# Patient Record
Sex: Male | Born: 1940 | Race: White | Hispanic: No | Marital: Married | Smoking: Former smoker
Health system: Southern US, Community
[De-identification: ages and names within clinical notes are randomized; demographics above are authoritative.]

## PROBLEM LIST (undated history)

## (undated) DIAGNOSIS — J449 Chronic obstructive pulmonary disease, unspecified: Secondary | ICD-10-CM

## (undated) DIAGNOSIS — E039 Hypothyroidism, unspecified: Secondary | ICD-10-CM

## (undated) DIAGNOSIS — J189 Pneumonia, unspecified organism: Secondary | ICD-10-CM

## (undated) DIAGNOSIS — M1990 Unspecified osteoarthritis, unspecified site: Secondary | ICD-10-CM

## (undated) DIAGNOSIS — A770 Spotted fever due to Rickettsia rickettsii: Secondary | ICD-10-CM

## (undated) DIAGNOSIS — E119 Type 2 diabetes mellitus without complications: Secondary | ICD-10-CM

## (undated) DIAGNOSIS — Z87442 Personal history of urinary calculi: Secondary | ICD-10-CM

## (undated) DIAGNOSIS — K56609 Unspecified intestinal obstruction, unspecified as to partial versus complete obstruction: Secondary | ICD-10-CM

## (undated) DIAGNOSIS — C801 Malignant (primary) neoplasm, unspecified: Secondary | ICD-10-CM

## (undated) HISTORY — PX: TONSILLECTOMY: SUR1361

---

## 2001-07-09 ENCOUNTER — Encounter: Admission: RE | Admit: 2001-07-09 | Discharge: 2001-07-09 | Payer: Self-pay | Admitting: Family Medicine

## 2001-07-09 ENCOUNTER — Encounter: Payer: Self-pay | Admitting: Family Medicine

## 2004-07-03 ENCOUNTER — Ambulatory Visit (HOSPITAL_COMMUNITY): Admission: RE | Admit: 2004-07-03 | Discharge: 2004-07-03 | Payer: Self-pay | Admitting: Cardiology

## 2004-07-03 ENCOUNTER — Ambulatory Visit: Payer: Self-pay | Admitting: Cardiology

## 2004-08-02 ENCOUNTER — Ambulatory Visit: Payer: Self-pay | Admitting: Cardiology

## 2012-01-22 ENCOUNTER — Emergency Department (HOSPITAL_COMMUNITY)
Admission: EM | Admit: 2012-01-22 | Discharge: 2012-01-23 | Disposition: A | Discharge status: Home / Self Care | Payer: Medicare Other | Attending: Emergency Medicine | Admitting: Emergency Medicine

## 2012-01-22 ENCOUNTER — Encounter (HOSPITAL_COMMUNITY): Payer: Self-pay | Admitting: *Deleted

## 2012-01-22 DIAGNOSIS — L253 Unspecified contact dermatitis due to other chemical products: Secondary | ICD-10-CM

## 2012-01-22 MED ORDER — ERYTHROMYCIN 5 MG/GM OP OINT
TOPICAL_OINTMENT | Freq: Once | OPHTHALMIC | Status: AC
  Administered 2012-01-22: 23:00:00 via OPHTHALMIC
  Filled 2012-01-22: qty 3.5

## 2012-01-22 MED ORDER — PREDNISONE 20 MG PO TABS: ORAL_TABLET | ORAL | Status: DC

## 2012-01-22 MED ORDER — PREDNISONE 20 MG PO TABS
60.0000 mg | ORAL_TABLET | Freq: Once | ORAL | Status: AC
  Administered 2012-01-22: 60 mg via ORAL
  Filled 2012-01-22: qty 3

## 2012-01-22 NOTE — ED Notes (Signed)
Pt reports pain, burning and increased tearing in eyes.  Redness and watering noted.

## 2012-01-22 NOTE — Discharge Instructions (Signed)
Contact Dermatitis Contact dermatitis is a reaction to certain substances that touch the skin. Contact dermatitis can be either irritant contact dermatitis or allergic contact dermatitis. Irritant contact dermatitis does not require previous exposure to the substance for a reaction to occur.Allergic contact dermatitis only occurs if you have been exposed to the substance before. Upon a repeat exposure, your body reacts to the substance.  CAUSES  Many substances can cause contact dermatitis. Irritant dermatitis is most commonly caused by repeated exposure to mildly irritating substances, such as:  Makeup.   Soaps.   Detergents.   Bleaches.   Acids.   Metal salts, such as nickel.  Allergic contact dermatitis is most commonly caused by exposure to:  Poisonous plants.   Chemicals (deodorants, shampoos).   Jewelry.   Latex.   Neomycin in triple antibiotic cream.   Preservatives in products, including clothing.  SYMPTOMS  The area of skin that is exposed may develop:  Dryness or flaking.   Redness.   Cracks.   Itching.   Pain or a burning sensation.   Blisters.  With allergic contact dermatitis, there may also be swelling in areas such as the eyelids, mouth, or genitals.  DIAGNOSIS  Your caregiver can usually tell what the problem is by doing a physical exam. In cases where the cause is uncertain and an allergic contact dermatitis is suspected, a patch skin test may be performed to help determine the cause of your dermatitis. TREATMENT Treatment includes protecting the skin from further contact with the irritating substance by avoiding that substance if possible. Barrier creams, powders, and gloves may be helpful. Your caregiver may also recommend:  Steroid creams or ointments applied 2 times daily. For best results, soak the rash area in cool water for 20 minutes. Then apply the medicine. Cover the area with a plastic wrap. You can store the steroid cream in the  refrigerator for a "chilly" effect on your rash. That may decrease itching. Oral steroid medicines may be needed in more severe cases.   Antibiotics or antibacterial ointments if a skin infection is present.   Antihistamine lotion or an antihistamine taken by mouth to ease itching.   Lubricants to keep moisture in your skin.   Burow's solution to reduce redness and soreness or to dry a weeping rash. Mix one packet or tablet of solution in 2 cups cool water. Dip a clean washcloth in the mixture, wring it out a bit, and put it on the affected area. Leave the cloth in place for 30 minutes. Do this as often as possible throughout the day.   Taking several cornstarch or baking soda baths daily if the area is too large to cover with a washcloth.  Harsh chemicals, such as alkalis or acids, can cause skin damage that is like a burn. You should flush your skin for 15 to 20 minutes with cold water after such an exposure. You should also seek immediate medical care after exposure. Bandages (dressings), antibiotics, and pain medicine may be needed for severely irritated skin.  HOME CARE INSTRUCTIONS  Avoid the substance that caused your reaction.   Keep the area of skin that is affected away from hot water, soap, sunlight, chemicals, acidic substances, or anything else that would irritate your skin.   Do not scratch the rash. Scratching may cause the rash to become infected.   You may take cool baths to help stop the itching.   Only take over-the-counter or prescription medicines as directed by your caregiver.     See your caregiver for follow-up care as directed to make sure your skin is healing properly.  SEEK MEDICAL CARE IF:   Your condition is not better after 3 days of treatment.   You seem to be getting worse.   You see signs of infection such as swelling, tenderness, redness, soreness, or warmth in the affected area.   You have any problems related to your medicines.  Document Released:  07/12/2000 Document Revised: 07/04/2011 Document Reviewed: 12/18/2010 [facility] Patient Information 2012 [place], [place].   Apply the antibiotic ointment given to your eyelids three times daily for the next 3-5 days (until the irritation is better).  Use the prednisone taper until completed, taking your next dose tomorrow evening.

## 2012-01-22 NOTE — ED Notes (Signed)
Pt was using an insecticide 1 pm today , Rubbed his eyes , eyes red with periorbital redness.    Storcide  Grain insecticide.

## 2012-01-22 NOTE — ED Provider Notes (Signed)
History     CSN: [id number]  Arrival date & time 01/22/12  2107   First MD Initiated Contact with Patient 01/22/12 2204      Chief Complaint  Patient presents with  . Eye Injury    (Consider location/radiation/quality/duration/timing/severity/associated sxs/prior treatment) HPI Comments: Patient was spraying  an insecticide outdoors today around 1 pm when he felt like he had a foreign body in his left eye.  He rubbed his eyes with hands,  And developed burning pain of both his eyes and also the periorbital skin.  He went indoors and flushed his eyes copiously and the eye discomfort has resolved,  But he continues to have burning pain of the skin around his eyes.  He denies any change in his visual acuity.  His eyes have been draining clear tears a little more than normal since the event.  The name of the chemical was Storcide Grain insecticide.  Patient is a 71 y.o. male presenting with eye injury. The history is provided by the patient.  Eye Injury Associated symptoms include a rash. Pertinent negatives include no abdominal pain, arthralgias, chest pain, congestion, fever, headaches, joint swelling, nausea, neck pain, numbness, sore throat or weakness.    Past Medical History  Diagnosis Date  . Thyroid disease     History reviewed. No pertinent past surgical history.  History reviewed. No pertinent family history.  History  Substance Use Topics  . Smoking status: Never Smoker   . Smokeless tobacco: Not on file  . Alcohol Use: No      Review of Systems  Constitutional: Negative for fever.  HENT: Negative for congestion, sore throat and neck pain.   Eyes: Positive for redness. Negative for visual disturbance.  Respiratory: Negative for chest tightness and shortness of breath.   Cardiovascular: Negative for chest pain.  Gastrointestinal: Negative for nausea and abdominal pain.  Genitourinary: Negative.   Musculoskeletal: Negative for joint swelling and arthralgias.    Skin: Positive for rash. Negative for wound.  Neurological: Negative for dizziness, weakness, light-headedness, numbness and headaches.  Hematological: Negative.   Psychiatric/Behavioral: Negative.     Allergies  Review of patient's allergies indicates no known allergies.  Home Medications   Current Outpatient Rx  Name Route Sig Dispense Refill  . CELECOXIB 200 MG PO CAPS Oral Take 200 mg by mouth every morning.    Marland Kitchen LEVOTHYROXINE SODIUM PO Oral Take 1 tablet by mouth every morning.    Marland Kitchen OXYMETAZOLINE HCL 0.05 % NA SOLN Nasal Place 2 sprays into the nose as needed.    Marland Kitchen TAMSULOSIN HCL 0.4 MG PO CAPS Oral Take 0.4 mg by mouth every morning.    Marland Kitchen PREDNISONE 20 MG PO TABS  6, 5, 4, 3, 2 then 1 tablet by mouth daily for 6 days total. 21 tablet 0    BP 146/90  Pulse 76  Temp 98 F (36.7 C) (Oral)  Resp 20  Ht 5\' 10"  (1.778 m)  Wt 220 lb (99.791 kg)  BMI 31.57 kg/m2  SpO2 96%  Physical Exam  Nursing note and vitals reviewed. Constitutional: He appears well-developed and well-nourished.  HENT:  Head: Normocephalic and atraumatic.  Eyes: EOM are normal. Pupils are equal, round, and reactive to light. Right eye exhibits no chemosis and no discharge. Left eye exhibits no chemosis and no discharge. Right conjunctiva is injected. Left conjunctiva is injected.       Periorbital skin slightly erythematous and irritated appearing.  PH of both eyes tested at 7.5  Visual acuity os20/25 od 20/20.  Neck: Normal range of motion.  Cardiovascular: Normal rate.   Pulmonary/Chest: Effort normal and breath sounds normal.  Musculoskeletal: Normal range of motion.  Neurological: He is alert.  Skin: Skin is warm and dry.  Psychiatric: He has a normal mood and affect.    ED Course  Procedures (including critical care time)  Labs Reviewed - No data to display No results found.   1. Contact dermatitis due to chemicals       MDM  Topical contact irritation from chemical exposure.  No  sign of ongoing injury (such as from basic chemical).  PH bilateral eyes neutral.  Discussed with Dr. Adriana Simas who also saw pt.  Given tobrex ointment to apply to periorbital skin for soothing.  Prednisone taper for inflammation.  Recheck by pcp if not better in 48 hours.       Burgess Amor, [place] 01/23/12 [phone]

## 2012-02-03 NOTE — ED Provider Notes (Signed)
Medical screening examination/treatment/procedure(s) were conducted as a shared visit with non-physician practitioner(s) and myself.  I personally evaluated the patient during the encounter.  Chemical conjunctivitis.  Normal vision  Donnetta Hutching, MD 02/03/12 [phone]

## 2013-05-28 ENCOUNTER — Other Ambulatory Visit: Payer: Self-pay | Admitting: Family Medicine

## 2013-05-28 DIAGNOSIS — R109 Unspecified abdominal pain: Secondary | ICD-10-CM

## 2013-05-28 DIAGNOSIS — R110 Nausea: Secondary | ICD-10-CM

## 2013-05-31 ENCOUNTER — Ambulatory Visit
Admission: RE | Admit: 2013-05-31 | Discharge: 2013-05-31 | Disposition: A | Discharge status: Home / Self Care | Payer: Medicare Other | Source: Ambulatory Visit | Attending: Family Medicine | Admitting: Family Medicine

## 2013-05-31 ENCOUNTER — Other Ambulatory Visit: Payer: Self-pay | Admitting: Family Medicine

## 2013-05-31 DIAGNOSIS — R110 Nausea: Secondary | ICD-10-CM

## 2013-06-22 ENCOUNTER — Other Ambulatory Visit (HOSPITAL_COMMUNITY): Payer: Self-pay | Admitting: Internal Medicine

## 2013-06-22 DIAGNOSIS — R634 Abnormal weight loss: Secondary | ICD-10-CM

## 2013-06-22 DIAGNOSIS — R109 Unspecified abdominal pain: Secondary | ICD-10-CM

## 2013-06-23 ENCOUNTER — Ambulatory Visit (HOSPITAL_COMMUNITY)
Admission: RE | Admit: 2013-06-23 | Discharge: 2013-06-23 | Disposition: A | Discharge status: Home / Self Care | Payer: Medicare Other | Source: Ambulatory Visit | Attending: Internal Medicine | Admitting: Internal Medicine

## 2013-06-23 DIAGNOSIS — R935 Abnormal findings on diagnostic imaging of other abdominal regions, including retroperitoneum: Secondary | ICD-10-CM | POA: Insufficient documentation

## 2013-06-23 DIAGNOSIS — R109 Unspecified abdominal pain: Secondary | ICD-10-CM

## 2013-06-23 DIAGNOSIS — K869 Disease of pancreas, unspecified: Secondary | ICD-10-CM | POA: Insufficient documentation

## 2013-06-23 DIAGNOSIS — R918 Other nonspecific abnormal finding of lung field: Secondary | ICD-10-CM | POA: Insufficient documentation

## 2013-06-23 DIAGNOSIS — R634 Abnormal weight loss: Secondary | ICD-10-CM

## 2013-06-23 MED ORDER — IOHEXOL 300 MG/ML  SOLN
100.0000 mL | Freq: Once | INTRAMUSCULAR | Status: AC | PRN
  Administered 2013-06-23: 100 mL via INTRAVENOUS

## 2013-06-25 LAB — POCT I-STAT CREATININE: Creatinine, Ser: 1 mg/dL (ref 0.50–1.35)

## 2013-06-28 ENCOUNTER — Other Ambulatory Visit: Payer: Self-pay | Admitting: Oncology

## 2013-06-30 ENCOUNTER — Other Ambulatory Visit: Payer: Self-pay | Admitting: Oncology

## 2013-06-30 ENCOUNTER — Other Ambulatory Visit: Payer: Self-pay

## 2013-06-30 ENCOUNTER — Telehealth: Payer: Self-pay

## 2013-06-30 ENCOUNTER — Other Ambulatory Visit (HOSPITAL_BASED_OUTPATIENT_CLINIC_OR_DEPARTMENT_OTHER): Payer: Medicare Other | Admitting: Lab

## 2013-06-30 ENCOUNTER — Encounter: Payer: Self-pay | Admitting: Oncology

## 2013-06-30 ENCOUNTER — Telehealth: Payer: Self-pay | Admitting: Oncology

## 2013-06-30 ENCOUNTER — Ambulatory Visit (HOSPITAL_BASED_OUTPATIENT_CLINIC_OR_DEPARTMENT_OTHER): Payer: Medicare Other | Admitting: Oncology

## 2013-06-30 ENCOUNTER — Encounter: Payer: Self-pay | Admitting: Gastroenterology

## 2013-06-30 ENCOUNTER — Ambulatory Visit (HOSPITAL_BASED_OUTPATIENT_CLINIC_OR_DEPARTMENT_OTHER): Payer: Medicare Other

## 2013-06-30 VITALS — BP 141/73 | HR 67 | Temp 97.5°F | Resp 20 | Ht 70.0 in | Wt 195.7 lb

## 2013-06-30 DIAGNOSIS — C259 Malignant neoplasm of pancreas, unspecified: Secondary | ICD-10-CM

## 2013-06-30 DIAGNOSIS — C252 Malignant neoplasm of tail of pancreas: Secondary | ICD-10-CM

## 2013-06-30 DIAGNOSIS — J449 Chronic obstructive pulmonary disease, unspecified: Secondary | ICD-10-CM

## 2013-06-30 DIAGNOSIS — K8689 Other specified diseases of pancreas: Secondary | ICD-10-CM

## 2013-06-30 DIAGNOSIS — Z87891 Personal history of nicotine dependence: Secondary | ICD-10-CM

## 2013-06-30 DIAGNOSIS — E039 Hypothyroidism, unspecified: Secondary | ICD-10-CM

## 2013-06-30 LAB — COMPREHENSIVE METABOLIC PANEL (CC13)
ALT: 21 U/L (ref 0–55)
AST: 23 U/L (ref 5–34)
Anion Gap: 9 mEq/L (ref 3–11)
BUN: 19.4 mg/dL (ref 7.0–26.0)
CO2: 24 mEq/L (ref 22–29)
Calcium: 9.3 mg/dL (ref 8.4–10.4)
Creatinine: 1 mg/dL (ref 0.7–1.3)
Total Bilirubin: 0.47 mg/dL (ref 0.20–1.20)

## 2013-06-30 LAB — CBC WITH DIFFERENTIAL/PLATELET
BASO%: 0.7 % (ref 0.0–2.0)
Basophils Absolute: 0.1 10*3/uL (ref 0.0–0.1)
EOS%: 1.9 % (ref 0.0–7.0)
Eosinophils Absolute: 0.2 10*3/uL (ref 0.0–0.5)
HCT: 44.7 % (ref 38.4–49.9)
HGB: 15 g/dL (ref 13.0–17.1)
LYMPH%: 30.6 % (ref 14.0–49.0)
MCH: 33.4 pg (ref 27.2–33.4)
MCHC: 33.4 g/dL (ref 32.0–36.0)
MCV: 99.8 fL — ABNORMAL HIGH (ref 79.3–98.0)
MONO%: 8.2 % (ref 0.0–14.0)
NEUT%: 58.6 % (ref 39.0–75.0)
Platelets: 212 10*3/uL (ref 140–400)
RDW: 13.4 % (ref 11.0–14.6)

## 2013-06-30 MED ORDER — HYDROCODONE-ACETAMINOPHEN 5-325 MG PO TABS: ORAL_TABLET | ORAL | Status: DC

## 2013-06-30 NOTE — Progress Notes (Signed)
[facility] NEW PATIENT EVALUATION   Name: [place] Date: 06/30/2013 MRN: [id number] DOB: 13-Mar-1941  REFERRING PHYSICIAN: Carylon Perches WU:[id number], Kipp Brood (PCP, [facility]); Christella Hartigan, D.; [place]er[place],F.   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 Staging not completed.   HISTORY OF PRESENT ILLNESS:[place] is a 72 y.o. 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 Only evaluation to this point has been CT AP done at [facility] 06-23-2013. Patient had been in his usual generally good health until routine blood work by PCP showed elevated hemoglobin A1c in ~ June 2014, after he had gradually gained weight from usual ~ 200 lbs to ~ 225 lbs. He began metformin and also strict diet, including cutting out "a gallon or more" of sweet tea and 6-8 sodas daily, + white bread and sweets. With the diet changes he lost ~ 30 lbs in 4 months, metformin DCd in Oct 2014 with hemoglobin A1c back to normal then. He noticed persistent aching in abdomen from epigastrium to below umbilicus, seen for this by PCP first 3 Fridays in Oct and tried on prilosec without improvement. Pain is not described as thru to back. Abdominal US done [facility] 05-31-13 was negative for gallstones or dilatation of common bile duct, no liver abnormalities, questioned small RUQ ascites and right renal cyst (no mention of any abnormalities of pancreas, tho [place] labeled as limited RUQ). Aleve at hs was helpful. With persistence of the discomfort, a friend referred him to Dr Carylon Perches, who had CT AP done at [facility] on 06-23-2013. The CT was concerning for an area in pancreatic tail 3.1 x 1.9 x 1.6 cm suspicious for malignancy, with remainder of pancreas unremarkable, no clear adenopathy, no significant ascites, and infiltrative changes in greater omentum  concerning for involvement. The CT also showed multiple small bilateral lung base nodules up to 6 mm as well as large simple appearing cyst of right kidney.   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 He is no longer dieting, tolerating foods including sausage and eggs without difficulty. He has been using Aleve 2 tablets at hs and also took one tablet this AM. 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   REVIEW OF SYSTEMS as above, also: No HA. Good vision with reading glasses. Minimal environmental allergies. No difficulty hearing. No dental problems. Thyroid medication has been stable x years. No SOB with regular activities, no cough, no chest pain. No cardiac symptoms. No frank GERD. Some belching, new. Bowels moving normally. Voids well with Flomax, tho stream slow without this. Some knee discomfort not changed, no other arthritis. No blood clots. No other pain.   Remainder of full 10 point review of systems negative.   ALLERGIES: Review of patient's allergies indicates no known allergies.  PAST MEDICAL/ SURGICAL HISTORY:    Hypothyroid x years, on stable dose levothyroxine RMSF in ~ June 2014, treated Metformin June - Oct 2014 Nephrolithiasis x1 years ago, saw urologist in [place] Long past tobacco No surgery No hospitalizations No transfusions No colonoscopy Did have flu vaccine this fall  CURRENT MEDICATIONS: reviewed as listed now in EMR. Discussed taking Aleve always with food. Prior to this MD speaking with GI directly, I also told him to hold Aleve before EUS bx. Prescription given for hydrocodone APAP  5-325 1-2 q 4-6 hr prn #30. He has had flu vaccine.   SOCIAL HISTORY:  Originally from [place] where he lives with wife; son and daughter also in [place]. Cigarettes <= 1 ppd x 50 years DCd age 24. No ETOH, no drugs. 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 Granddaughter age 40.   FAMILY  HISTORY:  Father living (alone) age 50. Heart disease Mother died age 35 No siblings 2 children healthy          PHYSICAL EXAM:  height is 5\' 10"  (1.778 m) and weight is 195 lb 11.2 oz (88.769 kg). His oral temperature is 97.5 F (36.4 C). His blood pressure is 141/73 and his pulse is 67. His respiration is 20.  WDWN gentleman appears stated age, easily mobile, good historian, does not appear in discomfort. Not jaundiced, not cachectic. Alert, pleasant, cooperative, fully oriented and appropriate. Family very supportive.  HEENT: PERRL, not icteric. Oral mucosa moist and clear, posterior pharynx also. Neck supple without JVD or thyroid mass.  RESPIRATORY: Clear to percussion, breath sounds somewhat diminished bilaterally otherwise clear. No wheeze or rales. No use of accessory muscles.  CARDIAC/ VASCULAR: RRR, no murmur or gallop  ABDOMEN: Soft, not tender, not distended, bowel sounds present and normally active, no appreciable HSM or mass.  LYMPH NODES: no cervical, supraclavicular, right axillary adenopathy. Left axilla with 1.5 cm mobile lower node, not firm.  BREASTS: no gynecomastia  NEUROLOGIC: CN, motor, sensory, cerebellar nonfocal. Psych appears unremarkable  SKIN:no rash, ecchymosis, petechiae  MUSCULOSKELETAL: LE no edema, cords, tenderness. UE no clubbing. Back not tender to palpation. Not cachectic    LABORATORY DATA:  Results for orders placed in visit on 06/30/13 (from the past 48 hour(s))  CBC WITH DIFFERENTIAL     Status: Abnormal   Collection Time    06/30/13 10:21 AM      Result Value Range   WBC 9.7  4.0 - 10.3 10e3/uL   NEUT# 5.7  1.5 - 6.5 10e3/uL   HGB 15.0  13.0 - 17.1 g/dL   HCT 95.6  21.3 - 08.6 %   Platelets 212  140 - 400 10e3/uL   MCV 99.8 (*) 79.3 - 98.0 fL   MCH 33.4  27.2 - 33.4 pg   MCHC 33.4  32.0 - 36.0 g/dL   RBC 5.78  4.69 - 6.29 10e6/uL   RDW 13.4  11.0 - 14.6 %   lymph# 3.0  0.9 - 3.3 10e3/uL   MONO# 0.8  0.1 - 0.9 10e3/uL    Eosinophils Absolute 0.2  0.0 - 0.5 10e3/uL   Basophils Absolute 0.1  0.0 - 0.1 10e3/uL   NEUT% 58.6  39.0 - 75.0 %   LYMPH% 30.6  14.0 - 49.0 %   MONO% 8.2  0.0 - 14.0 %   EOS% 1.9  0.0 - 7.0 %   BASO% 0.7  0.0 - 2.0 %  COMPREHENSIVE METABOLIC PANEL (CC13)     Status: None   Collection Time    06/30/13 10:21 AM      Result Value Range   Sodium 141  136 - 145 mEq/L   Potassium 4.1  3.5 - 5.1 mEq/L   Chloride 109  98 - 109 mEq/L   CO2 24  22 - 29 mEq/L   Glucose 120  70 - 140 mg/dl   BUN 52.8  7.0 - 41.3 mg/dL   Creatinine 1.0  0.7 - 1.3 mg/dL   Total Bilirubin 2.44  0.20 -  1.20 mg/dL   Alkaline Phosphatase 91  40 - 150 U/L   AST 23  5 - 34 U/L   ALT 21  0 - 55 U/L   Total Protein 7.0  6.4 - 8.3 g/dL   Albumin 3.6  3.5 - 5.0 g/dL   Calcium 9.3  8.4 - 16.1 mg/dL   Anion Gap 9  3 - 11 mEq/L   CA 19-9 available after visit  391  PATHOLOGY: none to date   RADIOGRAPHY: CT ABDOMEN AND PELVIS WITH CONTRAST 06-23-13 .  COMPARISON: None  FINDINGS:  Minimal dependent atelectasis at lung bases.  Multiple small bilateral lung base lung nodules up to 6 mm diameter  worrisome for metastases.  Large simple appearing cyst at upper pole right kidney 10.7 x 8.9 x  9.6 cm.  Additional smaller bilateral renal cysts.  Liver, spleen, and adrenal glands normal appearance.  Abnormal area of inhomogeneous enhancement at the pancreatic tail  highly worrisome for a pancreatic neoplasm, area in question  approximately 3.1 x 1.9 x 1.6 cm.  Remainder of pancreas normal appearance.  No definite peripancreatic adenopathy.  Infiltrative changes are seen at the greater omentum worrisome for  peritoneal carcinomatosis, note images 31, 40, 44 and 48.  No significant ascites.  No urinary tract calcification or dilatation.  Few small normal-sized periaortic lymph nodes.  No definite adenopathy.  Stomach incompletely distended unable to exclude gastric wall  thickening.  Large and small bowel loops  normal appearance.  Appendix not visualized.  Vertebral hemangiomata at L1 and L2.  Multilevel scattered degenerative disc disease changes.  IMPRESSION:  Poorly defined pancreatic tail mass 3.1 x 1.9 x 1.6 cm highly  worrisome for a pancreatic neoplasm.  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  Suspected pulmonary metastases at lung bases.   :  US ABDOMEN LIMITED - RIGHT UPPER QUADRANT 05-31-2013 COMPARISON: None.  FINDINGS:  Gallbladder  Negative for gallstones, wall thickening or Murphy's sign. Wall  thickness measures 2.4 mm. Incidental 5 mm gallbladder polyp  present.  Common bile duct  Diameter: 3.5 mm diameter. No dilatation or obstruction  Liver:  18.8 cm in length. Normal echogenicity. No focal abnormality. No  intrahepatic biliary dilatation. Patent portal vein with normal  hepatopetal flow. Question small amount of right upper quadrant  perihepatic free fluid or ascites.  Included imaging of the right kidney demonstrates a 10.7 cm  exophytic hypoechoic renal cyst.  IMPRESSION:  Negative for gallstones or acute finding.  No biliary dilatation  Question small amount of right upper quadrant perihepatic free fluid  or ascites, nonspecific  10.7 cm right upper pole renal cyst   DISCUSSION: Prior to patient's visit, I discussed with my partner in GI oncology; situation also communicated with Dr Wendall Papa, who has kindly agreed to work him in for EUS/bx next available appointment. I have spoken also with Dr Christella Hartigan' assistant to coordinate instructions for that appointment and procedure. I have reviewed all of history with patient and family together. We have discussed work up of suspected pancreatic cancer, particularly to try to rule in or out metastatic disease, with decision re biopsy and possible surgery depending on that information. We have discussed challenges in doing biopsies and surgery for pancreatic  cancer; fortunately he appears clinically to be surgical candidate if this is appropriate. We have mentioned chemotherapy in general as consideration either prior to surgery or in case of metastatic disease. We have discussed findings on CT AP, including  size of pancreatic tail mass, lack of obvious adenopathy, stranding in omentum and nodules in lung bases of unclear etiology. He needs CT chest; I will defer any additional abdominal imaging to surgeon. [facility], which he may want to discuss with Dr Ouida Sills also. At completion of conversation, patient and family are in agreement with referral for CT chest, to Dr Christella Hartigan and to Dr Donell Beers, tho they may elect second opinion additionally.  Next multidisciplinary GI conference is 07-14-13, patient to be discussed then, with whatever additional information is available by that time.     IMPRESSION / PLAN:   1.Mass in tail of pancreas with elevated CA 19-9: staging not clear as yet, but clinically consistent with pancreatic cancer in patient who is minimaly symptomatic with pain only. Will get CT chest, EUS/bx by Dr Christella Hartigan and consultation with Dr Donell Beers. I have put him back to see me in ~ 10 days, but can adjust that if needed depending on other appointments. 2.long past tobacco with reported COPD. Basilar lung nodules on CT AP without comparison scan. For CT chest. 3.nephrolithiasis once: no stones seen on recent CT. Large simple right renal cyst. 4.apparent prostatic hypertrophy, symptoms improved with Flomax 5.intentional weight loss of 30 lbs in 4 months after diabetes diagnosis in June 2014. Weight now stable and hemoglobin A1c normal 6.hypothyroid x years, on replacement 7.no colonoscopy 8.vertebral hemangiomata L1 and L2. Scattered degenerative disc disease 9.flu vaccine done   Patient and family members have had all questions answered to their satisfaction and are in agreement with plan above. They can contact  this office for questions or concerns at any time prior to next scheduled visit.  Time spent 60 min , including >50% discussion and coordination of care.    LIVESAY,LENNIS P, MD 06/30/2013 12:40 PM

## 2013-06-30 NOTE — Telephone Encounter (Signed)
Left message on machine to call back  

## 2013-06-30 NOTE — Progress Notes (Signed)
Checked in new patient with no financial issues and she he has his appt card.

## 2013-06-30 NOTE — Telephone Encounter (Signed)
Message copied by Donata Duff on Wed Jun 30, 2013 10:17 AM ------      Message from: Rob Bunting P      Created: Wed Jun 30, 2013  9:57 AM       I can get this done next Thursday at the latest.  Tamu Golz, can you work on booking upper EUS, radial +/- linear, next Thursday, + MAC, to follow my last EUS already scheduled.  Dx; pancreatic mass.            Thanks                  ----- Message -----         From: Cira Rue, RN         Sent: 06/30/2013   9:11 AM           To: Rachael Fee, MD, Donata Duff, CMA, #            Hi Dr. Christella Hartigan,            Just want to give you a "heads up" re: this patient who has a pancreatic mass in the tail of the pancreas.  He is seeing Dr. Darrold Span today at 11:00.  She will probably be referring him to you for a biopsy after she has seen him.      How soon would you be able to perform an EUS?  Knowing this will help Dr. Darrold Span in her planning.            Thank you,      Vicie Mutters       ------

## 2013-06-30 NOTE — Patient Instructions (Signed)
Hydrocodone/ acetaminophen   5/325 mg:  1-2 tablets every 4-6 hrs as needed for pain.  Use in preference to aleve before biopsy.  Stool softener such as Colace, or Senokot S (or pharmacy equivalent) fine if constipation with hydrocodone   Call if questions or concerns prior to next scheduled visit    [phone]

## 2013-06-30 NOTE — Telephone Encounter (Signed)
EUS scheduled, pt instructed and medications reviewed.  Patient instructions mailed to home.  Patient to call with any questions or concerns.  

## 2013-07-01 ENCOUNTER — Ambulatory Visit (HOSPITAL_COMMUNITY)
Admission: RE | Admit: 2013-07-01 | Discharge: 2013-07-01 | Disposition: A | Discharge status: Home / Self Care | Payer: Medicare Other | Source: Ambulatory Visit | Attending: Oncology | Admitting: Oncology

## 2013-07-01 ENCOUNTER — Encounter: Payer: Self-pay | Admitting: Gastroenterology

## 2013-07-01 ENCOUNTER — Encounter (HOSPITAL_COMMUNITY): Payer: Self-pay | Admitting: *Deleted

## 2013-07-01 ENCOUNTER — Telehealth: Payer: Self-pay

## 2013-07-01 ENCOUNTER — Encounter (HOSPITAL_COMMUNITY): Payer: Self-pay | Admitting: Pharmacy Technician

## 2013-07-01 DIAGNOSIS — I700 Atherosclerosis of aorta: Secondary | ICD-10-CM | POA: Insufficient documentation

## 2013-07-01 DIAGNOSIS — N281 Cyst of kidney, acquired: Secondary | ICD-10-CM | POA: Insufficient documentation

## 2013-07-01 DIAGNOSIS — R918 Other nonspecific abnormal finding of lung field: Secondary | ICD-10-CM | POA: Insufficient documentation

## 2013-07-01 DIAGNOSIS — C259 Malignant neoplasm of pancreas, unspecified: Secondary | ICD-10-CM

## 2013-07-01 DIAGNOSIS — I2510 Atherosclerotic heart disease of native coronary artery without angina pectoris: Secondary | ICD-10-CM | POA: Insufficient documentation

## 2013-07-01 DIAGNOSIS — K8689 Other specified diseases of pancreas: Secondary | ICD-10-CM | POA: Insufficient documentation

## 2013-07-01 MED ORDER — IOHEXOL 300 MG/ML  SOLN
80.0000 mL | Freq: Once | INTRAMUSCULAR | Status: AC | PRN
  Administered 2013-07-01: 80 mL via INTRAVENOUS

## 2013-07-01 NOTE — Telephone Encounter (Signed)
Miss Chestnut wanted to know if her husband could eat prior to ct scan today.  Told her he cannot eat or drink anything after 1000 until scan is completed.  He has not eaten sinc breakfast which was prior to 1000. She also  wanted the results of the CA 19-9 drawn yesterday.    Told her that it was 391 and the normal value is <35.  Told Ms. Handyside that Dr. Darrold Span has to review this lab result along with CT and biopsy results to interprerate results which will be discussed at the next visit.

## 2013-07-02 ENCOUNTER — Telehealth (INDEPENDENT_AMBULATORY_CARE_PROVIDER_SITE_OTHER): Payer: Self-pay

## 2013-07-02 ENCOUNTER — Encounter: Payer: Self-pay | Admitting: Oncology

## 2013-07-02 NOTE — Progress Notes (Signed)
Medical Oncology  Reviewed CT chest from 07-01-13 with interventional radiologist, who feels that biopsy of lung nodules would likely be difficult. He mentions that biopsy of omentum in RUQ might be easier if that would be helpful.  Ila Mcgill, MD

## 2013-07-02 NOTE — Telephone Encounter (Signed)
LMOV pt's appt rescheduled from 07/16/13 to 07/12/13 at 1:30 pm with Dr. Donell Beers.

## 2013-07-03 ENCOUNTER — Other Ambulatory Visit: Payer: Self-pay | Admitting: Oncology

## 2013-07-05 ENCOUNTER — Encounter: Payer: Self-pay | Admitting: Oncology

## 2013-07-05 ENCOUNTER — Telehealth: Payer: Self-pay | Admitting: Oncology

## 2013-07-05 ENCOUNTER — Ambulatory Visit (HOSPITAL_BASED_OUTPATIENT_CLINIC_OR_DEPARTMENT_OTHER): Payer: Medicare Other | Admitting: Oncology

## 2013-07-05 ENCOUNTER — Telehealth: Payer: Self-pay

## 2013-07-05 VITALS — BP 147/81 | HR 62 | Temp 97.8°F | Resp 18 | Ht 70.0 in | Wt 199.0 lb

## 2013-07-05 DIAGNOSIS — Z87891 Personal history of nicotine dependence: Secondary | ICD-10-CM

## 2013-07-05 DIAGNOSIS — R918 Other nonspecific abnormal finding of lung field: Secondary | ICD-10-CM

## 2013-07-05 DIAGNOSIS — C252 Malignant neoplasm of tail of pancreas: Secondary | ICD-10-CM

## 2013-07-05 DIAGNOSIS — I700 Atherosclerosis of aorta: Secondary | ICD-10-CM

## 2013-07-05 NOTE — Telephone Encounter (Signed)
Son calling for the results of the CT Chest  from 06-30-13.

## 2013-07-05 NOTE — Progress Notes (Signed)
OFFICE PROGRESS NOTE   07/05/2013   Physicians: Carylon Perches; Marjory Lies; Devoria Glassing  INTERVAL HISTORY:  Patient is seen, together with wife, son and daughter, as work in visit today due to concerning findings on CT chest and elevated CA 19-9, as we continue work up of apparent pancreatic cancer. They are all understandably very anxious and have many questions such that this discussion needed to be done in person. Prior to visit today, I have reviewed the CT with interventional radiologist, who does not feel that the pulmonary nodules could be easily or accurately biopsied due to small size. I have discussed situation with my partner in GI oncology, who agrees with diagnostic biopsy, then likely beginning treatment with chemotherapy. Dr Christella Hartigan is also aware and agrees with proceeding with EUS biopsy on 07-08-13 as planned. Patient is to see Dr Donell Beers on 12-15 and will be presented at GI oncology conference on 07-14-13. I spoke with wife about the CT findings by phone earlier today as they had requested.  Albert Richards has had no new or different problems since he was here on 07-02-13. He has tried 2 doses of the hydrocodone, which seems more helpful than NSAID, and he denies any discomfort presently.  The CT chest done 07-01-13, without priors for comparison, shows multiple pulmonary nodules throughout lungs bilaterally, largest in RLL 1.2 cm. Majority are described as solid with indistinct borders and distribution also concerning for metastatic involvement. No adenopathy, consolidative airspace disease, or pleural effusion. The mass in tail of pancreas measures slightly larger on this scan at 3.2 x 2.4 cm. I have discussed this information with patient and family now and we have reviewed images on PACS.  CA 19-9 from 07-01-13 resulted at 391.    ONCOLOGIC HISTORY Patient had been in his usual generally good health until routine blood work by PCP showed elevated hemoglobin A1c in  ~ June 2014, after he had gradually gained weight from usual ~ 200 lbs to ~ 225 lbs. He began metformin and also strict diet, including cutting out "a gallon or more" of sweet tea and 6-8 sodas daily, + white bread and sweets. With the diet changes he lost ~ 30 lbs in 4 months, metformin DCd in Oct 2014 with hemoglobin A1c back to normal then. He noticed persistent aching in abdomen from epigastrium to below umbilicus, seen for this by PCP first 3 Fridays in Oct and tried on prilosec without improvement. Pain is not described as thru to back. Abdominal US done [facility] 05-31-13 was negative for gallstones or dilatation of common bile duct, no liver abnormalities, questioned small RUQ ascites and right renal cyst (no mention of any abnormalities of pancreas, tho [place] labeled as limited RUQ). Aleve at hs was helpful. With persistence of the discomfort, a friend referred him to Dr Carylon Perches, who had CT AP done at [facility] on 06-23-2013. The CT was concerning for an area in pancreatic tail 3.1 x 1.9 x 1.6 cm suspicious for malignancy, with remainder of pancreas unremarkable, no clear adenopathy, no significant ascites, and infiltrative changes in greater omentum concerning for involvement. The CT also showed multiple small bilateral lung base nodules up to 6 mm as well as large simple appearing cyst of right kidney   Review of systems as above, also: Appetite and energy at baseline. No SOB, cough, chest pain. Remainder of 10 point Review of Systems negative.  Objective:  Vital signs in last 24 hours:  BP 147/81  Pulse 62  Temp(Src) 97.8 F (36.6 C) (Oral)  Resp 18  Ht 5\' 10"  (1.778 m)  Wt 199 lb (90.266 kg)  BMI 28.55 kg/m2  Alert, oriented and appropriate. Easily ambulatory, looks comfortable. Not icteric. Respirations not labored RA.  Exam otherwise not repeated today. s  Lab Results:  Results for orders placed in visit on 06/30/13  CBC WITH DIFFERENTIAL      Result Value Range    WBC 9.7  4.0 - 10.3 10e3/uL   NEUT# 5.7  1.5 - 6.5 10e3/uL   HGB 15.0  13.0 - 17.1 g/dL   HCT 16.1  09.6 - 04.5 %   Platelets 212  140 - 400 10e3/uL   MCV 99.8 (*) 79.3 - 98.0 fL   MCH 33.4  27.2 - 33.4 pg   MCHC 33.4  32.0 - 36.0 g/dL   RBC 4.09  8.11 - 9.14 10e6/uL   RDW 13.4  11.0 - 14.6 %   lymph# 3.0  0.9 - 3.3 10e3/uL   MONO# 0.8  0.1 - 0.9 10e3/uL   Eosinophils Absolute 0.2  0.0 - 0.5 10e3/uL   Basophils Absolute 0.1  0.0 - 0.1 10e3/uL   NEUT% 58.6  39.0 - 75.0 %   LYMPH% 30.6  14.0 - 49.0 %   MONO% 8.2  0.0 - 14.0 %   EOS% 1.9  0.0 - 7.0 %   BASO% 0.7  0.0 - 2.0 %  COMPREHENSIVE METABOLIC PANEL (CC13)      Result Value Range   Sodium 141  136 - 145 mEq/L   Potassium 4.1  3.5 - 5.1 mEq/L   Chloride 109  98 - 109 mEq/L   CO2 24  22 - 29 mEq/L   Glucose 120  70 - 140 mg/dl   BUN 78.2  7.0 - 95.6 mg/dL   Creatinine 1.0  0.7 - 1.3 mg/dL   Total Bilirubin 2.13  0.20 - 1.20 mg/dL   Alkaline Phosphatase 91  40 - 150 U/L   AST 23  5 - 34 U/L   ALT 21  0 - 55 U/L   Total Protein 7.0  6.4 - 8.3 g/dL   Albumin 3.6  3.5 - 5.0 g/dL   Calcium 9.3  8.4 - 08.6 mg/dL   Anion Gap 9  3 - 11 mEq/L  CANCER ANTIGEN 19-9      Result Value Range   CA 19-9 391.0 (*) <35.0 U/mL     Studies/Results:  CT CHEST WITH CONTRAST  TECHNIQUE:  Multidetector CT imaging of the chest was performed during  intravenous contrast administration.  CONTRAST: 80mL OMNIPAQUE IOHEXOL 300 MG/ML SOLN  COMPARISON: CT of the abdomen and pelvis 06/23/2013.  FINDINGS:  Mediastinum: Heart size is normal. There is no significant  pericardial fluid, thickening or pericardial calcification. There is  atherosclerosis of the thoracic aorta, the great vessels of the  mediastinum and the coronary arteries, including calcified  atherosclerotic plaque in the left anterior descending and right  coronary arteries. No pathologically enlarged mediastinal or hilar  lymph nodes. Esophagus is unremarkable in appearance.   Lungs/Pleura: Again noted are innumerable randomly distributed  pulmonary nodules scattered throughout the lungs bilaterally,  largest of which is in the superior segment of the right lower lobe  (image 26 of series 5) measuring 1.2 cm in diameter. The  distribution of these nodules demonstrates a definite craniocaudal  gradient, highly suspicious for metastatic disease. The majority of  these nodules are solid appearing with indistinct borders, overall a  highly aggressive appearance. No acute consolidative airspace  disease. No pleural effusions.  Upper Abdomen: Ill-defined hypovascular mass in the distal body of  the pancreas redemonstrated, measuring approximately 3.2 x 2.4 cm on  today's examination. Pancreatic ductal dilatation in the pancreatic  tail distal to the lesion, and there is some atrophy of the tail.  Large cyst in the upper pole of the right kidney incompletely  imaged.  Musculoskeletal: There are no aggressive appearing lytic or blastic  lesions noted in the visualized portions of the skeleton.  IMPRESSION:  1. Innumerable pulmonary nodules scattered throughout the lungs  bilaterally, highly concerning for metastatic disease, as detailed  above.  2. Hypovascular mass in the distal body of the pancreas appears  slightly larger than the prior study, currently measuring 3.2 x 2.4  cm, as above.  3. Atherosclerosis, including left anterior descending and right  coronary artery disease.  4. Additional incidental findings, as above.     Medications: I have reviewed the patient's current medications.  DISCUSSION: I have explained that the initial evaluation of a presumed pancreatic cancer is diagnostic + determination of whether or not surgery in curative attempt is possible. I have explained that removing the primary site of disease in the pancreas would not be curative if there is distant metastatic disease, and that the surgery can be very difficult. We have  discussed in detail the findings on CT chest, which are concerning for metastatic cancer from pancreas to lung, tho we cannot be 100% certain of this without pathologic confirmation. The lung nodules are too small for interventional radiology to biopsy and are too small for PET to be useful; he probably could have procedure by thoracic surgery to biopsy the lung nodules, however I doubt this would change initial approach of treatment and might well delay start of treatment (chemotherapy). I mentioned that IR may be able to biopsy omentum, however I will defer this consideration now to Dr Donell Beers, as I am not sure again that would make a difference in treatment. The patient and family likely will want to discuss all of this further with Drs Christella Hartigan and Donell Beers. They have mentioned second opinion consultation, which certainly can be arranged if they wish. We have mentioned using chemotherapy in neoadjuvant fashion for locally advanced disease prior to attempting surgery, tho this would not be the case for distant metastatic disease. I have told them clearly that metastatic disease to lung would not be a situation of potential cure.  I have mentioned PAC for chemotherapy and chemo education class, neither of which should cause much delay if we decide to begin with chemotherapy. Note patient would prefer treatment at [facility] or [place] if appropriate. They are aware that [facility] oncology coverage is with interim physicians now and that [facility] will have full time physicians in next few weeks.   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 For EUS evaluation with biopsy by Dr Christella Hartigan 07-08-13 and consultation with Dr Donell Beers upcoming. I will reschedule next visit here for 12-22. 2.long past tobacco. No prior chest CT for comparison.  3.history of  nephrolithiasis: no stones seen on recent CT. Large simple right renal cyst.  4.apparent prostatic hypertrophy, symptoms improved with Flomax  5.intentional weight loss of 30 lbs in 4 months after diabetes diagnosis in June 2014. 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 They are aware that they can call at any time prior to next scheduled visit if they have other questions or concerns. Patient and wife expressed appreciation for efforts thus far. Time spent 35 min in face to face discussion and coordination of care.   LIVESAY,Albert P, MD   07/05/2013, 5:52 PM

## 2013-07-05 NOTE — Telephone Encounter (Signed)
Medical Oncology  Spoke with wife by phone as family had requested, re ca 19-9 result from 06-30-13 and in general about CT chest from 07-01-13. I have offered to meet with them this afternoon @ 4:30, as this information is better shared directly. Wife agrees with meeting this afternoon.  Ila Mcgill, MD

## 2013-07-06 ENCOUNTER — Telehealth: Payer: Self-pay | Admitting: Oncology

## 2013-07-06 NOTE — Telephone Encounter (Signed)
sw pt spouse Nyoka Lint of 12/22 appts per 12/8 POF shh

## 2013-07-07 DIAGNOSIS — I700 Atherosclerosis of aorta: Secondary | ICD-10-CM | POA: Insufficient documentation

## 2013-07-08 ENCOUNTER — Encounter (HOSPITAL_COMMUNITY): Payer: Self-pay | Admitting: *Deleted

## 2013-07-08 ENCOUNTER — Ambulatory Visit (HOSPITAL_COMMUNITY): Payer: Medicare Other | Admitting: Anesthesiology

## 2013-07-08 ENCOUNTER — Ambulatory Visit (HOSPITAL_COMMUNITY)
Admission: RE | Admit: 2013-07-08 | Discharge: 2013-07-08 | Disposition: A | Discharge status: Home / Self Care | Payer: Medicare Other | Source: Ambulatory Visit | Attending: Gastroenterology | Admitting: Gastroenterology

## 2013-07-08 ENCOUNTER — Encounter (HOSPITAL_COMMUNITY): Payer: Medicare Other | Admitting: Anesthesiology

## 2013-07-08 ENCOUNTER — Encounter (HOSPITAL_COMMUNITY)
Admission: RE | Disposition: A | Discharge status: Home / Self Care | Payer: Self-pay | Source: Ambulatory Visit | Attending: Gastroenterology

## 2013-07-08 DIAGNOSIS — N400 Enlarged prostate without lower urinary tract symptoms: Secondary | ICD-10-CM | POA: Insufficient documentation

## 2013-07-08 DIAGNOSIS — N281 Cyst of kidney, acquired: Secondary | ICD-10-CM | POA: Insufficient documentation

## 2013-07-08 DIAGNOSIS — I700 Atherosclerosis of aorta: Secondary | ICD-10-CM | POA: Insufficient documentation

## 2013-07-08 DIAGNOSIS — K869 Disease of pancreas, unspecified: Secondary | ICD-10-CM

## 2013-07-08 DIAGNOSIS — I2510 Atherosclerotic heart disease of native coronary artery without angina pectoris: Secondary | ICD-10-CM | POA: Insufficient documentation

## 2013-07-08 DIAGNOSIS — C252 Malignant neoplasm of tail of pancreas: Secondary | ICD-10-CM | POA: Insufficient documentation

## 2013-07-08 DIAGNOSIS — K8689 Other specified diseases of pancreas: Secondary | ICD-10-CM

## 2013-07-08 DIAGNOSIS — R918 Other nonspecific abnormal finding of lung field: Secondary | ICD-10-CM | POA: Insufficient documentation

## 2013-07-08 DIAGNOSIS — M899 Disorder of bone, unspecified: Secondary | ICD-10-CM | POA: Insufficient documentation

## 2013-07-08 HISTORY — DX: Personal history of urinary calculi: Z87.442

## 2013-07-08 HISTORY — PX: EUS: SHX5427

## 2013-07-08 HISTORY — DX: Hypothyroidism, unspecified: E03.9

## 2013-07-08 SURGERY — UPPER ENDOSCOPIC ULTRASOUND (EUS) LINEAR
Anesthesia: Monitor Anesthesia Care

## 2013-07-08 MED ORDER — LACTATED RINGERS IV SOLN
INTRAVENOUS | Status: DC | PRN
  Administered 2013-07-08: 13:00:00 via INTRAVENOUS

## 2013-07-08 MED ORDER — PROPOFOL 10 MG/ML IV BOLUS
INTRAVENOUS | Status: AC
  Filled 2013-07-08: qty 20

## 2013-07-08 MED ORDER — KETAMINE HCL 10 MG/ML IJ SOLN
INTRAMUSCULAR | Status: DC | PRN
  Administered 2013-07-08 (×2): 10 mg via INTRAVENOUS

## 2013-07-08 MED ORDER — PROPOFOL INFUSION 10 MG/ML OPTIME
INTRAVENOUS | Status: DC | PRN
  Administered 2013-07-08: 140 ug/kg/min via INTRAVENOUS

## 2013-07-08 NOTE — H&P (View-Only) (Signed)
OFFICE PROGRESS NOTE   07/05/2013   Physicians: Fagan, Roy; Burnett, Brent; Jacobs, Daniel; Byerly, Faera  INTERVAL HISTORY:  Patient is seen, together with wife, son and daughter, as work in visit today due to concerning findings on CT chest and elevated CA 19-9, as we continue work up of apparent pancreatic cancer. They are all understandably very anxious and have many questions such that this discussion needed to be done in person. Prior to visit today, I have reviewed the CT with interventional radiologist, who does not feel that the pulmonary nodules could be easily or accurately biopsied due to small size. I have discussed situation with my partner in GI oncology, who agrees with diagnostic biopsy, then likely beginning treatment with chemotherapy. Dr Jacobs is also aware and agrees with proceeding with EUS biopsy on 07-08-13 as planned. Patient is to see Dr Byerly on 12-15 and will be presented at GI oncology conference on 07-14-13. I spoke with wife about the CT findings by phone earlier today as they had requested.  Albert Richards has had no new or different problems since he was here on 07-02-13. He has tried 2 doses of the hydrocodone, which seems more helpful than NSAID, and he denies any discomfort presently.  The CT chest done 07-01-13, without priors for comparison, shows multiple pulmonary nodules throughout lungs bilaterally, largest in RLL 1.2 cm. Majority are described as solid with indistinct borders and distribution also concerning for metastatic involvement. No adenopathy, consolidative airspace disease, or pleural effusion. The mass in tail of pancreas measures slightly larger on this scan at 3.2 x 2.4 cm. I have discussed this information with patient and family now and we have reviewed images on PACS.  CA 19-9 from 07-01-13 resulted at 391.    ONCOLOGIC HISTORY Patient had been in his usual generally good health until routine blood work by PCP showed elevated hemoglobin A1c in  ~ June 2014, after he had gradually gained weight from usual ~ 200 lbs to ~ 225 lbs. He began metformin and also strict diet, including cutting out "a gallon or more" of sweet tea and 6-8 sodas daily, + white bread and sweets. With the diet changes he lost ~ 30 lbs in 4 months, metformin DCd in Oct 2014 with hemoglobin A1c back to normal then. He noticed persistent aching in abdomen from epigastrium to below umbilicus, seen for this by PCP first 3 Fridays in Oct and tried on prilosec without improvement. Pain is not described as thru to back. Abdominal US done [facility] 05-31-13 was negative for gallstones or dilatation of common bile duct, no liver abnormalities, questioned small RUQ ascites and right renal cyst (no mention of any abnormalities of pancreas, tho [place] labeled as limited RUQ). Aleve at hs was helpful. With persistence of the discomfort, a friend referred him to Dr Roy Fagan, who had CT AP done at [place] on 06-23-2013. The CT was concerning for an area in pancreatic tail 3.1 x 1.9 x 1.6 cm suspicious for malignancy, with remainder of pancreas unremarkable, no clear adenopathy, no significant ascites, and infiltrative changes in greater omentum concerning for involvement. The CT also showed multiple small bilateral lung base nodules up to 6 mm as well as large simple appearing cyst of right kidney   Review of systems as above, also: Appetite and energy at baseline. No SOB, cough, chest pain. Remainder of 10 point Review of Systems negative.  Objective:  Vital signs in last 24 hours:  BP 147/81  Pulse 62    Temp(Src) 97.8 F (36.6 C) (Oral)  Resp 18  Ht 5' 10" (1.778 m)  Wt 199 lb (90.266 kg)  BMI 28.55 kg/m2  Alert, oriented and appropriate. Easily ambulatory, looks comfortable. Not icteric. Respirations not labored RA.  Exam otherwise not repeated today. s  Lab Results:  Results for orders placed in visit on 06/30/13  CBC WITH DIFFERENTIAL      Result Value Range    WBC 9.7  4.0 - 10.3 10e3/uL   NEUT# 5.7  1.5 - 6.5 10e3/uL   HGB 15.0  13.0 - 17.1 g/dL   HCT 44.7  38.4 - 49.9 %   Platelets 212  140 - 400 10e3/uL   MCV 99.8 (*) 79.3 - 98.0 fL   MCH 33.4  27.2 - 33.4 pg   MCHC 33.4  32.0 - 36.0 g/dL   RBC 4.48  4.20 - 5.82 10e6/uL   RDW 13.4  11.0 - 14.6 %   lymph# 3.0  0.9 - 3.3 10e3/uL   MONO# 0.8  0.1 - 0.9 10e3/uL   Eosinophils Absolute 0.2  0.0 - 0.5 10e3/uL   Basophils Absolute 0.1  0.0 - 0.1 10e3/uL   NEUT% 58.6  39.0 - 75.0 %   LYMPH% 30.6  14.0 - 49.0 %   MONO% 8.2  0.0 - 14.0 %   EOS% 1.9  0.0 - 7.0 %   BASO% 0.7  0.0 - 2.0 %  COMPREHENSIVE METABOLIC PANEL (CC13)      Result Value Range   Sodium 141  136 - 145 mEq/L   Potassium 4.1  3.5 - 5.1 mEq/L   Chloride 109  98 - 109 mEq/L   CO2 24  22 - 29 mEq/L   Glucose 120  70 - 140 mg/dl   BUN 19.4  7.0 - 26.0 mg/dL   Creatinine 1.0  0.7 - 1.3 mg/dL   Total Bilirubin 0.47  0.20 - 1.20 mg/dL   Alkaline Phosphatase 91  40 - 150 U/L   AST 23  5 - 34 U/L   ALT 21  0 - 55 U/L   Total Protein 7.0  6.4 - 8.3 g/dL   Albumin 3.6  3.5 - 5.0 g/dL   Calcium 9.3  8.4 - 10.4 mg/dL   Anion Gap 9  3 - 11 mEq/L  CANCER ANTIGEN 19-9      Result Value Range   CA 19-9 391.0 (*) <35.0 U/mL     Studies/Results:  CT CHEST WITH CONTRAST  TECHNIQUE:  Multidetector CT imaging of the chest was performed during  intravenous contrast administration.  CONTRAST: 80mL OMNIPAQUE IOHEXOL 300 MG/ML SOLN  COMPARISON: CT of the abdomen and pelvis 06/23/2013.  FINDINGS:  Mediastinum: Heart size is normal. There is no significant  pericardial fluid, thickening or pericardial calcification. There is  atherosclerosis of the thoracic aorta, the great vessels of the  mediastinum and the coronary arteries, including calcified  atherosclerotic plaque in the left anterior descending and right  coronary arteries. No pathologically enlarged mediastinal or hilar  lymph nodes. Esophagus is unremarkable in appearance.   Lungs/Pleura: Again noted are innumerable randomly distributed  pulmonary nodules scattered throughout the lungs bilaterally,  largest of which is in the superior segment of the right lower lobe  (image 26 of series 5) measuring 1.2 cm in diameter. The  distribution of these nodules demonstrates a definite craniocaudal  gradient, highly suspicious for metastatic disease. The majority of  these nodules are solid appearing with indistinct borders, overall a    highly aggressive appearance. No acute consolidative airspace  disease. No pleural effusions.  Upper Abdomen: Ill-defined hypovascular mass in the distal body of  the pancreas redemonstrated, measuring approximately 3.2 x 2.4 cm on  today's examination. Pancreatic ductal dilatation in the pancreatic  tail distal to the lesion, and there is some atrophy of the tail.  Large cyst in the upper pole of the right kidney incompletely  imaged.  Musculoskeletal: There are no aggressive appearing lytic or blastic  lesions noted in the visualized portions of the skeleton.  IMPRESSION:  1. Innumerable pulmonary nodules scattered throughout the lungs  bilaterally, highly concerning for metastatic disease, as detailed  above.  2. Hypovascular mass in the distal body of the pancreas appears  slightly larger than the prior study, currently measuring 3.2 x 2.4  cm, as above.  3. Atherosclerosis, including left anterior descending and right  coronary artery disease.  4. Additional incidental findings, as above.     Medications: I have reviewed the patient's current medications.  DISCUSSION: I have explained that the initial evaluation of a presumed pancreatic cancer is diagnostic + determination of whether or not surgery in curative attempt is possible. I have explained that removing the primary site of disease in the pancreas would not be curative if there is distant metastatic disease, and that the surgery can be very difficult. We have  discussed in detail the findings on CT chest, which are concerning for metastatic cancer from pancreas to lung, tho we cannot be 100% certain of this without pathologic confirmation. The lung nodules are too small for interventional radiology to biopsy and are too small for PET to be useful; he probably could have procedure by thoracic surgery to biopsy the lung nodules, however I doubt this would change initial approach of treatment and might well delay start of treatment (chemotherapy). I mentioned that IR may be able to biopsy omentum, however I will defer this consideration now to Dr Byerly, as I am not sure again that would make a difference in treatment. The patient and family likely will want to discuss all of this further with Drs Jacobs and Byerly. They have mentioned second opinion consultation, which certainly can be arranged if they wish. We have mentioned using chemotherapy in neoadjuvant fashion for locally advanced disease prior to attempting surgery, tho this would not be the case for distant metastatic disease. I have told them clearly that metastatic disease to lung would not be a situation of potential cure.  I have mentioned PAC for chemotherapy and chemo education class, neither of which should cause much delay if we decide to begin with chemotherapy. Note patient would prefer treatment at [place] or [place] if appropriate. They are aware that [place] oncology coverage is with interim physicians now and that [place] will have full time physicians in next few weeks.   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 For EUS evaluation with biopsy by Dr Jacobs 07-08-13 and consultation with Dr Byerly upcoming. I will reschedule next visit here for 12-22. 2.long past tobacco. No prior chest CT for comparison.  3.history of  nephrolithiasis: no stones seen on recent CT. Large simple right renal cyst.  4.apparent prostatic hypertrophy, symptoms improved with Flomax  5.intentional weight loss of 30 lbs in 4 months after diabetes diagnosis in June 2014. 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 They are aware that they can call at any time prior to next scheduled visit if they have other questions or concerns. Patient and wife expressed appreciation for efforts thus far. Time spent 35 min in face to face discussion and coordination of care.   Kaloni Bisaillon P, MD   07/05/2013, 5:52 PM    

## 2013-07-08 NOTE — Transfer of Care (Signed)
Immediate Anesthesia Transfer of Care Note  Patient: Albert Richards  Procedure(s) Performed: Procedure(s): UPPER ENDOSCOPIC ULTRASOUND (EUS) LINEAR (N/A)  Patient Location: PACU and Endoscopy Unit  Anesthesia Type:MAC  Level of Consciousness: sedated  Airway & Oxygen Therapy: Patient Spontanous Breathing and Patient connected to nasal cannula oxygen  Post-op Assessment: Report given to PACU RN and Post -op Vital signs reviewed and stable  Post vital signs: Reviewed and stable  Complications: No apparent anesthesia complications

## 2013-07-08 NOTE — Op Note (Signed)
[facility] [address] [place], [address]   ENDOSCOPIC ULTRASOUND PROCEDURE REPORT  PATIENT: Albert Richards, Albert Richards  MR#: [id number] BIRTHDATE: Nov 22, 1940  GENDER: Male ENDOSCOPIST: Rachael Fee, MD REFERRED BY:  Jama Flavors, M.D. PROCEDURE DATE:  07/08/2013 PROCEDURE:   Upper EUS w/FNA ASA CLASS:      Class II INDICATIONS:   mass in tail of pancreas. MEDICATIONS: MAC sedation, administered by CRNA  DESCRIPTION OF PROCEDURE:   After the risks benefits and alternatives of the procedure were  explained, informed consent was obtained. The patient was then placed in the left, lateral, decubitus postion and IV sedation was administered. Throughout the procedure, the patients blood pressure, pulse and oxygen saturations were monitored continuously.  Under direct visualization, the Pentax Radial EUS [id number]  endoscope was introduced through the mouth  and advanced to the second portion of the duodenum .  Water was used as necessary to provide an acoustic interface.  Upon completion of the imaging, water was removed and the patient was sent to the recovery room in satisfactory condition.   Endoscopic findings: 1. Normal UGI tract  EUS findings: 1. There was a 2.7cm by 3cm solid, hypoechoic, irregularly bordered mass in tail of pancreas that intimately involves splenic vessels but no other major vascular structions (the mass does not involve Celiac Trunk, SMA, SMV, PV).  This was sampled with two transgastric passes with a 25 gauge EUS FNA needle, using suction. 2. The remaining pancreatic parenchyma was normal. 3. No peripancreatic adenopathy. 4. CBD was normal, non-dilated 5. Limited views of liver, spleen were normal.  Impression: 2.7cm by 3cm mass in tail of pancreas that involves splenic vessels but no other signficant vascular structions.  Preliminary cytology reivew shows malignant cells (adenocarcinoma).  He has appointment already scheduled with  Dr. Donell Beers next week.  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   _______________________________ eSignedRachael Fee, MD 07/08/2013 3:26 PM

## 2013-07-08 NOTE — Interval H&P Note (Signed)
History and Physical Interval Note:  07/08/2013 1:25 PM  Albert Richards  has presented today for surgery, with the diagnosis of pancreatic mass  The various methods of treatment have been discussed with the patient and family. After consideration of risks, benefits and other options for treatment, the patient has consented to  Procedure(s): UPPER ENDOSCOPIC ULTRASOUND (EUS) LINEAR (N/A) as a surgical intervention .  The patient's history has been reviewed, patient examined, no change in status, stable for surgery.  I have reviewed the patient's chart and labs.  Questions were answered to the patient's satisfaction.     Rachael Fee

## 2013-07-08 NOTE — Anesthesia Postprocedure Evaluation (Signed)
  Anesthesia Post-op Note  Patient: Albert Richards  Procedure(s) Performed: Procedure(s) (LRB): UPPER ENDOSCOPIC ULTRASOUND (EUS) LINEAR (N/A)  Patient Location: PACU  Anesthesia Type: General  Level of Consciousness: awake and alert   Airway and Oxygen Therapy: Patient Spontanous Breathing  Post-op Pain: mild  Post-op Assessment: Post-op Vital signs reviewed, Patient's Cardiovascular Status Stable, Respiratory Function Stable, Patent Airway and No signs of Nausea or vomiting  Last Vitals:  Filed Vitals:   07/08/13 1526  BP: 146/75  Temp: 36.9 C  Resp: 15    Post-op Vital Signs: stable   Complications: No apparent anesthesia complications

## 2013-07-08 NOTE — Anesthesia Preprocedure Evaluation (Signed)
Anesthesia Evaluation  Patient identified by MRN, date of birth, ID band Patient awake    Reviewed: Allergy & Precautions, H&P , NPO status , Patient's Chart, lab work & pertinent test results  Airway Mallampati: II TM Distance: >3 FB Neck ROM: Full    Dental no notable dental hx.    Pulmonary COPDformer smoker,  breath sounds clear to auscultation  Pulmonary exam normal       Cardiovascular Rhythm:Regular Rate:Normal - Friction Rub    Neuro/Psych negative neurological ROS  negative psych ROS   GI/Hepatic negative GI ROS, Neg liver ROS,   Endo/Other  Hypothyroidism   Renal/GU negative Renal ROS  negative genitourinary   Musculoskeletal negative musculoskeletal ROS (+)   Abdominal   Peds negative pediatric ROS (+)  Hematology negative hematology ROS (+)   Anesthesia Other Findings   Reproductive/Obstetrics negative OB ROS                           Anesthesia Physical Anesthesia Plan  ASA: II  Anesthesia Plan: MAC   Post-op Pain Management:    Induction:   Airway Management Planned:   Additional Equipment:   Intra-op Plan:   Post-operative Plan:   Informed Consent: I have reviewed the patients History and Physical, chart, labs and discussed the procedure including the risks, benefits and alternatives for the proposed anesthesia with the patient or authorized representative who has indicated his/her understanding and acceptance.   Dental advisory given  Plan Discussed with: CRNA  Anesthesia Plan Comments:         Anesthesia Quick Evaluation

## 2013-07-09 ENCOUNTER — Encounter (HOSPITAL_COMMUNITY): Payer: Self-pay | Admitting: Gastroenterology

## 2013-07-09 ENCOUNTER — Ambulatory Visit: Payer: Medicare Other | Admitting: Oncology

## 2013-07-09 ENCOUNTER — Telehealth: Payer: Self-pay | Admitting: Gastroenterology

## 2013-07-09 DIAGNOSIS — C259 Malignant neoplasm of pancreas, unspecified: Secondary | ICD-10-CM

## 2013-07-09 NOTE — Telephone Encounter (Signed)
The patients son would like to know if Dr Christella Hartigan could schedule the PET scan that he mentioned after the EUS.  Dr Christella Hartigan I did not see that mentioned on the report.  Do you want the pt to have this done?

## 2013-07-09 NOTE — Telephone Encounter (Signed)
Left message on machine to call back  

## 2013-07-09 NOTE — Telephone Encounter (Signed)
07/20/13 11 am 1045 arrival NPO 6 hours prior [facility] Dr Christella Hartigan is this to far out?

## 2013-07-09 NOTE — Telephone Encounter (Signed)
Yes, I think PET scan is a very good next step (for lung findings as well as ? Peritoneal carcinomatosis).  Please order PET/CT for pancreatic cancer staging.

## 2013-07-12 ENCOUNTER — Ambulatory Visit (INDEPENDENT_AMBULATORY_CARE_PROVIDER_SITE_OTHER): Payer: Medicare Other | Admitting: General Surgery

## 2013-07-12 ENCOUNTER — Encounter (INDEPENDENT_AMBULATORY_CARE_PROVIDER_SITE_OTHER): Payer: Self-pay | Admitting: General Surgery

## 2013-07-12 VITALS — BP 170/96 | HR 72 | Temp 98.6°F | Resp 14 | Ht 70.0 in | Wt 199.6 lb

## 2013-07-12 DIAGNOSIS — C259 Malignant neoplasm of pancreas, unspecified: Secondary | ICD-10-CM

## 2013-07-12 NOTE — Patient Instructions (Signed)
We will process referrals to [facility] and [facility].  We will also discuss you at conference.  Follow up to be determined.

## 2013-07-12 NOTE — Telephone Encounter (Signed)
That is ok 

## 2013-07-12 NOTE — Telephone Encounter (Signed)
Pt has been instructed and will call with any further questions

## 2013-07-13 ENCOUNTER — Other Ambulatory Visit (INDEPENDENT_AMBULATORY_CARE_PROVIDER_SITE_OTHER): Payer: Self-pay | Admitting: General Surgery

## 2013-07-13 ENCOUNTER — Telehealth: Payer: Self-pay | Admitting: Gastroenterology

## 2013-07-13 DIAGNOSIS — C259 Malignant neoplasm of pancreas, unspecified: Secondary | ICD-10-CM

## 2013-07-13 NOTE — Telephone Encounter (Signed)
Candice was notified that [facility] referred for surgery and should call that office

## 2013-07-13 NOTE — Assessment & Plan Note (Addendum)
The patient is not a candidate for resection since he has what appears to be metastatic disease. There are innumerable pulmonary nodules and nodules at the base of the lungs. He also appears to have peritoneal implants on CT scan.  The patient seems to be in a bit of denial about the diagnosis. He and his family do not want to accept that he has stage IV disease without a biopsy of a distant metastasis. Advised him that I would recommend chemotherapy. I encouraged him to seek additional diagnoses from a tertiary care center as well as a nationally recognized center.  He and his family would like to pursue referral at [facility] and at [facility].  I do not know if he would be a candidate for aggressive trials given his age.  However he does have a very good functional status. 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    I discussed with the patient briefly the role of Port-A-Cath placement. I would not schedule the surgery until I am sure he is going to require it. I discussed that he may need celiac block or palliative radiation if his abdominal pain were to worsen. However, I'm concerned that some of the abdominal pain may be from peritoneal implants are not benefit from celiac block. He is not currently taking narcotics for the pain.  He has family are to pursue potential enrollment in trials if he is a candidate. I encouraged this.  I spent 45 min in evaluation, examination, counseling and coordination of care.  >50% of this time was in counseling.

## 2013-07-13 NOTE — Progress Notes (Signed)
Chief Complaint  Patient presents with  . New Evaluation    eval pancreatic tail mass    HISTORY: Patient is a 72-year-old male who presents with new diagnosis of pancreatic cancer in the tail.  He developed diabetes in June. He deliberately cut out significant quantities of sugar in his diet and lost 30 pounds. The diabetes resolved. However, in October he started developing upper abdominal pain. This was first present to be reflux and was treated with Prilosec twice a day. He then received a right upper quadrant ultrasound which was negative for gallstones. He was informed that because of the weight loss, he may be experiencing abdominal pain secondary to this.  He decided to switch primary care physicians and upon seeing the new one, a CT scan was performed which demonstrated the pancreatic tail mass. He subsequently has undergone endoscopic ultrasound which demonstrated a 3.2 cm mass "intimately involving the splenic vessels."  Abdominal CT was concerning for potential metastases at the base of the lungs.  He subsequently underwent chest CT which demonstrated innumerable pulmonary nodules highly suspicious for metastases. He continues to have the upper abdominal pain. He denies continued weight loss. He denies diarrhea. He has not been taking narcotics for the discomfort. He has been taking one to 2 Aleve per day.  Past Medical History  Diagnosis Date  . Hypothyroidism   . History of kidney stones 10-12 yrs ago    Past Surgical History  Procedure Laterality Date  . Tonsillectomy  as child  . Eus N/A 07/08/2013    Procedure: UPPER ENDOSCOPIC ULTRASOUND (EUS) LINEAR;  Surgeon: Daniel P Jacobs, MD;  Location: [facility] ENDOSCOPY;  Service: Endoscopy;  Laterality: N/A;    Current Outpatient Prescriptions  Medication Sig Dispense Refill  . docusate sodium (COLACE) 100 MG capsule Take 100 mg by mouth daily.      . HYDROcodone-acetaminophen (NORCO/VICODIN) 5-325 MG per tablet Take 1-2 tablets every  4-6 hours as needed for pain  30 tablet  0  . levothyroxine (SYNTHROID, LEVOTHROID) 150 MCG tablet Take 150 mcg by mouth daily before breakfast.      . naproxen sodium (ANAPROX) 220 MG tablet Take 220 mg by mouth 2 (two) times daily with a meal.      . Tamsulosin HCl (FLOMAX) 0.4 MG CAPS Take 0.4 mg by mouth every morning.       No current facility-administered medications for this visit.     No Known Allergies   History reviewed. No pertinent family history.   History   Social History  . Marital Status: Married    Spouse Name: N/A    Number of Children: N/A  . Years of Education: N/A   Social History Main Topics  . Smoking status: Former Smoker -- 1.00 packs/day for 50 years    Types: Cigarettes    Start date: 07/30/1955    Quit date: 07/29/2005  . Smokeless tobacco: Never Used  . Alcohol Use: No  . Drug Use: No  . Sexual Activity: None   REVIEW OF SYSTEMS - PERTINENT POSITIVES ONLY: 12 point review of systems negative other than HPI and PMH except for abdominal pain.  EXAM: Filed Vitals:   07/12/13 1326  BP: 170/96  Pulse: 72  Temp: 98.6 F (37 C)  Resp: 14   Filed Weights   07/12/13 1326  Weight: 199 lb 9.6 oz (90.538 kg)     Gen:  No acute distress.  Well nourished and well groomed.   Neurological: Alert and oriented to   person, place, and time. Coordination normal.  Head: Normocephalic and atraumatic. Abrasion to tip of nose.  Pt thinks related to previous skin cancer site that he scratched.   Eyes: Conjunctivae are normal. Pupils are equal, round, and reactive to light. No scleral icterus.  Neck: Normal range of motion. Neck supple. No tracheal deviation or thyromegaly present.  Cardiovascular: Normal rate, regular rhythm, normal heart sounds and intact distal pulses.  Exam reveals no gallop and no friction rub.  No murmur heard. Respiratory: Effort normal.  No respiratory distress. No chest wall tenderness. Breath sounds normal.  No wheezes, rales or  rhonchi.  GI: Soft. Bowel sounds are normal. The abdomen is soft and nontender.  There is no rebound and no guarding.  Musculoskeletal: Normal range of motion. Extremities are nontender.  Lymphadenopathy: No cervical, preauricular, postauricular or axillary adenopathy is present Skin: Skin is warm and dry. No rash noted. No diaphoresis. No erythema. No pallor. No clubbing, cyanosis, or edema.   Psychiatric: Normal mood and affect. Behavior is normal. Judgment and thought content normal.    LABORATORY RESULTS: Available labs are reviewed  Ca 19-9 391 CMET normal CBC normal  pathology Diagnosis FINE NEEDLE ASPIRATION: NEEDLE ASPIRATION, ENDOSCOPIC, PANCREAS TAIL(SPECIMEN 1 OF 1 COLLECTED 07/08/13): MALIGNANT CELLS AND TUMOR NECROSIS, CONSISTENT WITH ADENOCARCINOMA.  RADIOLOGY RESULTS: See E-Chart or I-Site for most recent results.  Images and reports are reviewed. CT chest 12/4 IMPRESSION:  1. Innumerable pulmonary nodules scattered throughout the lungs  bilaterally, highly concerning for metastatic disease, as detailed  above.  2. Hypovascular mass in the distal body of the pancreas appears  slightly larger than the prior study, currently measuring 3.2 x 2.4  cm, as above.  3. Atherosclerosis, including left anterior descending and right  coronary artery disease.  4. Additional incidental findings, as above.  CT abd/pelvis 11/26 IMPRESSION:  Poorly defined pancreatic tail mass 3.1 x 1.9 x 1.6 cm highly  worrisome for a pancreatic neoplasm.  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  Suspected pulmonary metastases at lung bases.     ASSESSMENT AND PLAN: Stage IV adenocarcinoma of pancreas The patient is not a candidate for resection since he has what appears to be metastatic disease. There are innumerable pulmonary nodules and nodules at the base of the lungs. He also appears to have peritoneal implants on CT  scan.  The patient seems to be in a bit of denial about the diagnosis. He and his family do not want to accept that he has stage IV disease without a biopsy of a distant metastasis. Advised him that I would recommend chemotherapy. I encouraged him to seek additional diagnoses from a tertiary care center as well as a nationally recognized center.  He and his family would like to pursue referral at [facility] and at [facility].  I do not know if he would be a candidate for aggressive trials given his age.  However he does have a very good functional status. 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    I discussed with the patient briefly the role of Port-A-Cath placement. I would not schedule the surgery until I am sure he is going to require it. I discussed that he may need celiac block or palliative radiation if his abdominal pain were to worsen. However, I'm concerned that some of the abdominal pain may be from peritoneal implants are not benefit from celiac block. He is not currently taking narcotics for the   pain.  He has family are to pursue potential enrollment in trials if he is a candidate. I encouraged this.  I spent 45 min in evaluation, examination, counseling and coordination of care.  >50% of this time was in counseling.       Izabelle Daus L Timera Windt MD Surgical Oncology, General and Endocrine Surgery [facility]      Visit Diagnoses: 1. Stage IV adenocarcinoma of pancreas     Primary Care Physician: FAGAN,ROY, MD  Onc : [facility]:  Jacobs.    

## 2013-07-14 ENCOUNTER — Telehealth: Payer: Self-pay | Admitting: Oncology

## 2013-07-14 NOTE — Telephone Encounter (Signed)
Faxed pt medical records to [facility] °

## 2013-07-16 ENCOUNTER — Ambulatory Visit (INDEPENDENT_AMBULATORY_CARE_PROVIDER_SITE_OTHER): Payer: Self-pay | Admitting: General Surgery

## 2013-07-19 ENCOUNTER — Telehealth: Payer: Self-pay | Admitting: Oncology

## 2013-07-19 ENCOUNTER — Telehealth: Payer: Self-pay

## 2013-07-19 ENCOUNTER — Other Ambulatory Visit: Payer: Medicare Other

## 2013-07-19 ENCOUNTER — Other Ambulatory Visit: Payer: Self-pay | Admitting: Oncology

## 2013-07-19 ENCOUNTER — Ambulatory Visit: Payer: Medicare Other | Admitting: Oncology

## 2013-07-19 DIAGNOSIS — C259 Malignant neoplasm of pancreas, unspecified: Secondary | ICD-10-CM

## 2013-07-19 NOTE — Telephone Encounter (Signed)
Faxed pt medical records to [facility]. Slides will be fedex'ed. Pt aware to hand carry cd to appt.

## 2013-07-19 NOTE — Telephone Encounter (Signed)
Message copied by Lorine Bears on Mon Jul 19, 2013 12:13 PM ------      Message from: Reece Packer      Created: Sun Jul 18, 2013 11:31 AM       Is for PET 12-23 @1100 .      Please call him before LL visit on 12-22 (2:15 lab, 2:45 MD) if possible, and offer to move LL visit until 12-23 @ 3PM or 12-24, which would let us review the PET at the visit.       If he wants to keep 12-22 as scheduled, that is ok.      thanks ------

## 2013-07-19 NOTE — Telephone Encounter (Signed)
Spoke with Mrs. Albert Richards and she said that they would reschedule appointment to 07-20-13 at 3 pm.  They will arrive at 1430 to check in and possible lab work.

## 2013-07-20 ENCOUNTER — Ambulatory Visit (HOSPITAL_COMMUNITY)
Admission: RE | Admit: 2013-07-20 | Discharge: 2013-07-20 | Disposition: A | Discharge status: Home / Self Care | Payer: Medicare Other | Source: Ambulatory Visit | Attending: Gastroenterology | Admitting: Gastroenterology

## 2013-07-20 ENCOUNTER — Encounter (HOSPITAL_COMMUNITY): Payer: Self-pay | Admitting: Radiology

## 2013-07-20 ENCOUNTER — Other Ambulatory Visit (HOSPITAL_BASED_OUTPATIENT_CLINIC_OR_DEPARTMENT_OTHER): Payer: Medicare Other

## 2013-07-20 ENCOUNTER — Ambulatory Visit (HOSPITAL_BASED_OUTPATIENT_CLINIC_OR_DEPARTMENT_OTHER): Payer: Medicare Other | Admitting: Oncology

## 2013-07-20 VITALS — BP 172/76 | HR 60 | Temp 97.3°F | Resp 18 | Ht 70.0 in | Wt 202.6 lb

## 2013-07-20 DIAGNOSIS — C252 Malignant neoplasm of tail of pancreas: Secondary | ICD-10-CM

## 2013-07-20 DIAGNOSIS — R1031 Right lower quadrant pain: Secondary | ICD-10-CM

## 2013-07-20 DIAGNOSIS — C786 Secondary malignant neoplasm of retroperitoneum and peritoneum: Secondary | ICD-10-CM | POA: Insufficient documentation

## 2013-07-20 DIAGNOSIS — N281 Cyst of kidney, acquired: Secondary | ICD-10-CM | POA: Insufficient documentation

## 2013-07-20 DIAGNOSIS — R918 Other nonspecific abnormal finding of lung field: Secondary | ICD-10-CM | POA: Insufficient documentation

## 2013-07-20 DIAGNOSIS — D1809 Hemangioma of other sites: Secondary | ICD-10-CM | POA: Insufficient documentation

## 2013-07-20 DIAGNOSIS — C259 Malignant neoplasm of pancreas, unspecified: Secondary | ICD-10-CM

## 2013-07-20 DIAGNOSIS — R188 Other ascites: Secondary | ICD-10-CM | POA: Insufficient documentation

## 2013-07-20 DIAGNOSIS — M47814 Spondylosis without myelopathy or radiculopathy, thoracic region: Secondary | ICD-10-CM | POA: Insufficient documentation

## 2013-07-20 DIAGNOSIS — M47817 Spondylosis without myelopathy or radiculopathy, lumbosacral region: Secondary | ICD-10-CM | POA: Insufficient documentation

## 2013-07-20 LAB — COMPREHENSIVE METABOLIC PANEL (CC13)
AST: 15 U/L (ref 5–34)
Anion Gap: 8 mEq/L (ref 3–11)
BUN: 20.4 mg/dL (ref 7.0–26.0)
Calcium: 8.8 mg/dL (ref 8.4–10.4)
Chloride: 107 mEq/L (ref 98–109)
Creatinine: 0.9 mg/dL (ref 0.7–1.3)
Glucose: 177 mg/dl — ABNORMAL HIGH (ref 70–140)
Total Bilirubin: 0.4 mg/dL (ref 0.20–1.20)

## 2013-07-20 LAB — CBC WITH DIFFERENTIAL/PLATELET
Basophils Absolute: 0.1 10*3/uL (ref 0.0–0.1)
EOS%: 3.1 % (ref 0.0–7.0)
Eosinophils Absolute: 0.3 10*3/uL (ref 0.0–0.5)
HCT: 43.5 % (ref 38.4–49.9)
HGB: 14.5 g/dL (ref 13.0–17.1)
LYMPH%: 36.2 % (ref 14.0–49.0)
MCH: 33.2 pg (ref 27.2–33.4)
MCHC: 33.3 g/dL (ref 32.0–36.0)
MCV: 99.6 fL — ABNORMAL HIGH (ref 79.3–98.0)
MONO%: 6.7 % (ref 0.0–14.0)
NEUT%: 53.2 % (ref 39.0–75.0)
Platelets: 199 10*3/uL (ref 140–400)

## 2013-07-20 MED ORDER — FLUDEOXYGLUCOSE F - 18 (FDG) INJECTION
18.0000 | Freq: Once | INTRAVENOUS | Status: AC | PRN
Start: 2013-07-20 — End: 2013-07-20

## 2013-07-20 MED ORDER — AMITRIPTYLINE HCL 25 MG PO TABS: 25.0000 mg | ORAL_TABLET | Freq: Every day | ORAL | Status: DC

## 2013-07-20 MED ORDER — HYDROCODONE-ACETAMINOPHEN 5-325 MG PO TABS: ORAL_TABLET | ORAL | Status: DC

## 2013-07-20 NOTE — Progress Notes (Signed)
OFFICE PROGRESS NOTE   07/20/2013   Physicians:Fagan, Rudolpho Sevin, Elon Spanner   INTERVAL HISTORY:  Patient is seen, together with wife and daughter, in continuing attention to recently diagnosed pancreatic cancer, to discuss results of PET scan done earlier today, as well as his additional evalution by Drs.Christella Hartigan and [place] and his consultation visit at [facility], all done since he was last at this office on 07-05-13. I have been kept updated by Dr Christella Hartigan thru EMR and have discussed directly with Dr Donell Beers on 07-14-13. Today's PET was ordered by Dr Christella Hartigan and my appointment rescheduled to this afternoon to be able to give him timely results of the PET. I have received and reviewed consultation report from [facility] just prior to today's visit. I have also discussed PET by phone directly with radiologist just prior to seeing patient today, with addendum to that report available by conclusion of his visit today (see below). At request of patient's son, Dr Donell Beers also has made referral to [facility] for consultation, however their first available appointment is late January.   Since I saw Albert Richards last, he had endoscopic [place] by Dr Christella Hartigan 07-08-13 with 2.7 x 3 cm hypoechoic solid mass in tail of pancreas which was sampled, CBD normal, no peripancreatic adenopathy, limited views of liver and spleen normal. Cytology ([id number]) confirmed adenocarcinoma. He was seen by Dr Donell Beers 07-13-13, who also felt that he was not candidate for surgical resection due to apparent pulmonary and peritoneal metastatic disease; she had extensive discussion with patient and family, including consideration of treatment on trials and referrals for second opinion consultations; she is glad to assist with [facility] placement if that is needed. Patient was seen by GI oncology at [facility] within the past week, with full review of all of his information and recommendation also for treatment with chemotherapy, their preference  being gemzar taxotere off study or same chemo +/- a phase II agent vs placebo, which would require treatment be done at [facility].  Patient has some intermittent, mild-moderate abdominal discomfort, which is most noticeable RLQ. He is not having the discomfort now, does not obviously have low back symptoms associated, tho he has degenerative changes in spine on scans. He recalls having used a heavy drill braced into upper abdomen prior to scans that showed the soft tissue stranding changes. He has used prn hydrocodone rather than NSAID in case additional biopsy or procedure needed; hydrocodone is helpful. He is having difficulty sleeping, wakens ~ 0300, which wakens his wife, then neither can go back to sleep. 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    ONCOLOGIC HISTORY Patient had been in his usual generally good health until routine blood work by PCP showed elevated hemoglobin A1c in ~ June 2014, after he had gradually gained weight from usual ~ 200 lbs to ~ 225 lbs. He began metformin and also strict diet, including cutting out "a gallon or more" of sweet tea and 6-8 sodas daily, + white bread and sweets. With the diet changes he lost ~ 30 lbs in 4 months, metformin DCd in Oct 2014 with hemoglobin A1c back to normal then. He noticed persistent aching in abdomen from epigastrium to below umbilicus, seen for this by PCP first 3 Fridays in Oct and tried on prilosec without improvement. Pain is not described as thru to back. Abdominal US done [facility] 05-31-13 was negative for gallstones or dilatation of common bile duct, no liver abnormalities, questioned small  RUQ ascites and right renal cyst (no mention of any abnormalities of pancreas, tho [place] labeled as limited RUQ). Aleve at hs was helpful. With persistence of the discomfort, a friend referred him to Dr Carylon Perches, who had CT AP done at [facility] on 06-23-2013. The CT was concerning for an area in pancreatic tail 3.1 x  1.9 x 1.6 cm suspicious for malignancy, with remainder of pancreas unremarkable, no clear adenopathy, no significant ascites, and infiltrative changes in greater omentum concerning for involvement. The CT also showed multiple small bilateral lung base nodules up to 6 mm as well as large simple appearing cyst of right kidney. CT chest 07-01-13 showed innumerable scattered pulmonary nodules bilaterally, largest in RLL 1.2 cm, majority of the nodules solid appearing with indistinct margins felt by radiologist to be highly aggressive appearance, but also felt too small for IR biopsy and only 1-2 of these large enough to possibly show on PET. He had endoscopic [place] by Dr Christella Hartigan 07-08-13 with 2.7 x 3 cm hypoechoic solid mass in tail of pancreas which was sampled, CBD normal, no peripancreatic adenopathy, limited views of liver and spleen normal. Cytology ([id number]) had adenocarcinoma. He was seen by Dr Donell Beers 07-13-13, who also felt that he was not candidate for surgical resection due to apparent pulmonary and peritoneal metastatic disease; she had extensive discussion with patient and family, including consideration of treatment on trials and referrals for second opinion consultations; she is glad to assist with [facility] placement if that is needed. Patient was seen by GI oncology at [facility] within the past week, with full review of all of his information and recommendation also for treatment with chemotherapy, their preference being gemzar taxotere off study or same chemo +/- a phase II agent vs placebo, which would require treatment be done at [facility].   Review of systems as above, also: No fever, no cough, bowels ok, voiding adequately with usual flomax. Remainder of 10 point Review of Systems negative.  Objective:  Vital signs in last 24 hours:  BP 172/76  Pulse 60  Temp(Src) 97.3 F (36.3 C) (Oral)  Resp 18  Ht 5\' 10"  (1.778 m)  Wt 202 lb 9.6 oz (91.899 kg)  BMI 29.07 kg/m2  Alert, oriented and appropriate.  Ambulatory without difficulty. 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 No pedal edema.  Exam not repeated otherwise, with entirely of this visit spent in counseling and coordination of care.   Lab Results:  Results for orders placed in visit on 07/20/13  CBC WITH DIFFERENTIAL      Result Value Range   WBC 8.4  4.0 - 10.3 10e3/uL   NEUT# 4.5  1.5 - 6.5 10e3/uL   HGB 14.5  13.0 - 17.1 g/dL   HCT 41.3  24.4 - 01.0 %   Platelets 199  140 - 400 10e3/uL   MCV 99.6 (*) 79.3 - 98.0 fL   MCH 33.2  27.2 - 33.4 pg   MCHC 33.3  32.0 - 36.0 g/dL   RBC 2.72  5.36 - 6.44 10e6/uL   RDW 13.2  11.0 - 14.6 %   lymph# 3.0  0.9 - 3.3 10e3/uL   MONO# 0.6  0.1 - 0.9 10e3/uL   Eosinophils Absolute 0.3  0.0 - 0.5 10e3/uL   Basophils Absolute 0.1  0.0 - 0.1 10e3/uL   NEUT% 53.2  39.0 - 75.0 %   LYMPH% 36.2  14.0 - 49.0 %   MONO% 6.7  0.0 - 14.0 %   EOS% 3.1  0.0 -  7.0 %   BASO% 0.8  0.0 - 2.0 %  COMPREHENSIVE METABOLIC PANEL (CC13)      Result Value Range   Sodium 142  136 - 145 mEq/L   Potassium 4.0  3.5 - 5.1 mEq/L   Chloride 107  98 - 109 mEq/L   CO2 27  22 - 29 mEq/L   Glucose 177 (*) 70 - 140 mg/dl   BUN 16.1  7.0 - 09.6 mg/dL   Creatinine 0.9  0.7 - 1.3 mg/dL   Total Bilirubin 0.45  0.20 - 1.20 mg/dL   Alkaline Phosphatase 81  40 - 150 U/L   AST 15  5 - 34 U/L   ALT 15  0 - 55 U/L   Total Protein 6.7  6.4 - 8.3 g/dL   Albumin 3.4 (*) 3.5 - 5.0 g/dL   Calcium 8.8  8.4 - 40.9 mg/dL   Anion Gap 8  3 - 11 mEq/L     Studies/Results:  Nm Pet Image Initial (pi) Skull Base To Thigh  07/20/2013   ADDENDUM REPORT: 07/20/2013 15:29  ADDENDUM: Comparison with the CT from 06/23/2013 and re- review of today's PET-CT demonstrates a malignant range FDG uptake associated with the areas of soft tissue infiltration involving the omentum. Index area of omental metastasis is identified along the undersurface of the ventral abdominal wall. This measures 4.2 cm and has an SUV max equal to 3.0. Within the jejunal  mesentery there is a soft tissue attenuating nodule measuring 1.9 cm. The SUV max associated with this is equal 2.6, image 159. A small amount of ascites is identified within the dependent portion of the pelvis. Focal area of soft tissue attenuation within the cul-de-sac measures 1.6 cm and is suspicious for a peritoneal nodule.  IMPRESSION: 1. Multifocal areas of abnormal soft tissue infiltration involving the abdominal peritoneum is worrisome for peritoneal carcinomatosis.   Electronically Signed   By: Signa Kell M.D.   On: 07/20/2013 15:29   07/20/2013   CLINICAL DATA:  Initial treatment strategy for pancreas cancer.  EXAM: NUCLEAR MEDICINE PET SKULL BASE TO THIGH  FASTING BLOOD GLUCOSE:  Value: 97mg /dl  TECHNIQUE: 81.1 mCi B-14 FDG was injected intravenously. CT data was obtained and used for attenuation correction and anatomic localization only. (This was not acquired as a diagnostic CT examination.) Additional exam technical data entered on technologist worksheet.  COMPARISON:  None.  FINDINGS: NECK  No hypermetabolic lymph nodes in the neck.  CHEST  Again noted are multi focal pulmonary parenchymal nodules in both lungs. The largest nodule is in the superior segment of right lower lobe measuring 1.3 cm, image number 81/series 2. The SUV max associated with this nodule is equal to 0.9, image 81. In the superior segment of the left lower lobe there is a 7 mm pulmonary nodule, image 79. The SUV max associated with this nodule is equal to 0.8. Other scattered nodules are identified and appears similar to recent chest CT. These are all less than 1 cm and difficult to characterize by PET.  There is mild cardiac enlargement. No pericardial effusion. There is no mediastinal or hilar lymph nodes identified. No axillary or supraclavicular adenopathy identified.  ABDOMEN/PELVIS  No abnormal FDG uptake is identified within the liver. The gallbladder appears normal. There is no biliary dilatation. Within the distal  body of the pancreas there is a medium size focus of moderate increased uptake corresponding to the hypodense mass seen on recent CT. The SUV max is equal to  3.2, image 127. The spleen is unremarkable.  Normal appearance of both adrenal glands. There is a large cyst arising from the upper pole of the right kidney. This measures 10 cm and is incompletely characterized without IV contrast. No enlarged or hypermetabolic lymph nodes identified within the upper abdomen. The prostate gland appears enlarged. No free fluid or fluid collections identified within the abdomen or pelvis.  SKELETON  Multiple hemangiomas are identified within the stress set vertebral hemangiomas are noted at L1 and L2 level. There is mild multilevel thoracic and lumbar spondylosis noted.  IMPRESSION: 1. Malignant range FDG uptake is associated with the hypodense mass involving the distal body of pancreas. Findings compatible with primary pancreatic carcinoma. 2. No hypermetabolic metastasis identified. 3. Multi focal pulmonary nodules without associated increased FDG uptake. Most of these nodules are too small to reliably characterize by PET-CT. The largest nodule measures 1.3 cm and has very low level FDG uptake.  Electronically Signed: By: Signa Kell M.D. On: 07/20/2013 14:02    PET scan findings reviewed in detail with patient and family now, including finding of uptake in the main pancreatic mass, the soft tissue findings in omentum and low uptake in 2 of the borderline size pulmonary nodules. Disc for PET requested by MD now from radiology and will be available for patient to pick up after my visit today.  PATHOLOGY Albert Richards, Albert Richards Collected: 07/08/2013 Client: [facility] Accession: [id number] Received: 07/08/2013 Albert Richards DOB: 1941/06/12 Age: 11 Gender: M Reported: 07/09/2013 [address] Patient Ph: [phone] MRN#: [id number] [place], [place] [address] Client Acc#: Chart: Phone: [phone] Fax: LMP:  Visit#: [id number] CC: CYTOPATHOLOGY REPORT Adequacy Reason Satisfactory For Evaluation. Diagnosis FINE NEEDLE ASPIRATION: NEEDLE ASPIRATION, ENDOSCOPIC, PANCREAS TAIL(SPECIMEN 1 OF 1 COLLECTED 07/08/13): MALIGNANT CELLS AND TUMOR NECROSIS, CONSISTENT WITH ADENOCARCINOMA.  Medications: I have reviewed the patient's current medications. We will try amitriptyline at hs for sleep, as this may also give some pain benefit; I have asked him to start with 1/2 of 25 mg tablet in case this aggravates voiding problems, but if tolerated he should increase to 25 mg at hs and could have dose increased from there if needed. Refill on vicodin 5-325 also given for #30 tablets now.  DISCUSSION:  Patient and daughter now seem accepting of findings of metastatic disease; wife is still having a very difficult time with this. I have encouraged wife to talk with her physician about something to help her sleep also.They are all in agreement with proceeding with PAC and chemotherapy as soon as possible. Patient is not interested in study at [facility] if he cannot be assured of the investigational drug, which he cannot. He and family would prefer treatment closer to home if he is not to be on study; Dr Ouida Sills had recommended Dr Mariel Sleet, who will have availability in [place] by early January. I will start referral process to Dr Mariel Sleet at [place] office in [place] Patient does want PAC by Dr Donell Beers if possible, prefers to have chemo education done at Dr Thornton Papas office. I have explained to them that the second opinion consultations also offer quicker and easier access later to those institutions if their help is needed; I have assured him that Dr Mariel Sleet is comfortable with involvement  from [place] and [place].  We have discussed gemzar/ abraxane vs FOLFIRINOX, general outpatient administration, possible side effects in general, need for central catheter and placement of that (not to interfere with shooting his guns), goal of trying  to slow  growth and improve extent of the cancer while still keeping quality of life as good as possible. Patient and family understand that there may be studies for which he would be eligible later, even if he begins chemotherapy of study in near future. Again, this is particularly a concern of his son and they will talk with him about this.  Assessment/Plan:  1. 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 Still good PS. Patient prefers to begin chemotherapy close to home, using best standard regimen now. RLQ pain may be from some of the metastatic involvement, or possibly from low back; no other findings such as appendicitis on scans. Continue prn hydrocodone (or NSAID) and add amitriptyline. 2.long past tobacco. No prior chest CT for comparison.  3.history of nephrolithiasis: no stones seen on recent CT. Large simple right renal cyst.  4.apparent prostatic hypertrophy, symptoms improved with Flomax  5.intentional weight loss of 30 lbs in 4 months after diabetes diagnosis in June 2014. Weight now stable and hemoglobin A1c normal  6.hypothyroid x years, on replacement  7.no colonoscopy  8.vertebral hemangiomata L1 and L2. Scattered degenerative disc disease  9.flu vaccine done   I am glad to see Albert Gunderman back at any time prior to establishing with Dr Mariel Sleet, or afterwards if this office can be of help. Patient and family have had all questions answered to their satisfaction Time spent 45 min in direct counseling with patient and family, as well as coordination of care as documented.   Reece Packer, MD   07/20/2013, 4:42 PM

## 2013-07-20 NOTE — Patient Instructions (Signed)
We have requested power portacath by Dr Donell Beers and new patient appointment with Dr Mariel Sleet.   OK to use Aleve or the Vicodin  Try Amitriptyline one half of 25 mg at bedtime the first night, then can increase to full 25 mg if no problems.  This medicine may help you sleep and may help with some nerve pain.   You can pick up the PET scan disc ~ 5 PM today or in next few days

## 2013-07-20 NOTE — Telephone Encounter (Signed)
Pt is aware that per Windell Moulding @ Dr. Donell Beers office a nurse will call him w/ an appt. gv Tiffany the order to get him schedule w/ Dr. Alvy Bimler pt is aware...td

## 2013-07-21 ENCOUNTER — Telehealth: Payer: Self-pay

## 2013-07-21 NOTE — Telephone Encounter (Signed)
Faxed Information to clinic for new patient referral with Dr. Mariel Sleet as requested by Dr. Darrold Span.

## 2013-07-23 ENCOUNTER — Telehealth (INDEPENDENT_AMBULATORY_CARE_PROVIDER_SITE_OTHER): Payer: Self-pay | Admitting: *Deleted

## 2013-07-23 ENCOUNTER — Telehealth: Payer: Self-pay | Admitting: Oncology

## 2013-07-23 NOTE — Telephone Encounter (Signed)
Medical records faxed to [facility] to Dr. Glenford Peers [phone].

## 2013-07-23 NOTE — Telephone Encounter (Signed)
Albert Richards called regarding pt needing a PAC placed prior to seeing Dr. Mariel Sleet for chemo.  She states that pt is going to be seeing Dr. Mariel Sleet at the first of January.  Please address.  She can be reached back ay [phone].

## 2013-07-25 ENCOUNTER — Telehealth: Payer: Self-pay

## 2013-07-25 NOTE — Telephone Encounter (Signed)
Message copied by Lorine Bears on Sun Jul 25, 2013  9:11 PM ------      Message from: Reece Packer      Created: Sun Jul 25, 2013 12:34 PM       Please fax my note from 12-22 to [facility] attn office staff for new patient visit with Dr Mariel Sleet            thanks ------

## 2013-07-25 NOTE — Telephone Encounter (Signed)
Faxed completed office note for 12-23-14visit as requested below by Dr. Darrold Span.

## 2013-07-26 ENCOUNTER — Telehealth: Payer: Self-pay

## 2013-07-26 ENCOUNTER — Other Ambulatory Visit (INDEPENDENT_AMBULATORY_CARE_PROVIDER_SITE_OTHER): Payer: Self-pay | Admitting: General Surgery

## 2013-07-26 NOTE — Telephone Encounter (Signed)
Albert Richards has a new patient appointment with Dr. Mariel Sleet on Jan. 6, 2015 at 1115.

## 2013-07-27 ENCOUNTER — Encounter (HOSPITAL_COMMUNITY)
Admission: RE | Admit: 2013-07-27 | Discharge: 2013-07-27 | Disposition: A | Discharge status: Home / Self Care | Payer: Medicare Other | Source: Ambulatory Visit | Attending: General Surgery | Admitting: General Surgery

## 2013-07-27 ENCOUNTER — Encounter (HOSPITAL_COMMUNITY): Payer: Self-pay

## 2013-07-27 DIAGNOSIS — C252 Malignant neoplasm of tail of pancreas: Secondary | ICD-10-CM | POA: Diagnosis not present

## 2013-07-27 DIAGNOSIS — E039 Hypothyroidism, unspecified: Secondary | ICD-10-CM | POA: Diagnosis not present

## 2013-07-27 DIAGNOSIS — C801 Malignant (primary) neoplasm, unspecified: Secondary | ICD-10-CM | POA: Diagnosis not present

## 2013-07-27 DIAGNOSIS — C259 Malignant neoplasm of pancreas, unspecified: Secondary | ICD-10-CM | POA: Diagnosis present

## 2013-07-27 HISTORY — DX: Spotted fever due to Rickettsia rickettsii: A77.0

## 2013-07-27 HISTORY — DX: Pneumonia, unspecified organism: J18.9

## 2013-07-27 HISTORY — DX: Unspecified osteoarthritis, unspecified site: M19.90

## 2013-07-27 HISTORY — DX: Malignant (primary) neoplasm, unspecified: C80.1

## 2013-07-27 HISTORY — DX: Chronic obstructive pulmonary disease, unspecified: J44.9

## 2013-07-27 NOTE — Pre-Procedure Instructions (Signed)
Albert Richards  07/27/2013   Your procedure is scheduled on:  Friday, July 30, 2013 at 7:30 AM.   Report to [facility] Entrance "A" Admitting Office at 5:30 AM.   Call this number if you have problems the morning of surgery: [phone]   Remember:   Do not eat food or drink liquids after midnight Thursday, 07/29/13.   Take these medicines the morning of surgery with A SIP OF WATER: levothyroxine (SYNTHROID, LEVOTHROID),  Tamsulosin HCl (FLOMAX), Omeprazole and HYDROcodone-acetaminophen (NORCO/VICODIN) - if needed.    Do not wear jewelry.  Do not wear lotions, powders, or cologne. You may wear deodorant.  Men may shave face and neck.  Do not bring valuables to the hospital.  [facility] is not responsible                  for any belongings or valuables.               Contacts, dentures or bridgework may not be worn into surgery.  Leave suitcase in the car. After surgery it may be brought to your room.  For patients admitted to the hospital, discharge time is determined by your                treatment team.               Patients discharged the day of surgery will not be allowed to drive  home.  Name and phone number of your driver: Family/friend   Special Instructions: Shower using CHG 2 nights before surgery and the night before surgery.  If you shower the day of surgery use CHG.  Use special wash - you have one bottle of CHG for all showers.  You should use approximately 1/3 of the bottle for each shower.   Please read over the following fact sheets that you were given: Pain Booklet, Coughing and Deep Breathing and Surgical Site Infection Prevention

## 2013-07-28 ENCOUNTER — Ambulatory Visit: Payer: Medicare Other | Admitting: Gastroenterology

## 2013-07-29 MED ORDER — CEFAZOLIN SODIUM-DEXTROSE 2-3 GM-% IV SOLR
2.0000 g | INTRAVENOUS | Status: AC
  Administered 2013-07-30: 2 g via INTRAVENOUS
  Filled 2013-07-29: qty 50

## 2013-07-29 MED ORDER — CHLORHEXIDINE GLUCONATE 4 % EX LIQD: 1.0000 "application " | Freq: Once | CUTANEOUS | Status: DC

## 2013-07-30 ENCOUNTER — Ambulatory Visit (HOSPITAL_COMMUNITY): Payer: Medicare Other

## 2013-07-30 ENCOUNTER — Ambulatory Visit (HOSPITAL_COMMUNITY)
Admission: RE | Admit: 2013-07-30 | Discharge: 2013-07-30 | Disposition: A | Discharge status: Home / Self Care | Payer: Medicare Other | Source: Ambulatory Visit | Attending: General Surgery | Admitting: General Surgery

## 2013-07-30 ENCOUNTER — Encounter (HOSPITAL_COMMUNITY)
Admission: RE | Disposition: A | Discharge status: Home / Self Care | Payer: Self-pay | Source: Ambulatory Visit | Attending: General Surgery

## 2013-07-30 ENCOUNTER — Ambulatory Visit (HOSPITAL_COMMUNITY): Payer: Medicare Other | Admitting: Certified Registered Nurse Anesthetist

## 2013-07-30 ENCOUNTER — Encounter (HOSPITAL_COMMUNITY): Payer: Self-pay | Admitting: Surgery

## 2013-07-30 ENCOUNTER — Encounter (HOSPITAL_COMMUNITY): Payer: Medicare Other | Admitting: Certified Registered Nurse Anesthetist

## 2013-07-30 DIAGNOSIS — R918 Other nonspecific abnormal finding of lung field: Secondary | ICD-10-CM | POA: Insufficient documentation

## 2013-07-30 DIAGNOSIS — C252 Malignant neoplasm of tail of pancreas: Secondary | ICD-10-CM | POA: Insufficient documentation

## 2013-07-30 DIAGNOSIS — J449 Chronic obstructive pulmonary disease, unspecified: Secondary | ICD-10-CM | POA: Insufficient documentation

## 2013-07-30 DIAGNOSIS — J4489 Other specified chronic obstructive pulmonary disease: Secondary | ICD-10-CM | POA: Insufficient documentation

## 2013-07-30 DIAGNOSIS — C259 Malignant neoplasm of pancreas, unspecified: Secondary | ICD-10-CM

## 2013-07-30 DIAGNOSIS — K219 Gastro-esophageal reflux disease without esophagitis: Secondary | ICD-10-CM | POA: Insufficient documentation

## 2013-07-30 DIAGNOSIS — E039 Hypothyroidism, unspecified: Secondary | ICD-10-CM | POA: Insufficient documentation

## 2013-07-30 DIAGNOSIS — Z87891 Personal history of nicotine dependence: Secondary | ICD-10-CM | POA: Insufficient documentation

## 2013-07-30 DIAGNOSIS — C801 Malignant (primary) neoplasm, unspecified: Secondary | ICD-10-CM | POA: Insufficient documentation

## 2013-07-30 HISTORY — PX: PORTACATH PLACEMENT: SHX2246

## 2013-07-30 SURGERY — INSERTION, TUNNELED CENTRAL VENOUS DEVICE, WITH PORT
Anesthesia: General | Site: Chest

## 2013-07-30 MED ORDER — SODIUM CHLORIDE 0.9 % IJ SOLN: 3.0000 mL | Freq: Two times a day (BID) | INTRAMUSCULAR | Status: DC

## 2013-07-30 MED ORDER — LIDOCAINE HCL (PF) 1 % IJ SOLN
INTRAMUSCULAR | Status: AC
  Filled 2013-07-30: qty 30

## 2013-07-30 MED ORDER — SODIUM CHLORIDE 0.9 % IR SOLN
Status: DC | PRN
  Administered 2013-07-30: 07:00:00

## 2013-07-30 MED ORDER — OXYCODONE HCL 5 MG PO TABS: 5.0000 mg | ORAL_TABLET | ORAL | Status: DC | PRN

## 2013-07-30 MED ORDER — LIDOCAINE HCL (CARDIAC) 20 MG/ML IV SOLN
INTRAVENOUS | Status: DC | PRN
  Administered 2013-07-30: 80 mg via INTRAVENOUS

## 2013-07-30 MED ORDER — MEPERIDINE HCL 25 MG/ML IJ SOLN: 6.2500 mg | INTRAMUSCULAR | Status: DC | PRN

## 2013-07-30 MED ORDER — PROPOFOL 10 MG/ML IV BOLUS
INTRAVENOUS | Status: DC | PRN
  Administered 2013-07-30: 150 mg via INTRAVENOUS

## 2013-07-30 MED ORDER — ONDANSETRON HCL 4 MG/2ML IJ SOLN
INTRAMUSCULAR | Status: DC | PRN
  Administered 2013-07-30: 4 mg via INTRAVENOUS

## 2013-07-30 MED ORDER — BUPIVACAINE-EPINEPHRINE (PF) 0.25% -1:200000 IJ SOLN
INTRAMUSCULAR | Status: AC
  Filled 2013-07-30: qty 30

## 2013-07-30 MED ORDER — ONDANSETRON HCL 4 MG/2ML IJ SOLN: 4.0000 mg | Freq: Once | INTRAMUSCULAR | Status: DC | PRN

## 2013-07-30 MED ORDER — MIDAZOLAM HCL 5 MG/5ML IJ SOLN
INTRAMUSCULAR | Status: DC | PRN
  Administered 2013-07-30: 2 mg via INTRAVENOUS

## 2013-07-30 MED ORDER — OXYCODONE HCL 5 MG PO TABS: 5.0000 mg | ORAL_TABLET | Freq: Once | ORAL | Status: DC | PRN

## 2013-07-30 MED ORDER — SODIUM CHLORIDE 0.9 % IJ SOLN: 3.0000 mL | INTRAMUSCULAR | Status: DC | PRN

## 2013-07-30 MED ORDER — EPHEDRINE SULFATE 50 MG/ML IJ SOLN
INTRAMUSCULAR | Status: DC | PRN
  Administered 2013-07-30 (×2): 5 mg via INTRAVENOUS

## 2013-07-30 MED ORDER — OXYCODONE-ACETAMINOPHEN 5-325 MG PO TABS: 1.0000 | ORAL_TABLET | ORAL | Status: DC | PRN

## 2013-07-30 MED ORDER — ACETAMINOPHEN 325 MG PO TABS: 650.0000 mg | ORAL_TABLET | ORAL | Status: DC | PRN

## 2013-07-30 MED ORDER — ACETAMINOPHEN 650 MG RE SUPP: 650.0000 mg | RECTAL | Status: DC | PRN

## 2013-07-30 MED ORDER — FENTANYL CITRATE 0.05 MG/ML IJ SOLN
INTRAMUSCULAR | Status: DC | PRN
  Administered 2013-07-30: 100 ug via INTRAVENOUS

## 2013-07-30 MED ORDER — SODIUM CHLORIDE 0.9 % IV SOLN
250.0000 mL | INTRAVENOUS | Status: DC | PRN
Start: 2013-07-30 — End: 2013-07-30

## 2013-07-30 MED ORDER — HEPARIN SOD (PORK) LOCK FLUSH 100 UNIT/ML IV SOLN
INTRAVENOUS | Status: DC | PRN
  Administered 2013-07-30: 500 [IU] via INTRAVENOUS

## 2013-07-30 MED ORDER — ONDANSETRON HCL 4 MG/2ML IJ SOLN: 4.0000 mg | Freq: Four times a day (QID) | INTRAMUSCULAR | Status: DC | PRN

## 2013-07-30 MED ORDER — PHENYLEPHRINE HCL 10 MG/ML IJ SOLN
INTRAMUSCULAR | Status: DC | PRN
  Administered 2013-07-30 (×5): 80 ug via INTRAVENOUS

## 2013-07-30 MED ORDER — BUPIVACAINE-EPINEPHRINE PF 0.25-1:200000 % IJ SOLN
INTRAMUSCULAR | Status: DC | PRN
  Administered 2013-07-30: 07:00:00 via INTRAMUSCULAR

## 2013-07-30 MED ORDER — HEPARIN SOD (PORK) LOCK FLUSH 100 UNIT/ML IV SOLN
INTRAVENOUS | Status: AC
  Filled 2013-07-30: qty 5

## 2013-07-30 MED ORDER — LACTATED RINGERS IV SOLN
INTRAVENOUS | Status: DC | PRN
  Administered 2013-07-30: 07:00:00 via INTRAVENOUS

## 2013-07-30 MED ORDER — HYDROMORPHONE HCL PF 1 MG/ML IJ SOLN: 0.2500 mg | INTRAMUSCULAR | Status: DC | PRN

## 2013-07-30 MED ORDER — OXYCODONE HCL 5 MG/5ML PO SOLN: 5.0000 mg | Freq: Once | ORAL | Status: DC | PRN

## 2013-07-30 MED ORDER — ARTIFICIAL TEARS OP OINT
TOPICAL_OINTMENT | OPHTHALMIC | Status: DC | PRN
  Administered 2013-07-30: 1 via OPHTHALMIC

## 2013-07-30 SURGICAL SUPPLY — 54 items
ADH SKN CLS APL DERMABOND .7 (GAUZE/BANDAGES/DRESSINGS) ×1
BAG DECANTER FOR FLEXI CONT (MISCELLANEOUS) ×2 IMPLANT
BLADE SURG 11 STRL SS (BLADE) ×2 IMPLANT
BLADE SURG 15 STRL LF DISP TIS (BLADE) ×1 IMPLANT
BLADE SURG 15 STRL SS (BLADE) ×2
CANISTER SUCTION 2500CC (MISCELLANEOUS) IMPLANT
CHLORAPREP W/TINT 10.5 ML (MISCELLANEOUS) ×1 IMPLANT
CHLORAPREP W/TINT 26ML (MISCELLANEOUS) ×1 IMPLANT
COVER SURGICAL LIGHT HANDLE (MISCELLANEOUS) ×2 IMPLANT
COVER TRANSDUCER ULTRASND (DRAPES) IMPLANT
CRADLE DONUT ADULT HEAD (MISCELLANEOUS) ×2 IMPLANT
DECANTER SPIKE VIAL GLASS SM (MISCELLANEOUS) ×2 IMPLANT
DERMABOND ADVANCED (GAUZE/BANDAGES/DRESSINGS) ×1
DERMABOND ADVANCED .7 DNX12 (GAUZE/BANDAGES/DRESSINGS) ×1 IMPLANT
DRAPE C-ARM 42X72 X-RAY (DRAPES) ×2 IMPLANT
DRAPE CHEST BREAST 15X10 FENES (DRAPES) ×2 IMPLANT
DRAPE UTILITY 15X26 W/TAPE STR (DRAPE) ×4 IMPLANT
DRAPE WARM FLUID 44X44 (DRAPE) IMPLANT
ELECT COATED BLADE 2.86 ST (ELECTRODE) ×2 IMPLANT
ELECT REM PT RETURN 9FT ADLT (ELECTROSURGICAL) ×2
ELECTRODE REM PT RTRN 9FT ADLT (ELECTROSURGICAL) ×1 IMPLANT
GAUZE SPONGE 4X4 16PLY XRAY LF (GAUZE/BANDAGES/DRESSINGS) ×2 IMPLANT
GLOVE BIO SURGEON STRL SZ 6 (GLOVE) ×2 IMPLANT
GLOVE BIO SURGEON STRL SZ7.5 (GLOVE) ×1 IMPLANT
GLOVE BIOGEL PI IND STRL 6.5 (GLOVE) ×1 IMPLANT
GLOVE BIOGEL PI IND STRL 7.5 (GLOVE) IMPLANT
GLOVE BIOGEL PI INDICATOR 6.5 (GLOVE) ×1
GLOVE BIOGEL PI INDICATOR 7.5 (GLOVE) ×1
GOWN PREVENTION PLUS XXLARGE (GOWN DISPOSABLE) ×2 IMPLANT
GOWN STRL NON-REIN LRG LVL3 (GOWN DISPOSABLE) ×2 IMPLANT
KIT BASIN OR (CUSTOM PROCEDURE TRAY) ×2 IMPLANT
KIT PORT POWER 8FR ISP CVUE (Catheter) ×1 IMPLANT
KIT PORT POWER 9.6FR MRI PREA (Catheter) IMPLANT
KIT PORT POWER ISP 8FR (Catheter) IMPLANT
KIT POWER CATH 8FR (Catheter) IMPLANT
KIT ROOM TURNOVER OR (KITS) ×2 IMPLANT
NDL HYPO 25GX1X1/2 BEV (NEEDLE) ×1 IMPLANT
NEEDLE HYPO 25GX1X1/2 BEV (NEEDLE) ×2 IMPLANT
NS IRRIG 1000ML POUR BTL (IV SOLUTION) ×1 IMPLANT
PACK SURGICAL SETUP 50X90 (CUSTOM PROCEDURE TRAY) ×2 IMPLANT
PAD ARMBOARD 7.5X6 YLW CONV (MISCELLANEOUS) ×2 IMPLANT
PENCIL BUTTON HOLSTER BLD 10FT (ELECTRODE) ×2 IMPLANT
SUT MON AB 4-0 PC3 18 (SUTURE) ×2 IMPLANT
SUT PROLENE 2 0 SH DA (SUTURE) ×4 IMPLANT
SUT VIC AB 3-0 SH 27 (SUTURE) ×2
SUT VIC AB 3-0 SH 27X BRD (SUTURE) ×1 IMPLANT
SYR 20ML ECCENTRIC (SYRINGE) ×4 IMPLANT
SYR 5ML LUER SLIP (SYRINGE) ×2 IMPLANT
SYR CONTROL 10ML LL (SYRINGE) ×2 IMPLANT
TOWEL OR 17X24 6PK STRL BLUE (TOWEL DISPOSABLE) ×1 IMPLANT
TOWEL OR 17X26 10 PK STRL BLUE (TOWEL DISPOSABLE) ×2 IMPLANT
TUBE CONNECTING 12X1/4 (SUCTIONS) IMPLANT
WATER STERILE IRR 1000ML POUR (IV SOLUTION) IMPLANT
YANKAUER SUCT BULB TIP NO VENT (SUCTIONS) IMPLANT

## 2013-07-30 NOTE — Interval H&P Note (Signed)
History and Physical Interval Note:  07/30/2013 7:20 AM  Albert Richards  has presented today for surgery, with the diagnosis of METASTATIC PANCREATIC CANCER  The various methods of treatment have been discussed with the patient and family. After consideration of risks, benefits and other options for treatment, the patient has consented to  Procedure(s): INSERTION PORT-A-CATH (N/A) as a surgical intervention .  The patient's history has been reviewed, patient examined, no change in status, stable for surgery.  I have reviewed the patient's chart and labs.  Questions were answered to the patient's satisfaction.     Kisean Rollo

## 2013-07-30 NOTE — Preoperative (Signed)
Beta Blockers   Reason not to administer Beta Blockers:Not Applicable 

## 2013-07-30 NOTE — Anesthesia Postprocedure Evaluation (Signed)
Anesthesia Post Note  Patient: Albert Richards  Procedure(s) Performed: Procedure(s) (LRB): INSERTION PORT-A-CATH (N/A)  Anesthesia type: general  Patient location: PACU  Post pain: Pain level controlled  Post assessment: Patient's Cardiovascular Status Stable  Last Vitals:  Filed Vitals:   07/30/13 0930  BP:   Pulse:   Temp: 36.4 C  Resp:     Post vital signs: Reviewed and stable  Level of consciousness: sedated  Complications: No apparent anesthesia complications

## 2013-07-30 NOTE — Discharge Instructions (Signed)
[facility] °Office Phone Number [phone] ° ° POST OP INSTRUCTIONS ° °Always review your discharge instruction sheet given to you by the facility where your surgery was performed. ° °IF YOU HAVE DISABILITY OR FAMILY LEAVE FORMS, YOU MUST BRING THEM TO THE OFFICE FOR PROCESSING.  DO NOT GIVE THEM TO YOUR DOCTOR. ° °1. A prescription for pain medication may be given to you upon discharge.  Take your pain medication as prescribed, if needed.  If narcotic pain medicine is not needed, then you may take acetaminophen (Tylenol) or ibuprofen (Advil) as needed. °2. Take your usually prescribed medications unless otherwise directed °3. If you need a refill on your pain medication, please contact your pharmacy.  They will contact our office to request authorization.  Prescriptions will not be filled after 5pm or on week-ends. °4. You should eat very light the first 24 hours after surgery, such as soup, crackers, pudding, etc.  Resume your normal diet the day after surgery °5. It is common to experience some constipation if taking pain medication after surgery.  Increasing fluid intake and taking a stool softener will usually help or prevent this problem from occurring.  A mild laxative (Milk of Magnesia or Miralax) should be taken according to package directions if there are no bowel movements after 48 hours. °6. You may shower in 48 hours.  The surgical glue will flake off in 2-3 weeks.   °7. ACTIVITIES:  No strenuous activity or heavy lifting for 1 week.   °a. You may drive when you no longer are taking prescription pain medication, you can comfortably wear a seatbelt, and you can safely maneuver your car and apply brakes. °b. RETURN TO WORK:  __________1 week_______________ °You should see your doctor in the office for a follow-up appointment approximately three-four weeks after your surgery.   ° °WHEN TO CALL YOUR DOCTOR: °1. Fever over 101.0 °2. Nausea and/or vomiting. °3. Extreme swelling or  bruising. °4. Continued bleeding from incision. °5. Increased pain, redness, or drainage from the incision. ° °The clinic staff is available to answer your questions during regular business hours.  Please don’t hesitate to call and ask to speak to one of the nurses for clinical concerns.  If you have a medical emergency, go to the nearest emergency room or call 911.  A surgeon from [facility] is always on call at the hospital. ° °For further questions, please visit [email or web address]  ° °

## 2013-07-30 NOTE — Anesthesia Preprocedure Evaluation (Addendum)
Anesthesia Evaluation  Patient identified by MRN, date of birth, ID band Patient awake    Reviewed: Allergy & Precautions, H&P , NPO status , Patient's Chart, lab work & pertinent test results  History of Anesthesia Complications Negative for: history of anesthetic complications  Airway Mallampati: I TM Distance: >3 FB Neck ROM: Full    Dental  (+) Teeth Intact and Dental Advisory Given   Pulmonary pneumonia -, resolved, COPDformer smoker,          Cardiovascular + Peripheral Vascular Disease     Neuro/Psych negative neurological ROS     GI/Hepatic Neg liver ROS, GERD-  Controlled and Medicated,Weight loss over 4 months -- 25 pounds Stage 4 pancreatic cancer   Endo/Other  Hypothyroidism   Renal/GU      Musculoskeletal negative musculoskeletal ROS (+)   Abdominal   Peds  Hematology negative hematology ROS (+)   Anesthesia Other Findings   Reproductive/Obstetrics                         Anesthesia Physical Anesthesia Plan  ASA: III  Anesthesia Plan: General   Post-op Pain Management:    Induction: Intravenous  Airway Management Planned: LMA  Additional Equipment:   Intra-op Plan:   Post-operative Plan: Extubation in OR  Informed Consent: I have reviewed the patients History and Physical, chart, labs and discussed the procedure including the risks, benefits and alternatives for the proposed anesthesia with the patient or authorized representative who has indicated his/her understanding and acceptance.   Dental advisory given  Plan Discussed with: CRNA and Surgeon  Anesthesia Plan Comments:       Anesthesia Quick Evaluation

## 2013-07-30 NOTE — Transfer of Care (Signed)
Immediate Anesthesia Transfer of Care Note  Patient: Albert Richards  Procedure(s) Performed: Procedure(s): INSERTION PORT-A-CATH (N/A)  Patient Location: PACU  Anesthesia Type:General  Level of Consciousness: awake, alert , oriented and patient cooperative  Airway & Oxygen Therapy: Patient Spontanous Breathing  Post-op Assessment: Report given to PACU RN, Post -op Vital signs reviewed and stable and Patient moving all extremities X 4  Post vital signs: Reviewed and stable  Complications: No apparent anesthesia complications

## 2013-07-30 NOTE — H&P (View-Only) (Signed)
Chief Complaint  Patient presents with  . New Evaluation    eval pancreatic tail mass    HISTORY: Patient is a 73 year old male who presents with new diagnosis of pancreatic cancer in the tail.  He developed diabetes in June. He deliberately cut out significant quantities of sugar in his diet and lost 30 pounds. The diabetes resolved. However, in October he started developing upper abdominal pain. This was first present to be reflux and was treated with Prilosec twice a day. He then received a right upper quadrant ultrasound which was negative for gallstones. He was informed that because of the weight loss, he may be experiencing abdominal pain secondary to this.  He decided to switch primary care physicians and upon seeing the new one, a CT scan was performed which demonstrated the pancreatic tail mass. He subsequently has undergone endoscopic ultrasound which demonstrated a 3.2 cm mass "intimately involving the splenic vessels."  Abdominal CT was concerning for potential metastases at the base of the lungs.  He subsequently underwent chest CT which demonstrated innumerable pulmonary nodules highly suspicious for metastases. He continues to have the upper abdominal pain. He denies continued weight loss. He denies diarrhea. He has not been taking narcotics for the discomfort. He has been taking one to 2 Aleve per day.  Past Medical History  Diagnosis Date  . Hypothyroidism   . History of kidney stones 10-12 yrs ago    Past Surgical History  Procedure Laterality Date  . Tonsillectomy  as child  . Eus N/A 07/08/2013    Procedure: UPPER ENDOSCOPIC ULTRASOUND (EUS) LINEAR;  Surgeon: Milus Banister, MD;  Location: [facility];  Service: Endoscopy;  Laterality: N/A;    Current Outpatient Prescriptions  Medication Sig Dispense Refill  . docusate sodium (COLACE) 100 MG capsule Take 100 mg by mouth daily.      Marland Kitchen HYDROcodone-acetaminophen (NORCO/VICODIN) 5-325 MG per tablet Take 1-2 tablets every  4-6 hours as needed for pain  30 tablet  0  . levothyroxine (SYNTHROID, LEVOTHROID) 150 MCG tablet Take 150 mcg by mouth daily before breakfast.      . naproxen sodium (ANAPROX) 220 MG tablet Take 220 mg by mouth 2 (two) times daily with a meal.      . Tamsulosin HCl (FLOMAX) 0.4 MG CAPS Take 0.4 mg by mouth every morning.       No current facility-administered medications for this visit.     No Known Allergies   History reviewed. No pertinent family history.   History   Social History  . Marital Status: Married    Spouse Name: N/A    Number of Children: N/A  . Years of Education: N/A   Social History Main Topics  . Smoking status: Former Smoker -- 1.00 packs/day for 50 years    Types: Cigarettes    Start date: 07/30/1955    Quit date: 07/29/2005  . Smokeless tobacco: Never Used  . Alcohol Use: No  . Drug Use: No  . Sexual Activity: None   REVIEW OF SYSTEMS - PERTINENT POSITIVES ONLY: 12 point review of systems negative other than HPI and PMH except for abdominal pain.  EXAMDanley Danker Vitals:   07/12/13 1326  BP: 170/96  Pulse: 72  Temp: 98.6 F (37 C)  Resp: 14   Filed Weights   07/12/13 1326  Weight: 199 lb 9.6 oz (90.538 kg)     Gen:  No acute distress.  Well nourished and well groomed.   Neurological: Alert and oriented to  person, place, and time. Coordination normal.  Head: Normocephalic and atraumatic. Abrasion to tip of nose.  Pt thinks related to previous skin cancer site that he scratched.   Eyes: Conjunctivae are normal. Pupils are equal, round, and reactive to light. No scleral icterus.  Neck: Normal range of motion. Neck supple. No tracheal deviation or thyromegaly present.  Cardiovascular: Normal rate, regular rhythm, normal heart sounds and intact distal pulses.  Exam reveals no gallop and no friction rub.  No murmur heard. Respiratory: Effort normal.  No respiratory distress. No chest wall tenderness. Breath sounds normal.  No wheezes, rales or  rhonchi.  GI: Soft. Bowel sounds are normal. The abdomen is soft and nontender.  There is no rebound and no guarding.  Musculoskeletal: Normal range of motion. Extremities are nontender.  Lymphadenopathy: No cervical, preauricular, postauricular or axillary adenopathy is present Skin: Skin is warm and dry. No rash noted. No diaphoresis. No erythema. No pallor. No clubbing, cyanosis, or edema.   Psychiatric: Normal mood and affect. Behavior is normal. Judgment and thought content normal.    LABORATORY RESULTS: Available labs are reviewed  Ca 19-9 391 CMET normal CBC normal  pathology Diagnosis FINE NEEDLE ASPIRATION: NEEDLE ASPIRATION, ENDOSCOPIC, PANCREAS TAIL(SPECIMEN 1 OF 1 COLLECTED 07/08/13): MALIGNANT CELLS AND TUMOR NECROSIS, CONSISTENT WITH ADENOCARCINOMA.  RADIOLOGY RESULTS: See E-Chart or I-Site for most recent results.  Images and reports are reviewed. CT chest 12/4 IMPRESSION:  1. Innumerable pulmonary nodules scattered throughout the lungs  bilaterally, highly concerning for metastatic disease, as detailed  above.  2. Hypovascular mass in the distal body of the pancreas appears  slightly larger than the prior study, currently measuring 3.2 x 2.4  cm, as above.  3. Atherosclerosis, including left anterior descending and right  coronary artery disease.  4. Additional incidental findings, as above.  CT abd/pelvis 11/26 IMPRESSION:  Poorly defined pancreatic tail mass 3.1 x 1.9 x 1.6 cm highly  worrisome for a pancreatic neoplasm.  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  Suspected pulmonary metastases at lung bases.     ASSESSMENT AND PLAN: Stage IV adenocarcinoma of pancreas The patient is not a candidate for resection since he has what appears to be metastatic disease. There are innumerable pulmonary nodules and nodules at the base of the lungs. He also appears to have peritoneal implants on CT  scan.  The patient seems to be in a bit of denial about the diagnosis. He and his family do not want to accept that he has stage IV disease without a biopsy of a distant metastasis. Advised him that I would recommend chemotherapy. I encouraged him to seek additional diagnoses from a tertiary care center as well as a nationally recognized center.  He and his family would like to pursue referral at [facility] and at [facility].  I do not know if he would be a candidate for aggressive trials given his age.  However he does have a very good functional status. 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    I discussed with the patient briefly the role of Port-A-Cath placement. I would not schedule the surgery until I am sure he is going to require it. I discussed that he may need celiac block or palliative radiation if his abdominal pain were to worsen. However, I'm concerned that some of the abdominal pain may be from peritoneal implants are not benefit from celiac block. He is not currently taking narcotics for the  pain.  He has family are to pursue potential enrollment in trials if he is a candidate. I encouraged this.  I spent 45 min in evaluation, examination, counseling and coordination of care.  >50% of this time was in counseling.       Milus Height MD Surgical Oncology, General and [facility]      Visit Diagnoses: 1. Stage IV adenocarcinoma of pancreas     Primary Care Physician: [name], MD  Onc : Marko Plume GI:  Ardis Hughs.

## 2013-07-30 NOTE — Op Note (Signed)
PREOPERATIVE DIAGNOSIS:  Metastatic pancreatic cancer     POSTOPERATIVE DIAGNOSIS:  Same     PROCEDURE: left subclavian port placement, Bard ClearVue  Power Port, MRI safe, 8-French.      SURGEON:  Stark Klein, MD      ANESTHESIA:  General   FINDINGS:  Good venous return, easy flush, and tip of the catheter and   SVC 26 cm.      SPECIMEN:  None.      ESTIMATED BLOOD LOSS:  Minimal.      COMPLICATIONS:  None known.      PROCEDURE:  Pt was identified in the holding area and taken to   the operating room, where patient was placed supine on the operating room   table.  General anesthesia was induced.  Patient's arms were tucked and the upper  chest and neck were prepped and draped in sterile fashion.  Time-out was   performed according to the surgical safety check list.  When all was   correct, we continued.   Local anesthetic was administered over this   area at the angle of the clavicle.  The vein was accessed with 2 passes of the needle. There was good venous return and the wire passed easily with no ectopy.   Fluoroscopy was used to confirm that the wire was in the vena cava.      The patient was placed back level and the area for the pocket was anethetized   with local anesthetic.  A 3-cm transverse incision was made with a #15   blade.  Cautery was used to divide the subcutaneous tissues down to the   pectoralis muscle.  An [employer] retractor was used to elevate the skin   while a pocket was created on top of the pectoralis fascia.  The port   was placed into the pocket to confirm that it was of adequate size.  The   catheter was preattached to the port.  The port was then secured to the   pectoralis fascia with four 2-0 Prolene sutures.  These were clamped and   not tied down yet.    The catheter was tunneled through to the wire exit   site.  The catheter was placed along the wire to determine what length it should be to be in the SVC.  The catheter was cut at 26 cm.  The  tunneler sheath and dilator were passed over the wire and the dilator and wire were removed.  The catheter was advanced through the tunneler sheath and the tunneler sheath was pulled away.  Care was taken to keep the catheter in the tunneler sheath as this occurred. This was advanced and the tunneler sheath was removed.  There was good venous   return and easy flush of the catheter.  The Prolene sutures were tied   down to the pectoral fascia.  The skin was reapproximated using 3-0   Vicryl interrupted deep dermal sutures.    Fluoroscopy was used to re-confirm good position of the catheter.  The skin   was then closed using 4-0 Monocryl in a subcuticular fashion.  The port was flushed with concentrated heparin flush as well.  The wounds were then cleaned, dried, and dressed with Dermabond.  The patient was awakened from anesthesia and taken to the PACU in stable condition.  Needle, sponge, and instrument counts were correct.               Stark Klein, MD

## 2013-08-04 ENCOUNTER — Encounter (HOSPITAL_COMMUNITY): Payer: Self-pay | Admitting: General Surgery

## 2015-01-23 ENCOUNTER — Other Ambulatory Visit: Payer: Self-pay

## 2015-05-02 ENCOUNTER — Other Ambulatory Visit (HOSPITAL_COMMUNITY): Payer: Self-pay | Admitting: Oncology

## 2015-05-02 DIAGNOSIS — C259 Malignant neoplasm of pancreas, unspecified: Secondary | ICD-10-CM

## 2015-05-09 ENCOUNTER — Encounter (HOSPITAL_COMMUNITY)
Admission: RE | Admit: 2015-05-09 | Discharge: 2015-05-09 | Disposition: A | Discharge status: Home / Self Care | Payer: Medicare Other | Source: Ambulatory Visit | Attending: Oncology | Admitting: Oncology

## 2015-05-09 DIAGNOSIS — C259 Malignant neoplasm of pancreas, unspecified: Secondary | ICD-10-CM | POA: Diagnosis present

## 2015-05-09 LAB — GLUCOSE, CAPILLARY: Glucose-Capillary: 161 mg/dL — ABNORMAL HIGH (ref 65–99)

## 2015-05-09 MED ORDER — FLUDEOXYGLUCOSE F - 18 (FDG) INJECTION
10.0000 | Freq: Once | INTRAVENOUS | Status: DC | PRN
  Administered 2015-05-09: 10 via INTRAVENOUS
  Filled 2015-05-09: qty 10

## 2015-05-11 ENCOUNTER — Ambulatory Visit (HOSPITAL_COMMUNITY): Payer: Self-pay

## 2015-09-27 IMAGING — CT CT ABD-PELV W/ CM
2 of 4 series · 16 of 46 positions shown, 18 images · IV contrast (Omnipaque 300)
Comparison: None

CLINICAL DATA: Thyroid disease, 30 lb weight loss since [REDACTED],
abdominal pain

EXAM:
CT ABDOMEN AND PELVIS WITH CONTRAST
TECHNIQUE: Multidetector CT imaging of the abdomen and pelvis was performed
using the standard protocol following bolus administration of
intravenous contrast. Sagittal and coronal MPR images reconstructed
from axial data set.
CONTRAST:  100mL OMNIPAQUE IOHEXOL 300 MG/ML SOLN. Dilute oral
contrast.

[Series 2: abd_pel_with 5.0 b40f · axial · 0.77mm/px · z∈[+253,+698]mm · 13 of 99 slices shown, 15 images]
[im 5/99  soft-tissue]
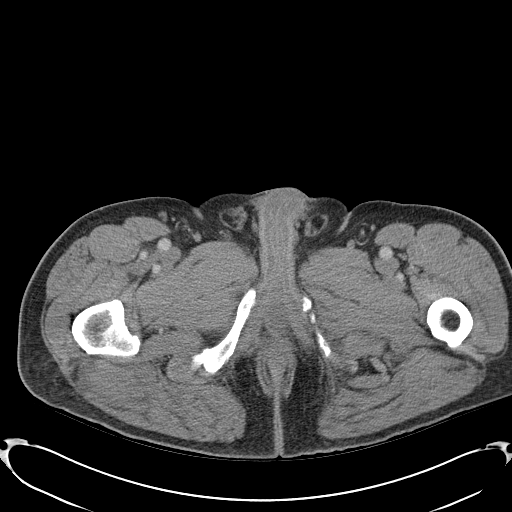
[im 5/99  bone]
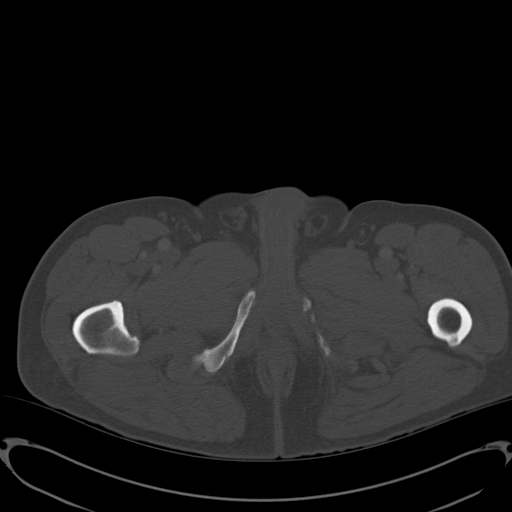
[im 15/99  soft-tissue]
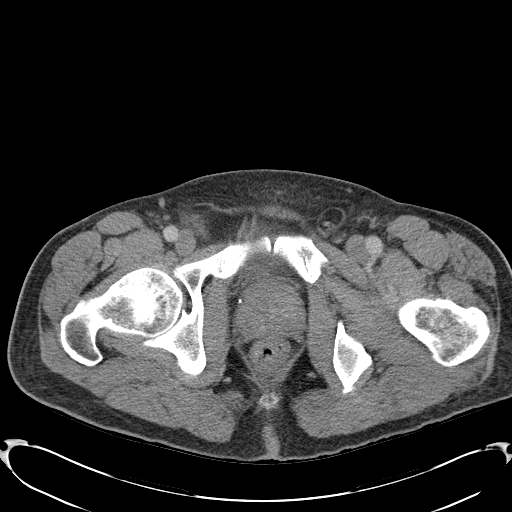
[im 19/99  soft-tissue]
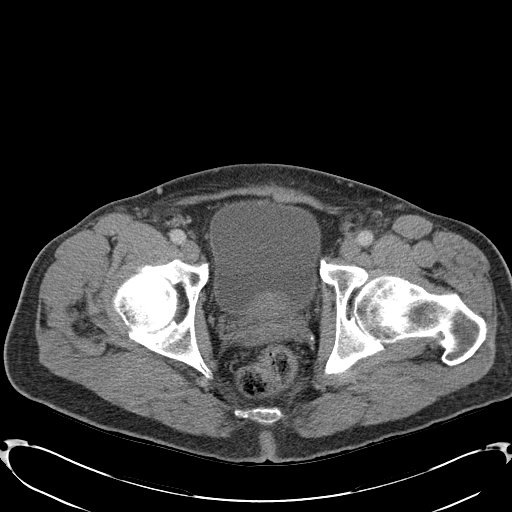
[im 29/99  soft-tissue]
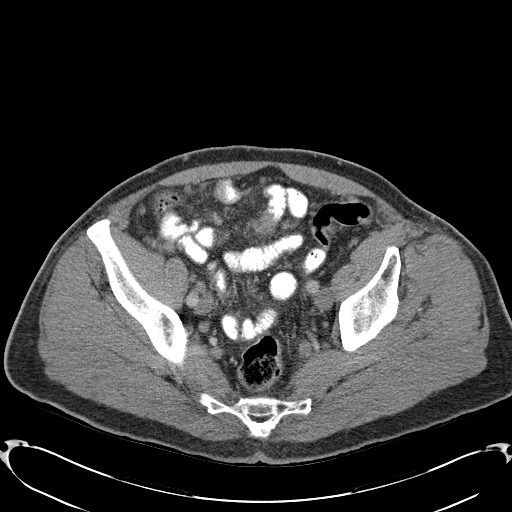
[im 33/99  soft-tissue]
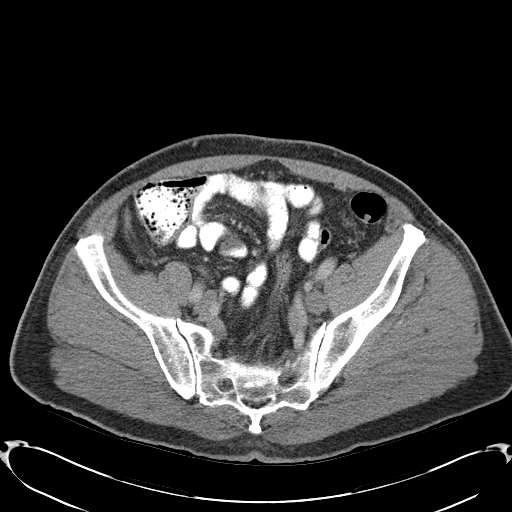
[im 43/99  soft-tissue]
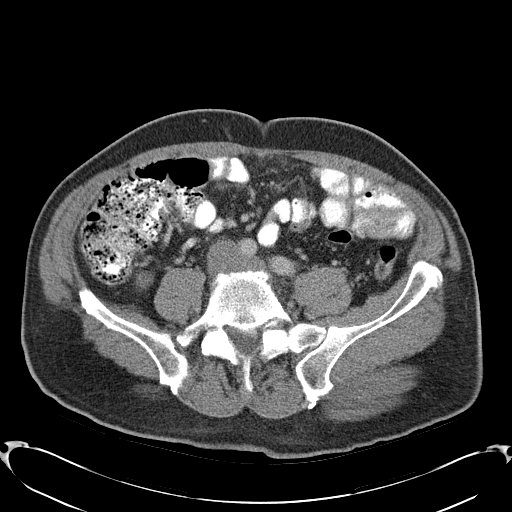
[im 52/99  soft-tissue]
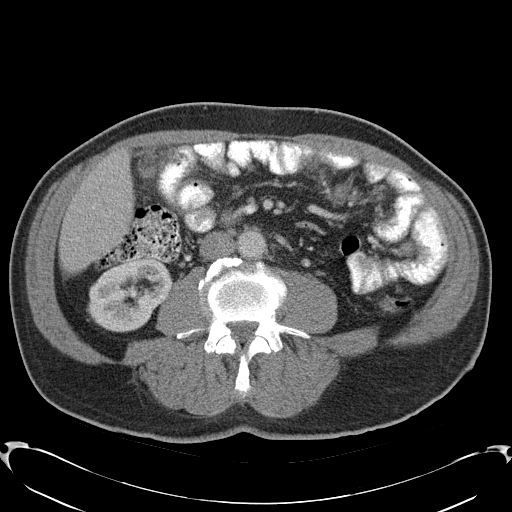
[im 57/99  soft-tissue]
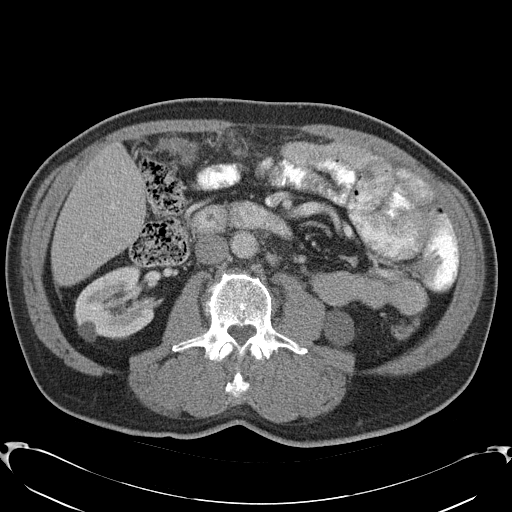
[im 66/99  soft-tissue]
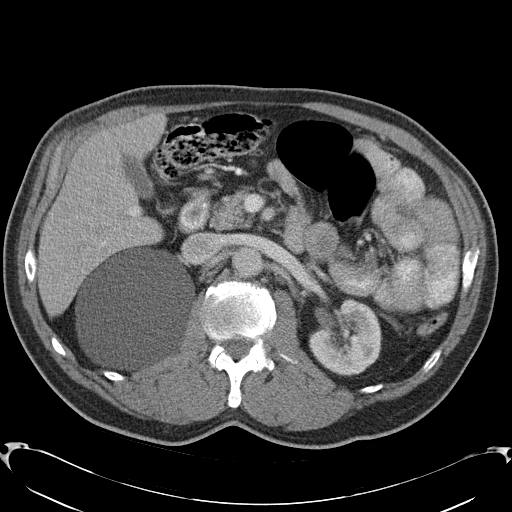
[im 66/99  bone]
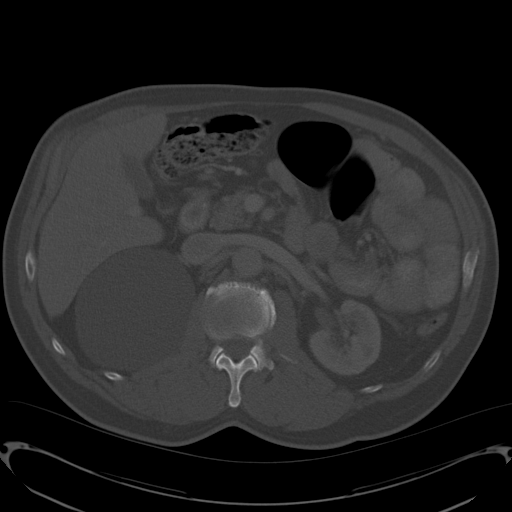
[im 71/99  soft-tissue]
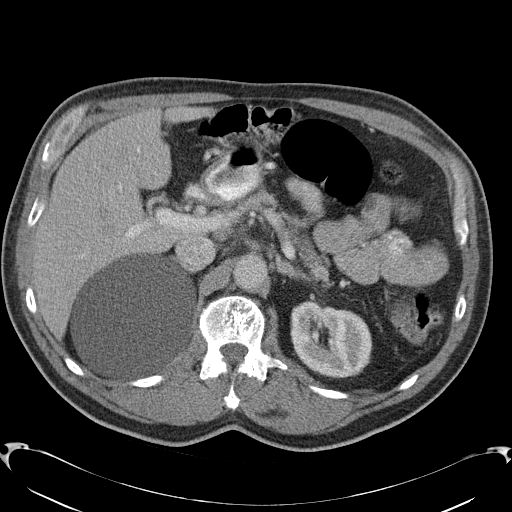
[im 80/99  soft-tissue]
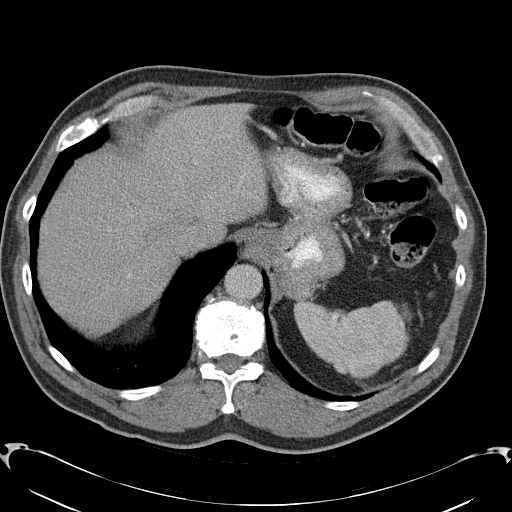
[im 85/99  soft-tissue]
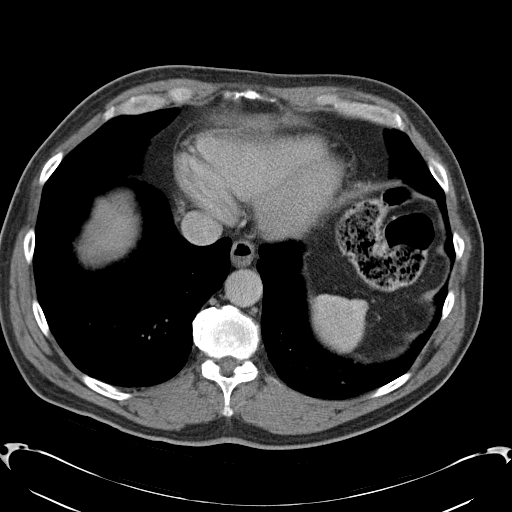
[im 94/99  soft-tissue]
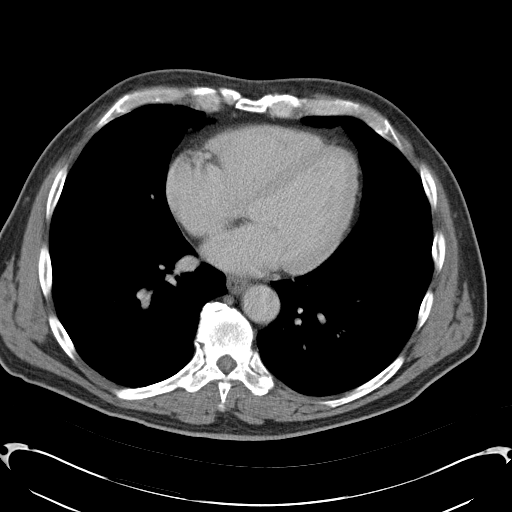

[Series 4: abd_pel_with 3.0 spo cor · coronal · 0.79mm/px · 3 of 89 slices shown]
[im 30/89  soft-tissue]
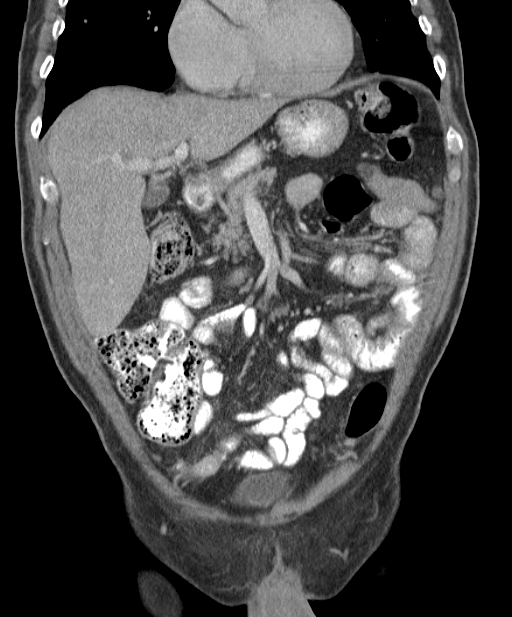
[im 40/89  soft-tissue]
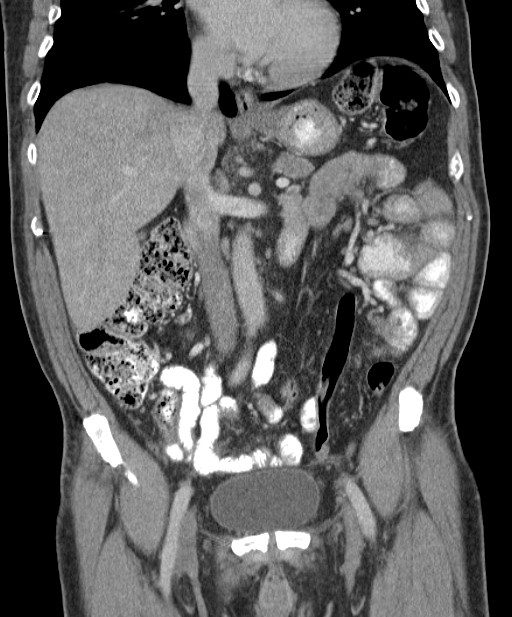
[im 49/89  soft-tissue]
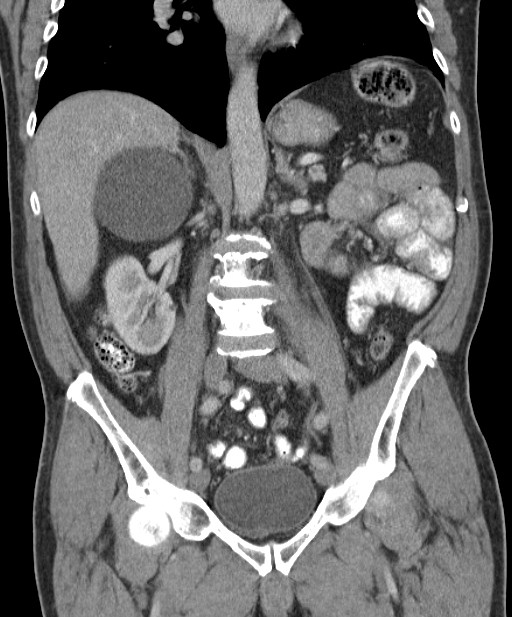

[16 of 46 positions shown; findings below may reference images not displayed]

FINDINGS: Minimal dependent atelectasis at lung bases.

Multiple small bilateral lung base lung nodules up to 6 mm diameter
worrisome for metastases.

Large simple appearing cyst at upper pole right kidney 10.7 x 8.9 x
9.6 cm.

Additional smaller bilateral renal cysts.

Liver, spleen, and adrenal glands normal appearance.

Abnormal area of inhomogeneous enhancement at the pancreatic tail
highly worrisome for a pancreatic neoplasm, area in question
approximately 3.1 x 1.9 x 1.6 cm.

Remainder of pancreas normal appearance.

No definite peripancreatic adenopathy.

Infiltrative changes are seen at the greater omentum worrisome for
peritoneal carcinomatosis, note images 31, 40, 44 and 48.

No significant ascites.

No urinary tract calcification or dilatation.

Few small normal-sized periaortic lymph nodes.

No definite adenopathy.

Stomach incompletely distended unable to exclude gastric wall
thickening.

Large and small bowel loops normal appearance.

Appendix not visualized.

Vertebral hemangiomata at L1 and L2.

Multilevel scattered degenerative disc disease changes.
IMPRESSION: Poorly defined pancreatic tail mass 3.1 x 1.9 x 1.6 cm highly
worrisome for a pancreatic neoplasm.

Scattered infiltrative changes in the anterior abdomen consistent
with peritoneal carcinomatosis.

Suspected pulmonary metastases at lung bases.

## 2016-01-08 ENCOUNTER — Emergency Department (HOSPITAL_COMMUNITY): Payer: Medicare Other | Admitting: Anesthesiology

## 2016-01-08 ENCOUNTER — Encounter (HOSPITAL_COMMUNITY): Payer: Self-pay | Admitting: *Deleted

## 2016-01-08 ENCOUNTER — Ambulatory Visit (HOSPITAL_COMMUNITY)
Admission: EM | Admit: 2016-01-08 | Discharge: 2016-01-08 | Disposition: A | Discharge status: Home / Self Care | Payer: Medicare Other | Attending: Emergency Medicine | Admitting: Emergency Medicine

## 2016-01-08 ENCOUNTER — Encounter (HOSPITAL_COMMUNITY)
Admission: EM | Disposition: A | Discharge status: Home / Self Care | Payer: Self-pay | Source: Home / Self Care | Attending: Emergency Medicine

## 2016-01-08 ENCOUNTER — Emergency Department (HOSPITAL_COMMUNITY): Payer: Medicare Other

## 2016-01-08 DIAGNOSIS — S61012A Laceration without foreign body of left thumb without damage to nail, initial encounter: Secondary | ICD-10-CM | POA: Insufficient documentation

## 2016-01-08 DIAGNOSIS — S6432XA Injury of digital nerve of left thumb, initial encounter: Secondary | ICD-10-CM | POA: Diagnosis not present

## 2016-01-08 DIAGNOSIS — Z87891 Personal history of nicotine dependence: Secondary | ICD-10-CM | POA: Insufficient documentation

## 2016-01-08 DIAGNOSIS — Z79899 Other long term (current) drug therapy: Secondary | ICD-10-CM | POA: Diagnosis not present

## 2016-01-08 DIAGNOSIS — W278XXA Contact with other nonpowered hand tool, initial encounter: Secondary | ICD-10-CM | POA: Diagnosis not present

## 2016-01-08 DIAGNOSIS — K219 Gastro-esophageal reflux disease without esophagitis: Secondary | ICD-10-CM | POA: Diagnosis not present

## 2016-01-08 DIAGNOSIS — Y9389 Activity, other specified: Secondary | ICD-10-CM | POA: Diagnosis not present

## 2016-01-08 DIAGNOSIS — S68611A Complete traumatic transphalangeal amputation of left index finger, initial encounter: Secondary | ICD-10-CM | POA: Diagnosis not present

## 2016-01-08 DIAGNOSIS — C259 Malignant neoplasm of pancreas, unspecified: Secondary | ICD-10-CM | POA: Insufficient documentation

## 2016-01-08 DIAGNOSIS — E039 Hypothyroidism, unspecified: Secondary | ICD-10-CM | POA: Insufficient documentation

## 2016-01-08 DIAGNOSIS — J449 Chronic obstructive pulmonary disease, unspecified: Secondary | ICD-10-CM | POA: Diagnosis not present

## 2016-01-08 DIAGNOSIS — Z23 Encounter for immunization: Secondary | ICD-10-CM | POA: Insufficient documentation

## 2016-01-08 DIAGNOSIS — S68129A Partial traumatic metacarpophalangeal amputation of unspecified finger, initial encounter: Secondary | ICD-10-CM | POA: Diagnosis present

## 2016-01-08 DIAGNOSIS — C799 Secondary malignant neoplasm of unspecified site: Secondary | ICD-10-CM | POA: Insufficient documentation

## 2016-01-08 HISTORY — PX: I & D EXTREMITY: SHX5045

## 2016-01-08 HISTORY — PX: AMPUTATION: SHX166

## 2016-01-08 LAB — COMPREHENSIVE METABOLIC PANEL
ALBUMIN: 3.8 g/dL (ref 3.5–5.0)
ALK PHOS: 121 U/L (ref 38–126)
ALT: 15 U/L — ABNORMAL LOW (ref 17–63)
ANION GAP: 8 (ref 5–15)
AST: 19 U/L (ref 15–41)
BUN: 18 mg/dL (ref 6–20)
CALCIUM: 9 mg/dL (ref 8.9–10.3)
CHLORIDE: 108 mmol/L (ref 101–111)
CO2: 24 mmol/L (ref 22–32)
Creatinine, Ser: 1.07 mg/dL (ref 0.61–1.24)
GFR calc Af Amer: >60 mL/min (ref >60)
GFR calc non Af Amer: >60 mL/min (ref >60)
Glucose, Bld: 135 mg/dL — ABNORMAL HIGH (ref 65–99)
Potassium: 3.9 mmol/L (ref 3.5–5.1)
SODIUM: 140 mmol/L (ref 135–145)
Total Bilirubin: 0.5 mg/dL (ref 0.3–1.2)
Total Protein: 6.8 g/dL (ref 6.5–8.1)

## 2016-01-08 LAB — CBC WITH DIFFERENTIAL/PLATELET
BASOS PCT: 0 %
Basophils Absolute: 0 10*3/uL (ref 0.0–0.1)
EOS ABS: 0.1 10*3/uL (ref 0.0–0.7)
EOS PCT: 1 %
HCT: 44.3 % (ref 39.0–52.0)
HEMOGLOBIN: 14.7 g/dL (ref 13.0–17.0)
LYMPHS ABS: 3.7 10*3/uL (ref 0.7–4.0)
Lymphocytes Relative: 36 %
MCH: 31.7 pg (ref 26.0–34.0)
MCHC: 33.2 g/dL (ref 30.0–36.0)
MCV: 95.5 fL (ref 78.0–100.0)
Monocytes Absolute: 0.9 10*3/uL (ref 0.1–1.0)
Monocytes Relative: 9 %
NEUTROS PCT: 54 %
Neutro Abs: 5.5 10*3/uL (ref 1.7–7.7)
PLATELETS: 187 10*3/uL (ref 150–400)
RBC: 4.64 MIL/uL (ref 4.22–5.81)
RDW: 13.6 % (ref 11.5–15.5)
WBC: 10.1 10*3/uL (ref 4.0–10.5)

## 2016-01-08 LAB — PROTIME-INR
INR: 1.03 (ref 0.00–1.49)
PROTHROMBIN TIME: 13.7 s (ref 11.6–15.2)

## 2016-01-08 SURGERY — IRRIGATION AND DEBRIDEMENT EXTREMITY
Anesthesia: General | Site: Thumb | Laterality: Left

## 2016-01-08 MED ORDER — ONDANSETRON HCL 4 MG/2ML IJ SOLN
INTRAMUSCULAR | Status: AC
  Filled 2016-01-08: qty 2

## 2016-01-08 MED ORDER — PHENYLEPHRINE HCL 10 MG/ML IJ SOLN
INTRAMUSCULAR | Status: DC | PRN
  Administered 2016-01-08: 80 ug via INTRAVENOUS

## 2016-01-08 MED ORDER — TETANUS-DIPHTH-ACELL PERTUSSIS 5-2.5-18.5 LF-MCG/0.5 IM SUSP
0.5000 mL | Freq: Once | INTRAMUSCULAR | Status: AC
  Administered 2016-01-08: 0.5 mL via INTRAMUSCULAR
  Filled 2016-01-08: qty 0.5

## 2016-01-08 MED ORDER — FENTANYL CITRATE (PF) 250 MCG/5ML IJ SOLN
INTRAMUSCULAR | Status: DC | PRN
  Administered 2016-01-08: 100 ug via INTRAVENOUS
  Administered 2016-01-08: 50 ug via INTRAVENOUS

## 2016-01-08 MED ORDER — SUCCINYLCHOLINE CHLORIDE 20 MG/ML IJ SOLN
INTRAMUSCULAR | Status: DC | PRN
  Administered 2016-01-08: 80 mg via INTRAVENOUS

## 2016-01-08 MED ORDER — MIDAZOLAM HCL 2 MG/2ML IJ SOLN
INTRAMUSCULAR | Status: DC | PRN
  Administered 2016-01-08: 1 mg via INTRAVENOUS

## 2016-01-08 MED ORDER — MIDAZOLAM HCL 2 MG/2ML IJ SOLN
INTRAMUSCULAR | Status: AC
  Filled 2016-01-08: qty 2

## 2016-01-08 MED ORDER — ONDANSETRON HCL 4 MG/2ML IJ SOLN: 4.0000 mg | Freq: Once | INTRAMUSCULAR | Status: DC | PRN

## 2016-01-08 MED ORDER — EPHEDRINE 5 MG/ML INJ
INTRAVENOUS | Status: AC
  Filled 2016-01-08: qty 10

## 2016-01-08 MED ORDER — FENTANYL CITRATE (PF) 100 MCG/2ML IJ SOLN: 50.0000 ug | Freq: Once | INTRAMUSCULAR | Status: DC

## 2016-01-08 MED ORDER — FENTANYL CITRATE (PF) 100 MCG/2ML IJ SOLN
50.0000 ug | INTRAMUSCULAR | Status: DC | PRN
  Administered 2016-01-08 (×2): 50 ug via INTRAVENOUS
  Filled 2016-01-08 (×2): qty 2

## 2016-01-08 MED ORDER — PROPOFOL 10 MG/ML IV BOLUS
INTRAVENOUS | Status: DC | PRN
  Administered 2016-01-08: 150 ug via INTRAVENOUS

## 2016-01-08 MED ORDER — BUPIVACAINE HCL (PF) 0.25 % IJ SOLN
INTRAMUSCULAR | Status: AC
  Filled 2016-01-08: qty 30

## 2016-01-08 MED ORDER — CEFAZOLIN SODIUM-DEXTROSE 2-4 GM/100ML-% IV SOLN
2.0000 g | Freq: Once | INTRAVENOUS | Status: AC
  Administered 2016-01-08: 2 g via INTRAVENOUS
  Filled 2016-01-08: qty 100

## 2016-01-08 MED ORDER — EPHEDRINE SULFATE 50 MG/ML IJ SOLN
INTRAMUSCULAR | Status: DC | PRN
  Administered 2016-01-08 (×3): 10 mg via INTRAVENOUS

## 2016-01-08 MED ORDER — OXYCODONE-ACETAMINOPHEN 5-325 MG PO TABS: 1.0000 | ORAL_TABLET | ORAL | Status: DC | PRN

## 2016-01-08 MED ORDER — SODIUM CHLORIDE 0.9 % IV SOLN
INTRAVENOUS | Status: DC | PRN
  Administered 2016-01-08: 20:00:00 via INTRAVENOUS

## 2016-01-08 MED ORDER — PHENYLEPHRINE 40 MCG/ML (10ML) SYRINGE FOR IV PUSH (FOR BLOOD PRESSURE SUPPORT)
PREFILLED_SYRINGE | INTRAVENOUS | Status: AC
  Filled 2016-01-08: qty 10

## 2016-01-08 MED ORDER — SODIUM CHLORIDE 0.9 % IR SOLN
Status: DC | PRN
  Administered 2016-01-08 (×2): 1000 mL

## 2016-01-08 MED ORDER — ONDANSETRON HCL 4 MG/2ML IJ SOLN
INTRAMUSCULAR | Status: DC | PRN
  Administered 2016-01-08: 4 mg via INTRAVENOUS

## 2016-01-08 MED ORDER — FENTANYL CITRATE (PF) 250 MCG/5ML IJ SOLN
INTRAMUSCULAR | Status: AC
  Filled 2016-01-08: qty 5

## 2016-01-08 MED ORDER — HYDROMORPHONE HCL 1 MG/ML IJ SOLN: 0.2500 mg | INTRAMUSCULAR | Status: DC | PRN

## 2016-01-08 MED ORDER — BUPIVACAINE HCL (PF) 0.25 % IJ SOLN
INTRAMUSCULAR | Status: DC | PRN
  Administered 2016-01-08: 10 mL

## 2016-01-08 SURGICAL SUPPLY — 40 items
BANDAGE ELASTIC 3 VELCRO ST LF (GAUZE/BANDAGES/DRESSINGS) ×2 IMPLANT
BANDAGE ELASTIC 4 VELCRO ST LF (GAUZE/BANDAGES/DRESSINGS) ×2 IMPLANT
BNDG CMPR 9X4 STRL LF SNTH (GAUZE/BANDAGES/DRESSINGS) ×2
BNDG ESMARK 4X9 LF (GAUZE/BANDAGES/DRESSINGS) ×4 IMPLANT
BNDG GAUZE ELAST 4 BULKY (GAUZE/BANDAGES/DRESSINGS) ×2 IMPLANT
CORDS BIPOLAR (ELECTRODE) ×4 IMPLANT
COVER SURGICAL LIGHT HANDLE (MISCELLANEOUS) ×6 IMPLANT
CUFF TOURNIQUET SINGLE 18IN (TOURNIQUET CUFF) ×4 IMPLANT
DECANTER SPIKE VIAL GLASS SM (MISCELLANEOUS) ×4 IMPLANT
GAUZE SPONGE 4X4 12PLY STRL (GAUZE/BANDAGES/DRESSINGS) ×2 IMPLANT
GAUZE XEROFORM 1X8 LF (GAUZE/BANDAGES/DRESSINGS) ×2 IMPLANT
GLOVE SURG SYN 8.0 (GLOVE) ×4 IMPLANT
GLOVE SURG SYN 8.0 PF PI (GLOVE) ×2 IMPLANT
GOWN STRL REUS W/ TWL LRG LVL3 (GOWN DISPOSABLE) ×2 IMPLANT
GOWN STRL REUS W/ TWL XL LVL3 (GOWN DISPOSABLE) ×2 IMPLANT
GOWN STRL REUS W/TWL LRG LVL3 (GOWN DISPOSABLE) ×4
GOWN STRL REUS W/TWL XL LVL3 (GOWN DISPOSABLE) ×4
KIT BASIN OR (CUSTOM PROCEDURE TRAY) ×4 IMPLANT
KIT ROOM TURNOVER OR (KITS) ×4 IMPLANT
MANIFOLD NEPTUNE II (INSTRUMENTS) ×4 IMPLANT
NDL HYPO 25GX1X1/2 BEV (NEEDLE) IMPLANT
NDL HYPO 25X1 1.5 SAFETY (NEEDLE) ×2 IMPLANT
NEEDLE HYPO 25GX1X1/2 BEV (NEEDLE) ×4 IMPLANT
NEEDLE HYPO 25X1 1.5 SAFETY (NEEDLE) ×4 IMPLANT
NS IRRIG 1000ML POUR BTL (IV SOLUTION) ×4 IMPLANT
PACK ORTHO EXTREMITY (CUSTOM PROCEDURE TRAY) ×4 IMPLANT
PAD ARMBOARD 7.5X6 YLW CONV (MISCELLANEOUS) ×8 IMPLANT
PAD CAST 4YDX4 CTTN HI CHSV (CAST SUPPLIES) IMPLANT
PADDING CAST COTTON 4X4 STRL (CAST SUPPLIES) ×4
SUCTION FRAZIER HANDLE 10FR (MISCELLANEOUS) ×2
SUCTION TUBE FRAZIER 10FR DISP (MISCELLANEOUS) ×2 IMPLANT
SUT ETHILON 4 0 FS 1 (SUTURE) ×2 IMPLANT
SUT ETHILON 4 0 P 3 18 (SUTURE) ×2 IMPLANT
SUT ETHILON 4 0 PS 2 18 (SUTURE) ×2 IMPLANT
SYR CONTROL 10ML LL (SYRINGE) ×4 IMPLANT
TOWEL OR 17X24 6PK STRL BLUE (TOWEL DISPOSABLE) ×4 IMPLANT
TOWEL OR 17X26 10 PK STRL BLUE (TOWEL DISPOSABLE) ×4 IMPLANT
TUBE CONNECTING 12'X1/4 (SUCTIONS) ×1
TUBE CONNECTING 12X1/4 (SUCTIONS) ×3 IMPLANT
UNDERPAD 30X30 INCONTINENT (UNDERPADS AND DIAPERS) ×4 IMPLANT

## 2016-01-08 NOTE — Transfer of Care (Signed)
Immediate Anesthesia Transfer of Care Note  Patient: Albert Richards  Procedure(s) Performed: Procedure(s): IRRIGATION AND DEBRIDEMENT LEFT THUMB (Left) AMPUTATION REVISION LEFT FIRST FINGER  (Left)  Patient Location: PACU  Anesthesia Type:General  Level of Consciousness: sedated and patient cooperative  Airway & Oxygen Therapy: Patient connected to nasal cannula oxygen  Post-op Assessment: Report given to RN and Post -op Vital signs reviewed and stable  Post vital signs: Reviewed and stable  Last Vitals:  Filed Vitals:   01/08/16 2000 01/08/16 2156  BP: 175/80   Pulse: 80   Temp:  36.3 C  Resp: 20     Last Pain:  Filed Vitals:   01/08/16 2159  PainSc: 10-Worst pain ever         Complications: No apparent anesthesia complications

## 2016-01-08 NOTE — Anesthesia Postprocedure Evaluation (Signed)
Anesthesia Post Note  Patient: Albert Richards  Procedure(s) Performed: Procedure(s) (LRB): IRRIGATION AND DEBRIDEMENT LEFT THUMB (Left) AMPUTATION REVISION LEFT FIRST FINGER  (Left)  Patient location during evaluation: PACU Anesthesia Type: General Level of consciousness: awake and alert Pain management: pain level controlled Vital Signs Assessment: post-procedure vital signs reviewed and stable Respiratory status: spontaneous breathing, nonlabored ventilation, respiratory function stable and patient connected to nasal cannula oxygen Cardiovascular status: blood pressure returned to baseline and stable Postop Assessment: no signs of nausea or vomiting Anesthetic complications: no    Last Vitals:  Filed Vitals:   01/08/16 2000 01/08/16 2156  BP: 175/80 151/84  Pulse: 80 83  Temp:  36.3 C  Resp: 20 13    Last Pain:  Filed Vitals:   01/08/16 2200  PainSc: 10-Worst pain ever                 Zenaida Deed

## 2016-01-08 NOTE — ED Provider Notes (Signed)
   Pt injured his left index finger just prior to arrival He is awake/alert Will need hand consult   Patient gave verbal permission to utilize photo for medical documentation only The image was not stored on any personal device   Ripley Fraise, MD 01/08/16 1855

## 2016-01-08 NOTE — Op Note (Signed)
See note [phone]

## 2016-01-08 NOTE — Consult Note (Signed)
Reason for Consult:left index amputation Referring Physician: MELCHIZEDEK ESPINOLA is an 75 y.o. male.  HPI: s/p left hand trauma with thumb volar laceration and pip level index amputation  Past Medical History  Diagnosis Date  . Hypothyroidism   . History of kidney stones 10-12 yrs ago  . Cancer [facility])     pancreatic cancer  . [facility] spotted fever   . 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 Pneumonia   . Arthritis     Past Surgical History  Procedure Laterality Date  . Tonsillectomy  as child  . Eus N/A 07/08/2013    Procedure: UPPER ENDOSCOPIC ULTRASOUND (EUS) LINEAR;  Surgeon: Milus Banister, MD;  Location: [facility];  Service: Endoscopy;  Laterality: N/A;  . Portacath placement N/A 07/30/2013    Procedure: INSERTION PORT-A-CATH;  Surgeon: Stark Klein, MD;  Location: [facility];  Service: General;  Laterality: N/A;    No family history on file.  Social History:  reports that he quit smoking about 10 years ago. His smoking use included Cigarettes. He started smoking about 60 years ago. He has a 50 pack-year smoking history. He has never used smokeless tobacco. He reports that he does not drink alcohol or use illicit drugs.  Allergies: No Known Allergies  Medications: Scheduled:   Results for orders placed or performed during the hospital encounter of 01/08/16 (from the past 48 hour(s))  CBC with Differential     Status: None   Collection Time: 01/08/16  6:57 PM  Result Value Ref Range   WBC 10.1 4.0 - 10.5 K/uL   RBC 4.64 4.22 - 5.81 MIL/uL   Hemoglobin 14.7 13.0 - 17.0 g/dL   HCT 44.3 39.0 - 52.0 %   MCV 95.5 78.0 - 100.0 fL   MCH 31.7 26.0 - 34.0 pg   MCHC 33.2 30.0 - 36.0 g/dL   RDW 13.6 11.5 - 15.5 %   Platelets 187 150 - 400 K/uL   Neutrophils Relative % 54 %   Neutro Abs 5.5 1.7 - 7.7 K/uL   Lymphocytes Relative 36 %   Lymphs Abs 3.7 0.7 - 4.0 K/uL   Monocytes Relative 9 %   Monocytes Absolute 0.9 0.1 - 1.0 K/uL    Eosinophils Relative 1 %   Eosinophils Absolute 0.1 0.0 - 0.7 K/uL   Basophils Relative 0 %   Basophils Absolute 0.0 0.0 - 0.1 K/uL  Comprehensive metabolic panel     Status: Abnormal   Collection Time: 01/08/16  6:57 PM  Result Value Ref Range   Sodium 140 135 - 145 mmol/L   Potassium 3.9 3.5 - 5.1 mmol/L   Chloride 108 101 - 111 mmol/L   CO2 24 22 - 32 mmol/L   Glucose, Bld 135 (H) 65 - 99 mg/dL   BUN 18 6 - 20 mg/dL   Creatinine, Ser 1.07 0.61 - 1.24 mg/dL   Calcium 9.0 8.9 - 10.3 mg/dL   Total Protein 6.8 6.5 - 8.1 g/dL   Albumin 3.8 3.5 - 5.0 g/dL   AST 19 15 - 41 U/L   ALT 15 (L) 17 - 63 U/L   Alkaline Phosphatase 121 38 - 126 U/L   Total Bilirubin 0.5 0.3 - 1.2 mg/dL   GFR calc non Af Amer >60 >60 mL/min   GFR calc Af Amer >60 >60 mL/min    Comment: (NOTE) The eGFR has been calculated using the CKD EPI equation. This calculation has not  been validated in all clinical situations. eGFR's persistently <60 mL/min signify possible Chronic Kidney Disease.    Anion gap 8 5 - 15  Protime-INR     Status: None   Collection Time: 01/08/16  6:57 PM  Result Value Ref Range   Prothrombin Time 13.7 11.6 - 15.2 seconds   INR 1.03 0.00 - 1.49    Dg Hand Complete Left  01/08/2016  CLINICAL DATA:  Left thumb laceration and amputation of a portion of the index finger of the left hand. EXAM: LEFT HAND - COMPLETE 3+ VIEW COMPARISON:  None. FINDINGS: There has been an amputation across the mid left index finger. There are comminuted bone fragments of the remaining base of the middle phalanx. There is a laceration across a portion of the head of the proximal phalanx. There is no radiopaque foreign body. There is a laceration to the soft tissues of the thumb adjacent to the IP joint. No fracture. No radiopaque foreign body. No other fractures.  No dislocation. There are degenerative changes most evident at the first carpal metacarpal articulation. IMPRESSION: 1. Amputation of the left index  finger at the level of the PIP joint. There are comminuted fracture fragments of the remaining base of the middle phalanx with a fracture across the head of the proximal phalanx. 2. Laceration to the thumb without associated fracture. 3. No radiopaque foreign bodies. Electronically Signed   By: Lajean Manes M.D.   On: 01/08/2016 19:17    Review of Systems  All other systems reviewed and are negative.  Blood pressure 166/90, pulse 79, temperature 97.9 F (36.6 C), temperature source Oral, resp. rate 18, height _0  (1.803 m), weight 99.791 kg (220 lb), SpO2 94 %. Physical Exam  Constitutional: He is oriented to person, place, and time. He appears well-developed and well-nourished.  HENT:  Head: Normocephalic and atraumatic.  Neck: Normal range of motion.  Cardiovascular: Normal rate.   Respiratory: Effort normal.  Musculoskeletal:       Left hand: He exhibits bony tenderness, deformity and laceration.  Left thumb volar laceration just at ipj and left index proximal p2 level amputation  Amputated part with significant damage and ribbon signs   Neurological: He is alert and oriented to person, place, and time.  Skin: Skin is warm.  Psychiatric: He has a normal mood and affect. His behavior is normal. Judgment and thought content normal.    Assessment/Plan: As above  Have discussed case with hand surgeon on call at [place] center who agrees that digit is not replantable due to age, dominance , level and type of injury and underlying medical issues  Will proceed with repair as needed thumb and revision of index  Kaysin Brock A 01/08/2016, 8:10 PM

## 2016-01-08 NOTE — ED Notes (Signed)
Pt amputated a large part of the pointer finger of the L hand at 6 pm.

## 2016-01-08 NOTE — ED Notes (Signed)
Family at bedside. 

## 2016-01-08 NOTE — ED Provider Notes (Signed)
CSN: [id number]     Arrival date & time 01/08/16  1831 History   First MD Initiated Contact with Patient 01/08/16 1839     Chief Complaint  Patient presents with  . Hand Injury    partial amputation   Patient is a 75 y.o. male presenting with hand injury.  Hand Injury Location:  Finger Time since incident:  1 hour Injury: yes   Mechanism of injury: amputation   Amputation:    Extent:  Complete   Cause:  [employer] part recovered: yes     Reported condition of body part:  Semi-intact   Preservation of body part:  On ice Finger location:  L index finger Pain details:    Quality:  Sharp   Radiates to:  Does not radiate   Severity:  Severe   Onset quality:  Sudden   Timing:  Constant Chronicity:  New Handedness:  Left-handed Dislocation: no   Foreign body present:  No foreign bodies Tetanus status:  Out of date Prior injury to area:  No Relieved by:  Nothing Worsened by:  Movement Ineffective treatments:  None tried Associated symptoms: decreased range of motion   Associated symptoms: no back pain, no fatigue, no fever, no muscle weakness, no neck pain, no numbness, no stiffness, no swelling and no tingling   Risk factors: no concern for non-accidental trauma, no known bone disorder and no frequent fractures    Pt was repairing a fence when he lost control of his tool and it amputated his finger. 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  Past Medical History  Diagnosis Date  . Hypothyroidism   . History of kidney stones 10-12 yrs ago  . Cancer [facility])     pancreatic cancer  . [facility] spotted fever   . 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 Pneumonia   . Arthritis    Past Surgical History  Procedure Laterality Date  . Tonsillectomy  as child  . Eus N/A 07/08/2013    Procedure: UPPER ENDOSCOPIC ULTRASOUND (EUS) LINEAR;  Surgeon: Milus Banister, MD;  [facility];  Service: Endoscopy;  Laterality: N/A;   . Portacath placement N/A 07/30/2013    Procedure: INSERTION PORT-A-CATH;  Surgeon: Stark Klein, MD;  Location: [place];  Service: General;  Laterality: N/A;   No family history on file. Social History  Substance Use Topics  . Smoking status: Former Smoker -- 1.00 packs/day for 50 years    Types: Cigarettes    Start date: 07/30/1955    Quit date: 07/29/2005  . Smokeless tobacco: Never Used  . Alcohol Use: No    Review of Systems  Constitutional: Negative for fever and fatigue.  Musculoskeletal: Negative for back pain, stiffness and neck pain.  All other systems reviewed and are negative.     Allergies  Review of patient's allergies indicates no known allergies.  Home Medications   Prior to Admission medications   Medication Sig Start Date End Date Taking? Authorizing Provider  amitriptyline (ELAVIL) 25 MG tablet Take 1 tablet (25 mg total) by mouth at bedtime. For sleep and will help nerve pain. 07/20/13   Lennis Marion Downer, MD  docusate sodium (COLACE) 100 MG capsule Take 100 mg by mouth daily.    Historical Provider, MD  HYDROcodone-acetaminophen (NORCO/VICODIN) 5-325 MG per tablet Take 1-2 tablets every 4-6 hours as needed for pain 07/20/13   Lennis Marion Downer, MD  levothyroxine (SYNTHROID, LEVOTHROID)  150 MCG tablet Take 150 mcg by mouth daily before breakfast.    Historical Provider, MD  omeprazole (PRILOSEC) 20 MG capsule Take 20 mg by mouth daily.    Historical Provider, MD  oxyCODONE-acetaminophen (ROXICET) 5-325 MG per tablet Take 1-2 tablets by mouth every 4 (four) hours as needed for severe pain. 07/30/13   Stark Klein, MD  oxyCODONE-acetaminophen (ROXICET) 5-325 MG tablet Take 1 tablet by mouth every 4 (four) hours as needed for severe pain. 01/08/16   Charlotte Crumb, MD  Tamsulosin HCl (FLOMAX) 0.4 MG CAPS Take 0.4 mg by mouth every morning.    Historical Provider, MD   BP 184/97 mmHg  Pulse 93  Temp(Src) 98.3 F (36.8 C) (Oral)  Resp 20  Ht 5\' 11"  (1.803 m)  Wt  99.791 kg  BMI 30.70 kg/m2  SpO2 98% Physical Exam  Constitutional: He appears well-developed and well-nourished. No distress.  HENT:  Head: Normocephalic and atraumatic.  Left Ear: External ear normal.  Eyes: Conjunctivae are normal. Pupils are equal, round, and reactive to light. Right eye exhibits no discharge. Left eye exhibits no discharge.  Neck: Normal range of motion. Neck supple.  Cardiovascular: Normal rate and regular rhythm.   No murmur heard. Pulmonary/Chest: Effort normal and breath sounds normal. No respiratory distress.  Abdominal: Soft. Bowel sounds are normal. He exhibits no distension and no mass. There is no tenderness. There is no rebound and no guarding.  Neurological: He is alert.  Skin: Skin is warm. He is not diaphoretic.  Psychiatric: He has a normal mood and affect.   See image in attendings note - left laceration along first digit, hemostatic, approx 3cm length, deep to dermis, no FB, no arterial flow. Good cap refill. Left second digit with with amputation at the level of the first PIP, hemostatic, no arterial flow.  Tender around wounds in left hand but not tender at any other MSK site. No wounds otherwise or tenderness noted at face, scalp, neck, spine, back, chest, abdomen, pelvis or lower extremities.  ED Course  Procedures (including critical care time) Labs Review Labs Reviewed  COMPREHENSIVE METABOLIC PANEL - Abnormal; Notable for the following:    Glucose, Bld 135 (*)    ALT 15 (*)    All other components within normal limits  CBC WITH DIFFERENTIAL/PLATELET  PROTIME-INR    Imaging Review Dg Hand Complete Left  01/08/2016  CLINICAL DATA:  Left thumb laceration and amputation of a portion of the index finger of the left hand. EXAM: LEFT HAND - COMPLETE 3+ VIEW COMPARISON:  None. FINDINGS: There has been an amputation across the mid left index finger. There are comminuted bone fragments of the remaining base of the middle phalanx. There is a  laceration across a portion of the head of the proximal phalanx. There is no radiopaque foreign body. There is a laceration to the soft tissues of the thumb adjacent to the IP joint. No fracture. No radiopaque foreign body. No other fractures.  No dislocation. There are degenerative changes most evident at the first carpal metacarpal articulation. IMPRESSION: 1. Amputation of the left index finger at the level of the PIP joint. There are comminuted fracture fragments of the remaining base of the middle phalanx with a fracture across the head of the proximal phalanx. 2. Laceration to the thumb without associated fracture. 3. No radiopaque foreign bodies. Electronically Signed   By: Lajean Manes M.D.   On: 01/08/2016 19:17   I have personally reviewed and evaluated these images  and lab results as part of my medical decision-making.   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 Hand surgery consultation. Ancef administered, tetanus administered. Patient has completely amputation and fracture. Hemostatic, preoperative laboratory evaluation obtained. Patient taken to operating room by hand surgery.    Karma Greaser, MD 01/09/16 [id number]  Ripley Fraise, MD 01/09/16 [phone]

## 2016-01-08 NOTE — Anesthesia Procedure Notes (Signed)
Procedure Name: Intubation Date/Time: 01/08/2016 8:59 PM Performed by: Valetta Fuller Pre-anesthesia Checklist: Patient identified, Emergency Drugs available, Suction available and Patient being monitored Patient Re-evaluated:Patient Re-evaluated prior to inductionOxygen Delivery Method: Circle system utilized Preoxygenation: Pre-oxygenation with 100% oxygen Intubation Type: IV induction Ventilation: Mask ventilation without difficulty Laryngoscope Size: Mac and 4 Grade View: Grade II Tube type: Oral Tube size: 8.0 mm Number of attempts: 3 Airway Equipment and Method: Bougie stylet Placement Confirmation: ETT inserted through vocal cords under direct vision,  positive ETCO2 and breath sounds checked- equal and bilateral Secured at: 23 cm Tube secured with: Tape Dental Injury: Teeth and Oropharynx as per pre-operative assessment  Comments: DL with Miller 2 and Mac 4 by TG, CRNA. Arytenoids only visible. DL with Mac 4 by Dr. Veatrice Kells, arytenoids visible, Bougie stylette employed for nontraumatic intubation. Easily mask ventilated between intubation attempts.

## 2016-01-08 NOTE — Anesthesia Preprocedure Evaluation (Addendum)
Anesthesia Evaluation  Patient identified by MRN, date of birth, ID band Patient awake    Reviewed: Allergy & Precautions, H&P , NPO status , Patient's Chart, lab work & pertinent test results  History of Anesthesia Complications Negative for: history of anesthetic complications  Airway Mallampati: I  TM Distance: >3 FB Neck ROM: Full    Dental  (+) Teeth Intact, Dental Advisory Given   Pulmonary pneumonia, resolved, COPD, former smoker,    Pulmonary exam normal breath sounds clear to auscultation       Cardiovascular + Peripheral Vascular Disease   Rhythm:Regular Rate:Normal     Neuro/Psych negative neurological ROS     GI/Hepatic Neg liver ROS, GERD  Controlled and Medicated,Weight loss over 4 months -- 25 pounds Stage 4 pancreatic cancer   Endo/Other  Hypothyroidism   Renal/GU      Musculoskeletal negative musculoskeletal ROS (+)   Abdominal   Peds  Hematology negative hematology ROS (+)   Anesthesia Other Findings   Reproductive/Obstetrics                          Anesthesia Physical  Anesthesia Plan  ASA: III  Anesthesia Plan: General   Post-op Pain Management:    Induction: Intravenous  Airway Management Planned: Oral ETT  Additional Equipment:   Intra-op Plan:   Post-operative Plan: Extubation in OR  Informed Consent: I have reviewed the patients History and Physical, chart, labs and discussed the procedure including the risks, benefits and alternatives for the proposed anesthesia with the patient or authorized representative who has indicated his/her understanding and acceptance.   Dental advisory given  Plan Discussed with: CRNA and Surgeon  Anesthesia Plan Comments:         Anesthesia Quick Evaluation

## 2016-01-09 ENCOUNTER — Encounter (HOSPITAL_COMMUNITY): Payer: Self-pay | Admitting: Orthopedic Surgery

## 2016-01-09 NOTE — Op Note (Signed)
NAMEMATHEW, Albert Richards             ACCOUNT NO.:  [id number]  MEDICAL RECORD NO.:  [id number]  LOCATION:  [facility]                         FACILITY:  [facility]  PHYSICIAN:  Sheral Apley. [name], M.D.DATE OF BIRTH:  [birth year]  DATE OF PROCEDURE:  01/08/2016 DATE OF DISCHARGE:  01/08/2016                              OPERATIVE REPORT   PREOPERATIVE DIAGNOSIS:  Traumatic injury, left hand with complex laceration of volar aspect of left thumb interphalangeal joint flexion crease and left index finger PIP joint proximal phalangeal level amputation.  POSTOPERATIVE DIAGNOSIS:  Traumatic injury, left hand with complex laceration of volar aspect of left thumb interphalangeal joint flexion crease and left index finger PIP joint proximal phalangeal level amputation.  PROCEDURE:  Incision and drainage of above with repair of complex laceration, 4 cm in length palmar aspect, left thumb and revision amputation of left index finger proximal phalangeal level amputation with bilateral neurectomies and volar flap.  SURGEON:  Keondria Siever.  ASSISTANT:  None.  ANESTHESIA:  General.  COMPLICATIONS:  No complications.  DRAINS:  No drains.  DESCRIPTION OF PROCEDURE:  The patient was taken to the operating suite. After induction of adequate general anesthetic, left upper extremity was prepped and draped in sterile fashion.  An Esmarch was used to exsanguinate the limb.  Tourniquet was inflated to 250 mmHg.  At this point, complex laceration of volar aspect was approached surgically.  It was oblique in nature.  The FPL tendon was intact.  The radial neurovascular bundle was intact.  Ulnar neurovascular bundle had some contusion to the digital nerve and artery, but they appeared to be intact.  The wound was irrigated with a liter of normal saline.  We then loosely closed with a complex wound with 4-0 nylon.  Once this was done, we approached the index finger.  There was an amputation of the index finger at  the level of the proximal phalangeal joint.  There was an oblique fracture of the proximal phalangeal head with loss of one of the condyles and remnants at the base of the middle phalanx.  We carefully used a 15 blade.  We dissected out the bony fragments of the middle phalanx.  These were excised.  We then used a rongeur to even out the distal aspect of the proximal phalanx.  We then debrided the flexor tendons and the extensor tendons down to stable tissue and performed bilateral digital neurectomies.  Once this was completed, this was irrigated.  We loosely closed this with a volar flap cutting off a dog ear both at the ulnar and radial sides.  This was done with 4 nylon.  At the end of procedure, we performed a median nerve block at the wrist with 0.25% Marcaine 10 mL.  We then dressed the patient with Xeroform, 4x4s, and a compressive wrap.  The patient tolerated all procedures well and went to the recovery room in stable fashion.     Sheral Apley Burney Gauze, M.D.     MAW/MEDQ  D:  01/08/2016  T:  01/09/2016  Job:  [id number]

## 2016-01-23 ENCOUNTER — Telehealth: Payer: Self-pay | Admitting: *Deleted

## 2016-01-23 NOTE — Telephone Encounter (Signed)
TC received from pt's wife. She states pt has been seeing Dr. Tressie Stalker in [place] but now that practice is closing as of 01/24/16. She was told that Dr. Tressie Stalker is referring her husband to Dr. Benay Spice and that his medical records were fax'd to this office yesterday, 01/22/16.  Mrs. Nicholes wanted to know if an appointment has been made for her husband yet. Advised Mrs. Feltman that there is not an appointment but that we would be in touch with her as soon as possible.  In-basket message sent to Johnnette Gourd, Engineer, building services.

## 2016-01-24 ENCOUNTER — Telehealth: Payer: Self-pay | Admitting: Oncology

## 2016-01-24 ENCOUNTER — Encounter: Payer: Self-pay | Admitting: Oncology

## 2016-01-24 NOTE — Telephone Encounter (Signed)
Pt confirmed appt and completed intake. Letter was mailed out

## 2016-02-12 ENCOUNTER — Telehealth: Payer: Self-pay | Admitting: Oncology

## 2016-02-12 ENCOUNTER — Encounter: Payer: Self-pay | Admitting: *Deleted

## 2016-02-12 ENCOUNTER — Ambulatory Visit (HOSPITAL_BASED_OUTPATIENT_CLINIC_OR_DEPARTMENT_OTHER): Payer: Medicare Other | Admitting: Oncology

## 2016-02-12 VITALS — BP 136/68 | HR 63 | Temp 98.0°F | Resp 18 | Ht 71.0 in | Wt 227.2 lb

## 2016-02-12 DIAGNOSIS — E119 Type 2 diabetes mellitus without complications: Secondary | ICD-10-CM

## 2016-02-12 DIAGNOSIS — C252 Malignant neoplasm of tail of pancreas: Secondary | ICD-10-CM

## 2016-02-12 DIAGNOSIS — E039 Hypothyroidism, unspecified: Secondary | ICD-10-CM

## 2016-02-12 DIAGNOSIS — C259 Malignant neoplasm of pancreas, unspecified: Secondary | ICD-10-CM

## 2016-02-12 DIAGNOSIS — G620 Drug-induced polyneuropathy: Secondary | ICD-10-CM | POA: Diagnosis not present

## 2016-02-12 NOTE — Telephone Encounter (Signed)
Gave pt cal & avs °

## 2016-02-12 NOTE — Progress Notes (Signed)
[place] New Patient Consult   Referring MD: Byntlee Carel 75 y.o.  [birth year]    Reason for Referral: Pancreas cancer   HPI: Albert Richards reports developing upper abdominal pain in 2014. A CT on 06/23/2013 revealed a pancreas tail abnormality suspicious for malignancy. Infiltrative changes were noted in the greater omentum concerning for malignant involvement. Multiple lung base nodules were also noted. He was referred to Dr. Marko Plume. The CA 19-9 was elevated. He was referred to Dr. Arnoldo Morale and was taken to an endoscopic ultrasound procedure on 07/08/2013. A 2.7 x 3 cm mass was noted in the tail the pancreas with involvement of the splenic vessels. No adenopathy. The mass was biopsied and the cytology confirmed malignant cells consistent with adenocarcinoma.  Albert Richards was referred to Dr. Tressie Stalker. He has been treated with multiple courses of systemic chemotherapy over the past 2-1/2 years. He was initially treated with FOLFIRINOX beginning in January 2015. FOLFIRINOX was discontinued in July 2015 after initial clinical improvement. In November 2015 the CA 19-9 increased. Chemotherapy was switched to gemcitabine/Abraxane and given between December 2015 and May 2016. He entered clinical remission. Chemotherapy was discontinued in May 2016. Dr. Tressie Stalker noted greater than 75% reduction the CA 19-9 and a response in the lungs and peritoneal cavity with no visible tumor in the pancreas.  In August 2016 FOLFIRINOX was reinitiated. He was last treated with FOLFIRINOX in December 2016.  He has been maintained off of specific therapy since 07/26/2015. A restaging CT on 12/29/2015 showed 2 enlarged lung nodules and the CA 19-9 is higher.  Albert Richards was referred to Dr. Pia Mau at [facility] on 01/15/2016. He was not eligible for a current clinical trial at [facility] Dr. Pia Mau recommended FOLFIRI or gemcitabine/Abraxane.  Albert Richards reports feeling well. He is  referred to continue Oncology care in [place] since the [place] clinic is closing.   Past Medical History  Diagnosis Date  . Hypothyroidism   . History of kidney stones 10-12 yrs ago  . Cancer [facility]) December 2014     pancreatic cancer-Pancreas tail, stage IV   . [facility] spotted fever   . 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 Pneumonia   . Arthritis      .  Diabetes   Past Surgical History  Procedure Laterality Date  . Tonsillectomy  as child  . Eus N/A 07/08/2013    Procedure: UPPER ENDOSCOPIC ULTRASOUND (EUS) LINEAR;  Surgeon: Milus Banister, MD;  [facility];  Service: Endoscopy;  Laterality: N/A;  . Portacath placement N/A 07/30/2013    Procedure: INSERTION PORT-A-CATH;  Surgeon: Stark Klein, MD;  Location: [place];  Service: General;  Laterality: N/A;  . I&d extremity Left 01/08/2016    Procedure: IRRIGATION AND DEBRIDEMENT LEFT THUMB;  Surgeon: Charlotte Crumb, MD;  Location: [place];  Service: Orthopedics;  Laterality: Left;  . Amputation Left 01/08/2016    Procedure: AMPUTATION REVISION LEFT FIRST FINGER ;  Surgeon: Charlotte Crumb, MD;  Location: [place];  Service: Orthopedics;  Laterality: Left;    Medications: Reviewed  Allergies: No Known Allergies  Family history: His paternal grandmother died of "stomach cancer ". No other family history of cancer   Social History:   He lives in [place]. 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 He quit smoking cigarettes 10 years ago. He reports rare alcohol use. No transfusion history. No risk factor for HIV or hepatitis.  ROS:   Positives include:Nocturia, numbness in the fingers and feet following oxaliplatin chemotherapy, balance difficulty, multiple "scrapes "over the legs   A complete ROS was otherwise negative.  Physical Exam:  Blood pressure 136/68, pulse 63, temperature 98 F (36.7 C), temperature source Oral, resp. rate 18, height 5\' 11"  (1.803 m), weight 227 lb  3.2 oz (103.057 kg), SpO2 95 %.  HEENT: Oropharynx without visible mass, neck without mass Lungs: Clear bilaterally  Cardiac: Regular rate and rhythm Abdomen: No mass, no apparent ascites, no hepatosplenomegaly, nontender GU:  testes without mass  Vascular: No leg edema  Lymph nodes: No cervical, supraclavicular, axillary, or inguinal nodes  Neurologic:Alert and oriented, the motor exam appears intact in the upper and lower extremities  Skin: Multiple abrasions at the lower legs bilaterally, no rash Musculoskeletal: No spine tenderness   LAB:  CBC  Lab Results  Component Value Date   WBC 10.1 01/08/2016   HGB 14.7 01/08/2016   HCT 44.3 01/08/2016   MCV 95.5 01/08/2016   PLT 187 01/08/2016   NEUTROABS 5.5 01/08/2016     CMP      Component Value Date/Time   NA 140 01/08/2016 1857   NA 142 07/20/2013 1432   K 3.9 01/08/2016 1857   K 4.0 07/20/2013 1432   CL 108 01/08/2016 1857   CO2 24 01/08/2016 1857   CO2 27 07/20/2013 1432   GLUCOSE 135* 01/08/2016 1857   GLUCOSE 177* 07/20/2013 1432   BUN 18 01/08/2016 1857   BUN 20.4 07/20/2013 1432   CREATININE 1.07 01/08/2016 1857   CREATININE 0.9 07/20/2013 1432   CALCIUM 9.0 01/08/2016 1857   CALCIUM 8.8 07/20/2013 1432   PROT 6.8 01/08/2016 1857   PROT 6.7 07/20/2013 1432   ALBUMIN 3.8 01/08/2016 1857   ALBUMIN 3.4* 07/20/2013 1432   AST 19 01/08/2016 1857   AST 15 07/20/2013 1432   ALT 15* 01/08/2016 1857   ALT 15 07/20/2013 1432   ALKPHOS 121 01/08/2016 1857   ALKPHOS 81 07/20/2013 1432   BILITOT 0.5 01/08/2016 1857   BILITOT 0.40 07/20/2013 1432   GFRNONAA >60 01/08/2016 1857   GFRAA >60 01/08/2016 1857      Imaging:  CT chest, abdomen, and pelvis 12/29/1998 17-9 millimeter right upper lobe nodule, and the millimeter subpleural left lower lobe nodule, no focal liver abnormality, no pancreas mass,, unchanged tiny nodules in the right omentum, mild enlargement of the prostate gland (images not available for  review today)    Assessment/Plan:   1. Stage IV pancreas cancer  pancreas tail mass confirmed on EUS 07/08/2013, biopsy revealed adenocarcinoma  PET scan 07/20/2013-malignant range uptake in the omentum, hypermetabolic mass in the pancreas tail, multiple pulmonary nodules without associated increased FDG activity  FOLFIRINOX 08/10/2013-02/01/2014 with a clinical response    gemcitabine/Abraxane 06/29/2014-12/13/2014 with a decreased CA 19-9 and response in the lungs and peritoneal cavity, no visible tumor in the pancreas  FOLFIRINOX resumed 03/07/2015-07/26/2015  CT 12/29/2015 showed enlargement of 2 lung nodules, CA 19-9 rising  2.   Diabetes  3.   Hypothyroidism  4.   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   History of kidney stones   Disposition:   Albert Richards has metastatic pancreas cancer. This diagnosis dates to December 2014. He has been treated with FOLFIRINOX and gemcitabine/Abraxane with clinical, radiologic, and CA 19-9 improvement. He has been maintained off of specific therapy since December 2016.  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  He appears asymptomatic from the pancreas cancer at present.  I discussed treatment options with Albert Richards and his family. We discussed observation and resuming systemic chemotherapy. He is a candidate to resume treatment with FOLFIRI, liposomal irinotecan, or gemcitabine/Abraxane.  I recommend continued observation since he is asymptomatic and with a minimal tumor burden. We will check foundation 1 and PDL 1 testing on the pancreas biopsy. If there is not enough tissue present for testing we will submit peripheral blood DNA testing.  Albert Richards will return for a Port-A-Cath flush and CA 19-9 in 2 weeks. He will be scheduled for an office visit in 4 weeks.  Approximately 50 minutes were spent with the patient today. The majority of the time was used for counseling and  coordination of care.   Betsy Coder, MD  02/12/2016, 5:17 PM

## 2016-02-12 NOTE — Progress Notes (Signed)
Oncology Nurse Navigator Documentation  Oncology Nurse Navigator Flowsheets 02/12/2016  Navigator Location [facility]  Navigator Encounter Type Initial MedOnc  Abnormal Finding Date 07/08/2013  Confirmed Diagnosis Date 07/08/2013  Treatment Initiated Date 08/10/2013  Patient Visit Type MedOnc;Initial  Treatment Phase Other--s/p chemo--observation  Barriers/Navigation Needs Education;Coordination of Care  Education Understanding Cancer/ Treatment Options  Interventions Coordination of Care--email request to [facility] for Foundation One Testing  Coordination of Care Other--Transfer care to [place] from [place]  Acuity Level 1  Time Spent with Patient 80  Met with patient, wife and daughter during new patient visit. Explained the role of the GI Nurse Navigator and provided New Patient Packet with information on: 1. Support groups 2. Fall Safety Plan Answered questions, reviewed current treatment plan using TEACH back and provided emotional support. Provided copy of current treatment plan. Wife will obtain CD of last scan from [facility] and bring to his 2 week lab appointment. Will order Foundation One blood testing if not enough tissue from his biopsy tissue. Provided verbal/written/teachback instructions on how to wean off the gabapentin. Email to [employer] for [employer] One and PDL-1 testing on the following case:  Patient: Albert Richards, Albert Richards Collected: 07/08/2013 Client: [facility] Accession: [id number] Received: 07/08/2013 Owens Loffler DOB: [birth year] Age: 75 Gender: M Reported: 07/09/2013 [address] Patient Ph: [phone] MRN#: [id number] [place], [place] [address] Client Acc#: Chart: Phone: [phone] Fax: LMP: Visit#: [id number] CC: CYTOPATHOLOGY  Merceda Elks, RN, BSN GI Oncology [place]

## 2016-02-12 NOTE — Patient Instructions (Signed)
Care Plan Summary- 02/12/2016 Name: Gegory Baldassari         DOB: 1941/04/22   Your Medical Team: Medical Oncologist:  Dr. Ma Rings Radiation Oncologist:   Surgeon:    Dr. 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   Recommendations: Based on information available as of today's consult. Recommendations may change depending on the results of further tests or exams. 1) Observation for now: later can start FOLFIRI with liposomal irinotecan vs. Gemzar/Abraxane again. Oral Xeloda has < 5% chance of working 2) Wean off Gabapentin since not having neuropathic pain- 3) Will send 2014 tissue for Foundation One Testing if enough sample is available Next Steps: 1) Obtain CD from [facility] and bring in 2 weeks with lab/flush appointment 2) Gabapentin: 600mg  at bedtime X 3 nights, then 600 mg every other night for 3 doses, then stop 3) Call for fever or shortness of breath. Questions? Merceda Elks, RN, BSN at [phone].

## 2016-02-15 ENCOUNTER — Other Ambulatory Visit: Payer: Self-pay | Admitting: *Deleted

## 2016-02-15 DIAGNOSIS — C259 Malignant neoplasm of pancreas, unspecified: Secondary | ICD-10-CM

## 2016-02-15 NOTE — Progress Notes (Signed)
  Oncology Nurse Navigator Documentation  Navigator Location: [facility] (02/15/16 1455) Navigator Encounter Type: Letter/Fax/Email (02/15/16 1455)   Insufficient tissue for Foundation One Testing on his cytology.                 Interventions: Other (02/15/16 1455)   Order placed for Foundation[facility] testing to include PDL-1 testing and paperwork completed and placed on MD desk for signature. Patient notified it will be drawn on 7/31 via home VM>

## 2016-02-26 ENCOUNTER — Other Ambulatory Visit (HOSPITAL_BASED_OUTPATIENT_CLINIC_OR_DEPARTMENT_OTHER): Payer: Medicare Other

## 2016-02-26 ENCOUNTER — Encounter: Payer: Self-pay | Admitting: *Deleted

## 2016-02-26 ENCOUNTER — Ambulatory Visit (HOSPITAL_BASED_OUTPATIENT_CLINIC_OR_DEPARTMENT_OTHER): Payer: Medicare Other

## 2016-02-26 DIAGNOSIS — E039 Hypothyroidism, unspecified: Secondary | ICD-10-CM | POA: Diagnosis not present

## 2016-02-26 DIAGNOSIS — C252 Malignant neoplasm of tail of pancreas: Secondary | ICD-10-CM

## 2016-02-26 DIAGNOSIS — C259 Malignant neoplasm of pancreas, unspecified: Secondary | ICD-10-CM

## 2016-02-26 LAB — TSH: TSH: 0.452 m[IU]/L (ref 0.320–4.118)

## 2016-02-26 MED ORDER — HEPARIN SOD (PORK) LOCK FLUSH 100 UNIT/ML IV SOLN
500.0000 [IU] | Freq: Once | INTRAVENOUS | Status: AC | PRN
  Administered 2016-02-26: 500 [IU] via INTRAVENOUS
  Filled 2016-02-26: qty 5

## 2016-02-26 MED ORDER — SODIUM CHLORIDE 0.9 % IJ SOLN
10.0000 mL | INTRAMUSCULAR | Status: DC | PRN
  Administered 2016-02-26: 10 mL via INTRAVENOUS
  Filled 2016-02-26: qty 10

## 2016-02-26 NOTE — Progress Notes (Signed)
  Oncology Nurse Navigator Documentation  Navigator Location: [facility] (02/26/16 1237) Navigator Encounter Type: Letter/Fax/Email (02/26/16 [id number])   Gave PAP application for Foundation One for patient to complete as well as billing process for company. Wife brought in his CD from [place] of his last CT scan. Report given to collaborative nurse and CD taken to radiology to be loaded into system/

## 2016-02-27 LAB — CANCER ANTIGEN 19-9: CAN 19-9: 936 U/mL — AB (ref 0–35)

## 2016-02-28 ENCOUNTER — Telehealth: Payer: Self-pay | Admitting: *Deleted

## 2016-02-28 NOTE — Telephone Encounter (Signed)
Message from pt's wife requesting tumor marker results. CA19-9 result is 936. MD has released result to MyChart. Left message at home number informing them results were released.  Spoke with pt but he couldn't hear due to poor cellular reception.  Per Dr. Benay Spice: Will await Foundation One results.

## 2016-03-11 ENCOUNTER — Telehealth: Payer: Self-pay | Admitting: *Deleted

## 2016-03-11 ENCOUNTER — Ambulatory Visit (HOSPITAL_BASED_OUTPATIENT_CLINIC_OR_DEPARTMENT_OTHER): Payer: Medicare Other | Admitting: Oncology

## 2016-03-11 ENCOUNTER — Telehealth: Payer: Self-pay | Admitting: Oncology

## 2016-03-11 VITALS — BP 146/67 | HR 65 | Temp 98.0°F | Resp 18 | Ht 71.0 in | Wt 225.4 lb

## 2016-03-11 DIAGNOSIS — E039 Hypothyroidism, unspecified: Secondary | ICD-10-CM

## 2016-03-11 DIAGNOSIS — C252 Malignant neoplasm of tail of pancreas: Secondary | ICD-10-CM | POA: Diagnosis not present

## 2016-03-11 DIAGNOSIS — E119 Type 2 diabetes mellitus without complications: Secondary | ICD-10-CM

## 2016-03-11 DIAGNOSIS — R918 Other nonspecific abnormal finding of lung field: Secondary | ICD-10-CM

## 2016-03-11 DIAGNOSIS — Z87442 Personal history of urinary calculi: Secondary | ICD-10-CM

## 2016-03-11 DIAGNOSIS — C259 Malignant neoplasm of pancreas, unspecified: Secondary | ICD-10-CM

## 2016-03-11 NOTE — Telephone Encounter (Signed)
Oncology Nurse Navigator Documentation  Oncology Nurse Navigator Flowsheets 03/11/2016  Navigator Location [facility]  Navigator Encounter Type Letter/Fax/Email  Abnormal Finding Date -  Confirmed Diagnosis Date -  Treatment Initiated Date -  Patient Visit Type -  Treatment Phase -  Barriers/Navigation Needs Financial  Education -  Interventions Other--sent completed PAP application to [facility] website and test results are still in process  Support Groups/Services -  Acuity Level 1  Time Spent with Patient 15

## 2016-03-11 NOTE — Telephone Encounter (Signed)
GAVE PATIENT AVS REPORT AND APPOINTMENTS FOR September AND OCTOBER

## 2016-03-11 NOTE — Progress Notes (Addendum)
  [facility] OFFICE PROGRESS NOTE   Diagnosis: Pancreas cancer  INTERVAL HISTORY:   Mr. Albert Richards returns as scheduled. He feels well. No pain. Good appetite. He is working. Mild numbness in the extremities. He had increased knee pain when he discontinued gabapentin. The gabapentin was resumed. He reports the blood sugar has been running low, 7-90 range, since he began drinking and herbal tea. He is taking glipizide once daily. He feels weak when the blood sugar is low.  Objective:  Vital signs in last 24 hours:  Blood pressure (!) 146/67, pulse 65, temperature 98 F (36.7 C), temperature source Oral, resp. rate 18, height 5\' 11"  (1.803 m), weight 225 lb 6.4 oz (102.2 kg), SpO2 96 %.   Resp: Lungs clear bilaterally Cardio: Regular rate and rhythm GI: No hepatosplenomegaly, nontender, no mass Vascular: No leg edema  Portacath/PICC-without erythema  Lab Results:  CA 19-9 on 02/26/2016: 936   Imaging:  No results found.  Medications: I have reviewed the patient's current medications.  Assessment/Plan:  1. Stage IV pancreas cancer ? pancreas tail mass confirmed on EUS 07/08/2013, biopsy revealed adenocarcinoma ? PET scan 07/20/2013-malignant range uptake in the omentum, hypermetabolic mass in the pancreas tail, multiple pulmonary nodules without associated increased FDG activity ? FOLFIRINOX 08/10/2013-02/01/2014 with a clinical response  ?  gemcitabine/Abraxane 06/29/2014-12/13/2014 with a decreased CA 19-9 and response in the lungs and peritoneal cavity, no visible tumor in the pancreas ? FOLFIRINOX resumed 03/07/2015-07/26/2015 ? CT 12/29/2015 showed enlargement of 2 lung nodules, CA 19-9 rising  2.   Diabetes-glipizide placed on hold 03/11/2016  3.   Hypothyroidism  4.   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   History of kidney stones   Disposition:  Ms. to [place] appears stable. He will return for a CA 19-9  and Port-A-Cath flush in one month. He will be scheduled for a 2 month office visit. The plan is to schedule restaging CTs if the CA 19-9 is higher next month.  He will discontinue glipizide given the recent low blood sugar readings.  We are waiting on results from [facility] testing.  Betsy Coder, MD  03/11/2016  10:54 AM

## 2016-03-13 ENCOUNTER — Telehealth: Payer: Self-pay | Admitting: *Deleted

## 2016-03-13 NOTE — Telephone Encounter (Signed)
Received Foundation one result via fax. Placed on Dr. Gearldine Shown desk for review.

## 2016-03-18 ENCOUNTER — Telehealth: Payer: Self-pay | Admitting: *Deleted

## 2016-03-18 NOTE — Telephone Encounter (Signed)
Per Dr. Benay Spice, pt.'s wife notified that Foundation One testing is negative and shows no mutations.  Pt.'s wife appreciative of call.

## 2016-03-29 ENCOUNTER — Telehealth: Payer: Self-pay | Admitting: *Deleted

## 2016-03-29 NOTE — Telephone Encounter (Signed)
Fax from [facility]: Pt is eligible for financial assistance with his portion of the cost for testing.

## 2016-04-08 ENCOUNTER — Other Ambulatory Visit (HOSPITAL_BASED_OUTPATIENT_CLINIC_OR_DEPARTMENT_OTHER): Payer: Medicare Other

## 2016-04-08 ENCOUNTER — Ambulatory Visit (HOSPITAL_BASED_OUTPATIENT_CLINIC_OR_DEPARTMENT_OTHER): Payer: Medicare Other

## 2016-04-08 DIAGNOSIS — C252 Malignant neoplasm of tail of pancreas: Secondary | ICD-10-CM

## 2016-04-08 DIAGNOSIS — C259 Malignant neoplasm of pancreas, unspecified: Secondary | ICD-10-CM

## 2016-04-08 LAB — BASIC METABOLIC PANEL
Anion Gap: 8 mEq/L (ref 3–11)
BUN: 16.8 mg/dL (ref 7.0–26.0)
CALCIUM: 9.3 mg/dL (ref 8.4–10.4)
CHLORIDE: 106 meq/L (ref 98–109)
CO2: 26 meq/L (ref 22–29)
Creatinine: 1.1 mg/dL (ref 0.7–1.3)
EGFR: 66 mL/min/{1.73_m2} — AB (ref >90)
Glucose: 137 mg/dl (ref 70–140)
Potassium: 4.3 mEq/L (ref 3.5–5.1)
SODIUM: 140 meq/L (ref 136–145)

## 2016-04-08 MED ORDER — HEPARIN SOD (PORK) LOCK FLUSH 100 UNIT/ML IV SOLN
500.0000 [IU] | Freq: Once | INTRAVENOUS | Status: AC | PRN
  Administered 2016-04-08: 500 [IU] via INTRAVENOUS
  Filled 2016-04-08: qty 5

## 2016-04-08 MED ORDER — SODIUM CHLORIDE 0.9 % IJ SOLN
10.0000 mL | INTRAMUSCULAR | Status: DC | PRN
  Administered 2016-04-08: 10 mL via INTRAVENOUS
  Filled 2016-04-08: qty 10

## 2016-04-08 NOTE — Patient Instructions (Signed)

## 2016-04-09 LAB — CANCER ANTIGEN 19-9: CA 19-9: 2057 U/mL — ABNORMAL HIGH (ref 0–35)

## 2016-04-10 ENCOUNTER — Telehealth: Payer: Self-pay | Admitting: *Deleted

## 2016-04-10 NOTE — Telephone Encounter (Signed)
-----   Message from Ladell Pier, MD sent at 04/09/2016  8:19 PM EDT ----- Please call patient, ca19-9 higher, schedule CT CAP few days prior to next office

## 2016-04-10 NOTE — Telephone Encounter (Signed)
Spoke with pt's wife, they were aware of elevated CA19-9. (Viewed on MyChart.) Informed her radiology will contact them with CT appointments for a few days prior to next visit. Instructed her to call office with any questions/ new issues. She voiced understanding.

## 2016-04-11 ENCOUNTER — Other Ambulatory Visit: Payer: Self-pay | Admitting: *Deleted

## 2016-04-17 ENCOUNTER — Telehealth: Payer: Self-pay | Admitting: *Deleted

## 2016-04-17 DIAGNOSIS — C259 Malignant neoplasm of pancreas, unspecified: Secondary | ICD-10-CM

## 2016-04-17 NOTE — Telephone Encounter (Signed)
Spoke with wife and made her aware we will get his scans scheduled as soon as insurance authorizes it. Moved his 10/9 visit to 10/4 at 10:15 and she agrees with this change. Reports his pain is intermittent-few times a week and he does not need to be seen urgently.  Email to managed care to make PA a priority.

## 2016-04-17 NOTE — Addendum Note (Signed)
Addended by: Tania Ade on: 04/17/2016 02:43 PM   Modules accepted: Orders

## 2016-04-17 NOTE — Telephone Encounter (Signed)
Voice mail from wife that Albert Richards has been having more stomach pain in past 2 weeks. Requesting to have his CT scan done as soon as possible. Next appointment is 10/9 and hopes to have it done before this. Forwarded message to MD>

## 2016-04-18 ENCOUNTER — Telehealth: Payer: Self-pay | Admitting: *Deleted

## 2016-04-18 NOTE — Telephone Encounter (Signed)
Oncology Nurse Navigator Documentation  Oncology Nurse Navigator Flowsheets 04/18/2016  Navigator Location [facility]  Navigator Encounter Type Telephone  Telephone Outgoing Call;Appt Confirmation/Clarification  Abnormal Finding Date -  Confirmed Diagnosis Date -  Treatment Initiated Date -  Patient Visit Type -  Treatment Phase -  Barriers/Navigation Needs Coordination of Care--CT scan  Education -  Interventions Coordination of Care--after authorization completed scheduled CT CAP for 9/25 at 1215/1330/ NPO 4 hours prior and drink contrast at 1030 & 1130.  Coordination of Care Radiology  Support Groups/Services -  Acuity Level 2  Time Spent with Patient 15  Wife made aware of CT appointment. They will come in early at 64 to drink contrast here instead of make a trip in to pick it up. She requests to be called with results on 9/25 or 9/26.

## 2016-04-19 ENCOUNTER — Telehealth: Payer: Self-pay | Admitting: *Deleted

## 2016-04-19 NOTE — Telephone Encounter (Signed)
Son called stating that patient has been having nausea/voming with abdomen pain for a week and is getting worse. This pain has been keeping up at night. MD Benay Spice notified and instructed patient to go to the ED to be evaluated. Son verbalized understanding.

## 2016-04-22 ENCOUNTER — Encounter (HOSPITAL_COMMUNITY): Payer: Self-pay

## 2016-04-22 ENCOUNTER — Encounter (HOSPITAL_COMMUNITY)
Admission: RE | Admit: 2016-04-22 | Discharge: 2016-04-22 | Disposition: A | Discharge status: Home / Self Care | Payer: Medicare Other | Source: Ambulatory Visit | Attending: Oncology | Admitting: Oncology

## 2016-04-22 DIAGNOSIS — C259 Malignant neoplasm of pancreas, unspecified: Secondary | ICD-10-CM | POA: Diagnosis present

## 2016-04-22 HISTORY — DX: Type 2 diabetes mellitus without complications: E11.9

## 2016-04-22 MED ORDER — IOPAMIDOL (ISOVUE-300) INJECTION 61%
30.0000 mL | Freq: Once | INTRAVENOUS | Status: AC | PRN
  Administered 2016-04-22: 30 mL via ORAL

## 2016-04-22 MED ORDER — IOPAMIDOL (ISOVUE-300) INJECTION 61%
100.0000 mL | Freq: Once | INTRAVENOUS | Status: AC | PRN
  Administered 2016-04-22: 100 mL via INTRAVENOUS

## 2016-05-01 ENCOUNTER — Telehealth: Payer: Self-pay | Admitting: Oncology

## 2016-05-01 ENCOUNTER — Ambulatory Visit (HOSPITAL_BASED_OUTPATIENT_CLINIC_OR_DEPARTMENT_OTHER): Payer: Medicare Other | Admitting: Oncology

## 2016-05-01 VITALS — BP 145/73 | HR 62 | Temp 98.0°F | Resp 17 | Ht 71.0 in | Wt 223.5 lb

## 2016-05-01 DIAGNOSIS — R918 Other nonspecific abnormal finding of lung field: Secondary | ICD-10-CM

## 2016-05-01 DIAGNOSIS — Z87442 Personal history of urinary calculi: Secondary | ICD-10-CM

## 2016-05-01 DIAGNOSIS — E119 Type 2 diabetes mellitus without complications: Secondary | ICD-10-CM | POA: Diagnosis not present

## 2016-05-01 DIAGNOSIS — E039 Hypothyroidism, unspecified: Secondary | ICD-10-CM

## 2016-05-01 DIAGNOSIS — K669 Disorder of peritoneum, unspecified: Secondary | ICD-10-CM

## 2016-05-01 DIAGNOSIS — C259 Malignant neoplasm of pancreas, unspecified: Secondary | ICD-10-CM

## 2016-05-01 DIAGNOSIS — C252 Malignant neoplasm of tail of pancreas: Secondary | ICD-10-CM

## 2016-05-01 MED ORDER — HYDROCODONE-ACETAMINOPHEN 5-325 MG PO TABS: ORAL_TABLET | ORAL | 0 refills | Status: DC

## 2016-05-01 NOTE — Progress Notes (Signed)
  [facility] OFFICE PROGRESS NOTE   Diagnosis: Pancreas cancer  INTERVAL HISTORY:   Albert Richards returns as scheduled. He reports a good appetite. He is working. He had approximate 4 episodes of upper abdominal pain during the past month, relieved with belching. No difficulty with bowel function. No consistent pain.  Objective:  Vital signs in last 24 hours:  Blood pressure (!) 145/73, pulse 62, temperature 98 F (36.7 C), temperature source Oral, resp. rate 17, height 5\' 11"  (1.803 m), weight 223 lb 8 oz (101.4 kg), SpO2 97 %.    HEENT: Neck without mass Lymphatics: No cervical, supraclavicular, axillary, or inguinal nodes Resp: Lungs clear bilaterally Cardio: Regular rate and rhythm GI: No hepatomegaly, no apparent ascites, nontender, no mass Vascular: No leg edema  Portacath/PICC-without erythema  Lab Results:  Lab Results  Component Value Date   WBC 10.1 01/08/2016   HGB 14.7 01/08/2016   HCT 44.3 01/08/2016   MCV 95.5 01/08/2016   PLT 187 01/08/2016   NEUTROABS 5.5 01/08/2016   04/08/2016-CA 19-9: 2057   Imaging: CTs chest and abdomen from 04/22/2016-images reviewed with Albert Richards and his family  Medications: I have reviewed the patient's current medications.  Assessment/Plan: 1. Stage IV pancreas cancer ? pancreas tail mass confirmed on EUS 07/08/2013, biopsy revealed adenocarcinoma ? PET scan 07/20/2013-malignant range uptake in the omentum, hypermetabolic mass in the pancreas tail, multiple pulmonary nodules without associated increased FDG activity ? FOLFIRINOX 08/10/2013-02/01/2014 with a clinical response  ? gemcitabine/Abraxane 06/29/2014-12/13/2014 with a decreased CA 19-9 and response in the lungs and peritoneal cavity, no visible tumor in the pancreas ? FOLFIRINOX resumed 03/07/2015-07/26/2015 ? CT 12/29/2015 showed enlargement of 2 lung nodules, CA 19-9 rising ? Rising CA 19-9, CTs 04/22/2016, enlargement of multiple lung  nodules, no pancreas mass, mild increase in the size of 2 omental nodules-no new nodules  2. Diabetes-glipizide placed on hold 03/11/2016  3. Hypothyroidism  4. 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 History of kidney stones   Disposition:  Albert Richards appears unchanged. It is unclear whether the intermittent abdominal discomfort is related to the pancreas cancer. He will try over-the-counter antiacids in addition to the Prilosec he is taking. We will make a GI referral if the discomfort becomes more consistent.  I reviewed the CT images with him. He has tiny bilateral lung nodules that have enlarged and he has a few peritoneal nodules. I suspect he is asymptomatic from the pancreas cancer at present.  Foundation 1 peripheral blood testing was unsuccessful secondary to low DNA yield. We will plan for Foundation 1 testing if he has a repeat biopsy in the future.  I had a long discussion with Albert Richards his family regarding treatment options. I recommend observation. We will consider salvage chemotherapy options if he develops more significant progression on the next CT. I offered to refer him back for an opinion at the [facility] oncology service. He declined this for now.  Albert Richards will return for an office visit and repeat CA 19-9 in approximately 6 weeks.  Betsy Coder, MD  05/01/2016  10:17 AM

## 2016-05-01 NOTE — Telephone Encounter (Signed)
Avs report and  Appointment schedule, given to patient, per 05/01/16 los. °

## 2016-05-06 ENCOUNTER — Ambulatory Visit: Payer: Medicare Other | Admitting: Oncology

## 2016-05-13 ENCOUNTER — Telehealth: Payer: Self-pay | Admitting: Medical Oncology

## 2016-05-13 ENCOUNTER — Emergency Department (HOSPITAL_COMMUNITY): Payer: Medicare Other

## 2016-05-13 ENCOUNTER — Telehealth: Payer: Self-pay | Admitting: Gastroenterology

## 2016-05-13 ENCOUNTER — Encounter (HOSPITAL_COMMUNITY): Payer: Self-pay | Admitting: Emergency Medicine

## 2016-05-13 ENCOUNTER — Inpatient Hospital Stay (HOSPITAL_COMMUNITY)
Admission: EM | Admit: 2016-05-13 | Discharge: 2016-05-16 | DRG: 437 | Disposition: A | Discharge status: Home / Self Care | Payer: Medicare Other | Attending: Family Medicine | Admitting: Family Medicine

## 2016-05-13 DIAGNOSIS — Z79899 Other long term (current) drug therapy: Secondary | ICD-10-CM

## 2016-05-13 DIAGNOSIS — R109 Unspecified abdominal pain: Secondary | ICD-10-CM | POA: Diagnosis not present

## 2016-05-13 DIAGNOSIS — K56609 Unspecified intestinal obstruction, unspecified as to partial versus complete obstruction: Secondary | ICD-10-CM

## 2016-05-13 DIAGNOSIS — Z87442 Personal history of urinary calculi: Secondary | ICD-10-CM

## 2016-05-13 DIAGNOSIS — Z7984 Long term (current) use of oral hypoglycemic drugs: Secondary | ICD-10-CM

## 2016-05-13 DIAGNOSIS — E039 Hypothyroidism, unspecified: Secondary | ICD-10-CM | POA: Diagnosis present

## 2016-05-13 DIAGNOSIS — C259 Malignant neoplasm of pancreas, unspecified: Principal | ICD-10-CM | POA: Diagnosis present

## 2016-05-13 DIAGNOSIS — R197 Diarrhea, unspecified: Secondary | ICD-10-CM

## 2016-05-13 DIAGNOSIS — E119 Type 2 diabetes mellitus without complications: Secondary | ICD-10-CM | POA: Diagnosis not present

## 2016-05-13 DIAGNOSIS — Z923 Personal history of irradiation: Secondary | ICD-10-CM

## 2016-05-13 DIAGNOSIS — J449 Chronic obstructive pulmonary disease, unspecified: Secondary | ICD-10-CM | POA: Diagnosis present

## 2016-05-13 DIAGNOSIS — Z87891 Personal history of nicotine dependence: Secondary | ICD-10-CM

## 2016-05-13 DIAGNOSIS — R112 Nausea with vomiting, unspecified: Secondary | ICD-10-CM

## 2016-05-13 LAB — GLUCOSE, CAPILLARY
GLUCOSE-CAPILLARY: 117 mg/dL — AB (ref 65–99)
Glucose-Capillary: 113 mg/dL — ABNORMAL HIGH (ref 65–99)
Glucose-Capillary: 123 mg/dL — ABNORMAL HIGH (ref 65–99)

## 2016-05-13 LAB — URINE MICROSCOPIC-ADD ON

## 2016-05-13 LAB — COMPREHENSIVE METABOLIC PANEL
ALT: 21 U/L (ref 17–63)
ANION GAP: 9 (ref 5–15)
AST: 22 U/L (ref 15–41)
Albumin: 4.3 g/dL (ref 3.5–5.0)
Alkaline Phosphatase: 123 U/L (ref 38–126)
BUN: 23 mg/dL — ABNORMAL HIGH (ref 6–20)
CHLORIDE: 105 mmol/L (ref 101–111)
CO2: 26 mmol/L (ref 22–32)
Calcium: 9.2 mg/dL (ref 8.9–10.3)
Creatinine, Ser: 1.21 mg/dL (ref 0.61–1.24)
GFR, EST NON AFRICAN AMERICAN: 57 mL/min — AB (ref >60)
Glucose, Bld: 155 mg/dL — ABNORMAL HIGH (ref 65–99)
POTASSIUM: 4.1 mmol/L (ref 3.5–5.1)
Sodium: 140 mmol/L (ref 135–145)
Total Bilirubin: 0.9 mg/dL (ref 0.3–1.2)
Total Protein: 7.6 g/dL (ref 6.5–8.1)

## 2016-05-13 LAB — URINALYSIS, ROUTINE W REFLEX MICROSCOPIC
GLUCOSE, UA: NEGATIVE mg/dL
Hgb urine dipstick: NEGATIVE
Ketones, ur: NEGATIVE mg/dL
NITRITE: NEGATIVE
PH: 8 (ref 5.0–8.0)
PROTEIN: NEGATIVE mg/dL
Specific Gravity, Urine: 1.027 (ref 1.005–1.030)

## 2016-05-13 LAB — CBC
HEMATOCRIT: 48 % (ref 39.0–52.0)
Hemoglobin: 16.9 g/dL (ref 13.0–17.0)
MCH: 32.9 pg (ref 26.0–34.0)
MCHC: 35.2 g/dL (ref 30.0–36.0)
MCV: 93.6 fL (ref 78.0–100.0)
Platelets: 219 10*3/uL (ref 150–400)
RBC: 5.13 MIL/uL (ref 4.22–5.81)
RDW: 13.6 % (ref 11.5–15.5)
WBC: 13.1 10*3/uL — AB (ref 4.0–10.5)

## 2016-05-13 LAB — LIPASE, BLOOD: Lipase: 12 U/L (ref 11–51)

## 2016-05-13 MED ORDER — IOPAMIDOL (ISOVUE-300) INJECTION 61%
100.0000 mL | Freq: Once | INTRAVENOUS | Status: AC | PRN
  Administered 2016-05-13: 100 mL via INTRAVENOUS

## 2016-05-13 MED ORDER — ONDANSETRON HCL 4 MG/2ML IJ SOLN
4.0000 mg | Freq: Four times a day (QID) | INTRAMUSCULAR | Status: DC | PRN
  Administered 2016-05-14: 4 mg via INTRAVENOUS
  Filled 2016-05-13: qty 2

## 2016-05-13 MED ORDER — MORPHINE SULFATE (PF) 2 MG/ML IV SOLN
2.0000 mg | INTRAVENOUS | Status: DC | PRN
  Administered 2016-05-13: 4 mg via INTRAVENOUS
  Administered 2016-05-14: 2 mg via INTRAVENOUS
  Filled 2016-05-13: qty 1
  Filled 2016-05-13 (×2): qty 2

## 2016-05-13 MED ORDER — INSULIN ASPART 100 UNIT/ML ~~LOC~~ SOLN
0.0000 [IU] | SUBCUTANEOUS | Status: DC
  Administered 2016-05-14: 1 [IU] via SUBCUTANEOUS
  Administered 2016-05-15: 2 [IU] via SUBCUTANEOUS
  Administered 2016-05-15: 1 [IU] via SUBCUTANEOUS

## 2016-05-13 MED ORDER — SODIUM CHLORIDE 0.9 % IV SOLN
INTRAVENOUS | Status: DC
  Administered 2016-05-13 – 2016-05-15 (×3): via INTRAVENOUS

## 2016-05-13 MED ORDER — ONDANSETRON HCL 4 MG/2ML IJ SOLN
4.0000 mg | Freq: Once | INTRAMUSCULAR | Status: AC
  Administered 2016-05-13: 4 mg via INTRAVENOUS
  Filled 2016-05-13: qty 2

## 2016-05-13 MED ORDER — MORPHINE SULFATE (PF) 4 MG/ML IV SOLN
4.0000 mg | Freq: Once | INTRAVENOUS | Status: AC
  Administered 2016-05-13: 4 mg via INTRAVENOUS
  Filled 2016-05-13: qty 1

## 2016-05-13 MED ORDER — LEVOTHYROXINE SODIUM 50 MCG PO TABS
150.0000 ug | ORAL_TABLET | Freq: Every day | ORAL | Status: DC
  Administered 2016-05-14 – 2016-05-16 (×3): 150 ug via ORAL
  Filled 2016-05-13 (×3): qty 1

## 2016-05-13 MED ORDER — TAMSULOSIN HCL 0.4 MG PO CAPS
0.4000 mg | ORAL_CAPSULE | Freq: Every day | ORAL | Status: DC
  Administered 2016-05-13 – 2016-05-16 (×4): 0.4 mg via ORAL
  Filled 2016-05-13 (×4): qty 1

## 2016-05-13 MED ORDER — ONDANSETRON HCL 4 MG PO TABS: 4.0000 mg | ORAL_TABLET | Freq: Four times a day (QID) | ORAL | Status: DC | PRN

## 2016-05-13 MED ORDER — SODIUM CHLORIDE 0.9 % IV BOLUS (SEPSIS)
1000.0000 mL | Freq: Once | INTRAVENOUS | Status: AC
  Administered 2016-05-13: 1000 mL via INTRAVENOUS

## 2016-05-13 MED ORDER — HEPARIN SODIUM (PORCINE) 5000 UNIT/ML IJ SOLN
5000.0000 [IU] | Freq: Three times a day (TID) | INTRAMUSCULAR | Status: DC
  Administered 2016-05-13 – 2016-05-16 (×8): 5000 [IU] via SUBCUTANEOUS
  Filled 2016-05-13 (×10): qty 1

## 2016-05-13 NOTE — Consult Note (Signed)
Patient seen and case reviewed.  Will follow with you as diagnostic and therapeutic laparotomy may be necessary.    Kaylyn Lim, MD, FACS

## 2016-05-13 NOTE — H&P (Signed)
TRH H&P   Patient Demographics:    Albert Richards, is a 75 y.o. male  MRN: [id number]   DOB - [birth year]  Admit Date - 05/13/2016  Outpatient Primary MD for the patient is Albert Noble, MD  Referring MD/NP/PA: Dr Albert Richards  Outpatient Specialists: Oncology Dr Albert Richards  Patient coming from: Home  Chief Complaint  Patient presents with  . Abdominal Pain  . Emesis      HPI:    Albert Richards  is a 75 y.o. male, With past medical history of stage IV metastatic pancreatic cancer, hypothyroidism, COPD, diabetes mellitus resents with complaints of abdominal pain, nausea and vomiting and diarrhea, patient reports she's been having intermittent abdominal pain for 4-5 weeks, with nausea, occasional vomiting, but never persistent, but he reports over the last 24 hours, he's been having significant abdominal pain, multiple episodes of yellow vomiting, and significant nausea, as well reports he had few episodes of diarrhea this a.m., he denies any coffee-ground emesis, any bright red blood per rectum, any chest pain, any shortness of breath, any fever or chills. - in ED CT abdomen pelvis was obtained, which showing multiple metastatic peritoneal implants, and mild small bowel obstruction with transition zone seen centrally in the abdomen (agent origin with known pulmonary and omental metastasis, but appears to be worsening), so hospitalist requested to admit for SBO    Review of systems:    In addition to the HPI above, No Fever-chills, No Headache, No changes with Vision or hearing, No problems swallowing food or Liquids, No Chest pain, Cough or Shortness of Breath, Complains of abdominal pain, nausea and vomiting, as well reports diarrhea developed this a.m. No Blood in stool or Urine, No dysuria, No new skin rashes or bruises, No new joints pains-aches,  No new weakness, tingling,  numbness in any extremity, No recent weight gain or loss, No polyuria, polydypsia or polyphagia, No significant Mental Stressors.  A full 10 point Review of Systems was done, except as stated above, all other Review of Systems were negative.   With Past History of the following :    Past Medical History:  Diagnosis Date  . Arthritis   . 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 Diabetes mellitus without complication ([place])   . History of kidney stones 10-12 yrs ago  . Hypothyroidism   . pancreatic ca dx'd 2014  . Pneumonia   . Rocky Mountain spotted fever       Past Surgical History:  Procedure Laterality Date  . AMPUTATION Left 01/08/2016   Procedure: AMPUTATION REVISION LEFT FIRST FINGER ;  Surgeon: Albert Crumb, MD;  Location: [facility];  Service: Orthopedics;  Laterality: Left;  . EUS N/A 07/08/2013   Procedure: UPPER ENDOSCOPIC ULTRASOUND (EUS) LINEAR;  Surgeon: Albert Banister, MD;  Location[facility];  Service: Endoscopy;  Laterality: N/A;  . I&D EXTREMITY Left 01/08/2016  Procedure: IRRIGATION AND DEBRIDEMENT LEFT THUMB;  Surgeon: Albert Crumb, MD;  Location: [facility];  Service: Orthopedics;  Laterality: Left;  . PORTACATH PLACEMENT N/A 07/30/2013   Procedure: INSERTION PORT-A-CATH;  Surgeon: Albert Klein, MD;  Location: [facility];  Service: General;  Laterality: N/A;  . TONSILLECTOMY  as child      Social History:     Social History  Substance Use Topics  . Smoking status: Former Smoker    Packs/day: 1.00    Years: 50.00    Types: Cigarettes    Start date: 07/30/1955    Quit date: 07/29/2005  . Smokeless tobacco: Never Used  . Alcohol use No     Lives - Home  Mobility - independent     Family History :   Significant for heart disease and father, mother died at age 89, no siblings, 2 children healthy    Home Medications:   Prior to Admission medications   Medication Sig Start Date End Date Taking? Authorizing Provider    gabapentin (NEURONTIN) 600 MG tablet Take 600 mg by mouth 2 (two) times daily. 01/10/16  Yes Historical Provider, MD  glipiZIDE (GLUCOTROL XL) 10 MG 24 hr tablet Take 10 mg by mouth daily with breakfast.  11/07/15  Yes Historical Provider, MD  HYDROcodone-acetaminophen (NORCO/VICODIN) 5-325 MG tablet Take 1-2 tablets every 4-6 hours as needed for pain Patient taking differently: Take 1-2 tablets by mouth every 4 (four) hours as needed for moderate pain or severe pain.  05/01/16  Yes Ladell Pier, MD  levothyroxine (SYNTHROID, LEVOTHROID) 150 MCG tablet Take 150 mcg by mouth every other day.    Yes Historical Provider, MD  levothyroxine (SYNTHROID, LEVOTHROID) 175 MCG tablet Take 175 mcg by mouth every other day. 12/26/15  Yes Historical Provider, MD  naproxen sodium (ANAPROX) 220 MG tablet Take 220 mg by mouth 2 (two) times daily as needed.   Yes Historical Provider, MD  nitroGLYCERIN (NITROSTAT) 0.4 MG SL tablet Place 0.4 mg under the tongue as needed for chest pain.  03/09/15  Yes Historical Provider, MD  omeprazole (PRILOSEC) 20 MG capsule Take 20 mg by mouth every morning.    Yes Historical Provider, MD  Tamsulosin HCl (FLOMAX) 0.4 MG CAPS Take 0.4 mg by mouth every morning.   Yes Historical Provider, MD     Allergies:   No known drug allergies  Physical Exam:   Vitals  Blood pressure 144/89, pulse 71, temperature 97.8 F (36.6 C), resp. rate 18, height 5\' 10"  (1.778 m), weight 97.2 kg (214 lb 6 oz), SpO2 95 %.   1. General Well-developed male lying in bed in NAD,    2. Normal affect and insight, Not Suicidal or Homicidal, Awake Alert, Oriented X 3.  3. No F.N deficits, ALL C.Nerves Intact, Strength 5/5 all 4 extremities, Sensation intact all 4 extremities, Plantars down going.  4. Ears and Eyes appear Normal, Conjunctivae clear, PERRLA. Moist Oral Mucosa.  5. Supple Neck, No JVD, No cervical lymphadenopathy appriciated, No Carotid Bruits.  6. Symmetrical Chest wall movement,  Good air movement bilaterally, CTAB.  7. RRR, No Gallops, Rubs or Murmurs, No Parasternal Heave.  8. Increased Bowel Sounds, Abdomen Soft, mild diffuse tenderness to palpation, No organomegaly appriciated,No rebound -guarding or rigidity.  9.  No Cyanosis, Normal Skin Turgor, No Skin Rash or Bruise.  10. Good muscle tone,  joints appear normal , no effusions, Normal ROM.      Data Review:    CBC  Recent Labs Lab  05/13/16 1137  WBC 13.1*  HGB 16.9  HCT 48.0  PLT 219  MCV 93.6  MCH 32.9  MCHC 35.2  RDW 13.6   ------------------------------------------------------------------------------------------------------------------  Chemistries   Recent Labs Lab 05/13/16 1137  NA 140  K 4.1  CL 105  CO2 26  GLUCOSE 155*  BUN 23*  CREATININE 1.21  CALCIUM 9.2  AST 22  ALT 21  ALKPHOS 123  BILITOT 0.9   ------------------------------------------------------------------------------------------------------------------ estimated creatinine clearance is 61.7 mL/min (by C-G formula based on SCr of 1.21 mg/dL). ------------------------------------------------------------------------------------------------------------------ No results for input(s): TSH, T4TOTAL, T3FREE, THYROIDAB in the last 72 hours.  Invalid input(s): FREET3  Coagulation profile No results for input(s): INR, PROTIME in the last 168 hours. ------------------------------------------------------------------------------------------------------------------- No results for input(s): DDIMER in the last 72 hours. -------------------------------------------------------------------------------------------------------------------  Cardiac Enzymes No results for input(s): CKMB, TROPONINI, MYOGLOBIN in the last 168 hours.  Invalid input(s): CK ------------------------------------------------------------------------------------------------------------------ No results found for:  BNP   ---------------------------------------------------------------------------------------------------------------  Urinalysis    Component Value Date/Time   COLORURINE AMBER (A) 05/13/2016 [place] 05/13/2016 1132   LABSPEC 1.027 05/13/2016 1132   PHURINE 8.0 05/13/2016 1132   GLUCOSEU NEGATIVE 05/13/2016 1132   HGBUR NEGATIVE 05/13/2016 1132   BILIRUBINUR SMALL (A) 05/13/2016 1132   KETONESUR NEGATIVE 05/13/2016 1132   PROTEINUR NEGATIVE 05/13/2016 1132   NITRITE NEGATIVE 05/13/2016 1132   LEUKOCYTESUR TRACE (A) 05/13/2016 1132    ----------------------------------------------------------------------------------------------------------------   Imaging Results:    Ct Abdomen Pelvis W Contrast  Result Date: 05/13/2016 CLINICAL DATA:  Lower abdominal pain. Current history of pancreatic cancer. EXAM: CT ABDOMEN AND PELVIS WITH CONTRAST TECHNIQUE: Multidetector CT imaging of the abdomen and pelvis was performed using the standard protocol following bolus administration of intravenous contrast. CONTRAST:  166mL ISOVUE-300 IOPAMIDOL (ISOVUE-300) INJECTION 61% COMPARISON:  CT scan of April 22, 2016. FINDINGS: Lower chest: At least 2 nodules are noted in the right lung base concerning for pulmonary metastases. Hepatobiliary: No gallstones are noted.  Normal liver. Pancreas: Stable pancreatic atrophy. Spleen: No abnormality seen. Adrenals/Urinary Tract: Adrenal glands appear normal. Stable bilateral renal cysts. No hydronephrosis or renal obstruction is noted. No renal or ureteral calculi are noted. Urinary bladder appears normal. Stomach/Bowel: Mild small bowel obstruction is noted with transition zone seen in central portion of abdomen best seen on image number 50 of series 2. 2 cm soft tissue abnormality is noted along the small bowel in this area that appears to be called on the extraction, consistent with peritoneal metastatic implant. No colonic dilatation is noted  probable 1.7 cm peritoneal implants seen at the junction of the sigmoid colon and rectum. Several other implants are seen along the small bowel. Several nodular densities are seen in the right side of the omentum anteriorly concerning for peritoneal implants. Vascular/Lymphatic: Atherosclerosis of abdominal aorta is noted without aneurysm formation. No significant adenopathy is noted. Reproductive: Stable mild prostatic enlargement is noted. Other: No abnormal fluid collection is noted. Musculoskeletal: Multilevel degenerative disc disease is noted in the lower lumbar spine. 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 Stable pancreatic atrophy. 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 Mild small bowel obstruction is noted, with transition zone seen centrally in the abdomen, due to peritoneal implant on the small bowel in this area. Electronically Signed   By: Marijo Conception, M.D.   On: 05/13/2016 14:56  Assessment & Plan:    Active Problems:   Stage IV adenocarcinoma of pancreas (HCC)   COPD (chronic obstructive pulmonary disease) (HCC)   SBO (small bowel obstruction)   Diabetes mellitus (HCC)   Hypothyroid    Abdominal pain/nausea/vomiting secondary to his SBO - Patient presents with abdominal pain progressing over the last 5 weeks, as well nausea, and bilious vomiting over last 24 hours, CT abdomen pelvis significant for mild is by mouth secondary to metastasis. - Continue with IV fluids, keep nothing by mouth, continue with when necessary pain and nausea medication - Surgery has been consulted by ED  Pancreatic cancer stage IV - Followed by oncology Dr. Benay Spice, who will see in a.m.  Hypothyroidism - Continue with Synthroid  Diabetes mellitus - Hold oral hypoglycemic agent and start an insulin sliding scale every 4 hours  COPD - No  wheezing, stable   DVT Prophylaxis Heparin -  Lovenox - SCDs   AM Labs Ordered, also please review Full Orders  Family Communication: Admission, patients condition and plan of care including tests being ordered have been discussed with the patient and wife who indicate understanding and agree with the plan and Code Status.  Code Status full  Likely DC to Home  Condition GUARDED    Consults called: Surgery called by ED, discussed with Dr. Benay Spice  Admission status: obs  Time spent in minutes : 55 minutes   ELGERGAWY, DAWOOD M.D on 05/13/2016 at 3:40 PM  Between 7am to 7pm - Pager - [phone]. After 7pm go to [email or web address] - password [facility]  [facility] - Office  [phone]

## 2016-05-13 NOTE — Telephone Encounter (Signed)
Pt on the way to [facility] with Abdominal pain , watery diarrhea, vomiting- Friday night -he was fine. Sat- he had acute onset ' terrible sharp pain lower abdomen , radiating to back"  > Sunday started  vomiting 6a,8a, 10a with continued pain . Today has watery, foamy diarrhea and persistent abd pain , denies fever.  Reports pt had similar "spell two weeks ago, except no diarrhea and pain went away."

## 2016-05-13 NOTE — Plan of Care (Signed)
Problem: Fluid Volume: Goal: Ability to maintain a balanced intake and output will improve Outcome: Not Progressing Patient is NPO    Problem: Nutrition: Goal: Adequate nutrition will be maintained Outcome: Not Progressing Patient NPO

## 2016-05-13 NOTE — ED Provider Notes (Signed)
Consult call placed to surgery by previous provider, patient here with small bowel obstruction and peritoneal metastatic disease. Surgical team consult and who would consult patient on hospital floor. Patient was not in the ED at the time of consultation I did not personally evaluate the patient. Please see previous provider's note for full H&P.   Okey Regal, PA-C 05/13/16 1640    Sherwood Gambler, MD 05/17/16 [phone]

## 2016-05-13 NOTE — ED Provider Notes (Signed)
[place] DEPT Provider Note   CSN: [id number] Arrival date & time: 05/13/16  1117     History   Chief Complaint Chief Complaint  Patient presents with  . Abdominal Pain  . Emesis    HPI Albert Richards is a 75 y.o. male.  HPI Patient presents to the emergency department with abdominal pain has been ongoing over the last 5 weeks.  She states that it was intermittent and would happen maybe once a week until Saturday night.  He said having constant abdominal discomfort with nausea, vomiting, diarrhea.  Patient states he spoke with his oncologist, who advised him to come to the emergency department.  The patient states that nothing seems to make the condition better or worseThe patient denies chest pain, shortness of breath, headache,blurred vision, neck pain, fever, cough, weakness, numbness, dizziness, anorexia, edema,  rash, back pain, dysuria, hematemesis, bloody stool, near syncope, or syncope. Past Medical History:  Diagnosis Date  . Arthritis   . 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 Diabetes mellitus without complication ([place])   . History of kidney stones 10-12 yrs ago  . Hypothyroidism   . pancreatic ca dx'd 2014  . Pneumonia   . Rocky Mountain spotted fever     Patient Active Problem List   Diagnosis Date Noted  . Atherosclerosis of aorta ([place]) 07/07/2013  . Stage IV adenocarcinoma of pancreas ([place]) 06/30/2013  . COPD (chronic obstructive pulmonary disease) ([place]) 06/30/2013    Past Surgical History:  Procedure Laterality Date  . AMPUTATION Left 01/08/2016   Procedure: AMPUTATION REVISION LEFT FIRST FINGER ;  Surgeon: Charlotte Crumb, MD;  Location: [facility];  Service: Orthopedics;  Laterality: Left;  . EUS N/A 07/08/2013   Procedure: UPPER ENDOSCOPIC ULTRASOUND (EUS) LINEAR;  Surgeon: Milus Banister, MD;  [facility];  Service: Endoscopy;  Laterality: N/A;  . I&D EXTREMITY Left 01/08/2016   Procedure: IRRIGATION AND  DEBRIDEMENT LEFT THUMB;  Surgeon: Charlotte Crumb, MD;  Location: [facility];  Service: Orthopedics;  Laterality: Left;  . PORTACATH PLACEMENT N/A 07/30/2013   Procedure: INSERTION PORT-A-CATH;  Surgeon: Stark Klein, MD;  Location: [facility];  Service: General;  Laterality: N/A;  . TONSILLECTOMY  as child       Home Medications    Prior to Admission medications   Medication Sig Start Date End Date Taking? Authorizing Provider  gabapentin (NEURONTIN) 600 MG tablet Take 600 mg by mouth 2 (two) times daily. 01/10/16  Yes Historical Provider, MD  glipiZIDE (GLUCOTROL XL) 10 MG 24 hr tablet Take 10 mg by mouth daily with breakfast.  11/07/15  Yes Historical Provider, MD  HYDROcodone-acetaminophen (NORCO/VICODIN) 5-325 MG tablet Take 1-2 tablets every 4-6 hours as needed for pain Patient taking differently: Take 1-2 tablets by mouth every 4 (four) hours as needed for moderate pain or severe pain.  05/01/16  Yes Ladell Pier, MD  levothyroxine (SYNTHROID, LEVOTHROID) 150 MCG tablet Take 150 mcg by mouth every other day.    Yes Historical Provider, MD  levothyroxine (SYNTHROID, LEVOTHROID) 175 MCG tablet Take 175 mcg by mouth every other day. 12/26/15  Yes Historical Provider, MD  naproxen sodium (ANAPROX) 220 MG tablet Take 220 mg by mouth 2 (two) times daily as needed.   Yes Historical Provider, MD  nitroGLYCERIN (NITROSTAT) 0.4 MG SL tablet Place 0.4 mg under the tongue as needed for chest pain.  03/09/15  Yes Historical Provider, MD  omeprazole (PRILOSEC) 20 MG capsule Take 20 mg  by mouth every morning.    Yes Historical Provider, MD  Tamsulosin HCl (FLOMAX) 0.4 MG CAPS Take 0.4 mg by mouth every morning.   Yes Historical Provider, MD    Family History No family history on file.  Social History Social History  Substance Use Topics  . Smoking status: Former Smoker    Packs/day: 1.00    Years: 50.00    Types: Cigarettes    Start date: 07/30/1955    Quit date: 07/29/2005  . Smokeless tobacco: Never  Used  . Alcohol use No     Allergies   Review of patient's allergies indicates no known allergies.   Review of Systems Review of Systems All other systems negative except as documented in the HPI. All pertinent positives and negatives as reviewed in the HPI.  Physical Exam Updated Vital Signs BP 144/89   Pulse 71   Temp 97.8 F (36.6 C)   Resp 18   Ht 5\' 10"  (1.778 m)   Wt 97.2 kg   SpO2 95%   BMI 30.76 kg/m   Physical Exam  Constitutional: He is oriented to person, place, and time. He appears well-developed and well-nourished. No distress.  HENT:  Head: Normocephalic and atraumatic.  Mouth/Throat: Oropharynx is clear and moist.  Eyes: Pupils are equal, round, and reactive to light.  Neck: Normal range of motion. Neck supple.  Cardiovascular: Normal rate, regular rhythm and normal heart sounds.  Exam reveals no gallop and no friction rub.   No murmur heard. Pulmonary/Chest: Effort normal and breath sounds normal. No respiratory distress. He has no wheezes.  Abdominal: Soft. Bowel sounds are normal. He exhibits no distension and no mass. There is tenderness. There is no guarding.  Neurological: He is alert and oriented to person, place, and time. He exhibits normal muscle tone. Coordination normal.  Skin: Skin is warm and dry. No rash noted. No erythema.  Psychiatric: He has a normal mood and affect. His behavior is normal.  Nursing note and vitals reviewed.    ED Treatments / Results  Labs (all labs ordered are listed, but only abnormal results are displayed) Labs Reviewed  COMPREHENSIVE METABOLIC PANEL - Abnormal; Notable for the following:       Result Value   Glucose, Bld 155 (*)    BUN 23 (*)    GFR calc non Af Amer 57 (*)    All other components within normal limits  CBC - Abnormal; Notable for the following:    WBC 13.1 (*)    All other components within normal limits  URINALYSIS, ROUTINE W REFLEX MICROSCOPIC (NOT AT [facility]) - Abnormal; Notable for the  following:    Color, Urine AMBER (*)    Bilirubin Urine SMALL (*)    Leukocytes, UA TRACE (*)    All other components within normal limits  URINE MICROSCOPIC-ADD ON - Abnormal; Notable for the following:    Squamous Epithelial / LPF 0-5 (*)    Bacteria, UA MANY (*)    All other components within normal limits  LIPASE, BLOOD    EKG  EKG Interpretation None       Radiology Ct Abdomen Pelvis W Contrast  Result Date: 05/13/2016 CLINICAL DATA:  Lower abdominal pain. Current history of pancreatic cancer. EXAM: CT ABDOMEN AND PELVIS WITH CONTRAST TECHNIQUE: Multidetector CT imaging of the abdomen and pelvis was performed using the standard protocol following bolus administration of intravenous contrast. CONTRAST:  153mL ISOVUE-300 IOPAMIDOL (ISOVUE-300) INJECTION 61% COMPARISON:  CT scan of April 22, 2016. FINDINGS: Lower chest: At least 2 nodules are noted in the right lung base concerning for pulmonary metastases. Hepatobiliary: No gallstones are noted.  Normal liver. Pancreas: Stable pancreatic atrophy. Spleen: No abnormality seen. Adrenals/Urinary Tract: Adrenal glands appear normal. Stable bilateral renal cysts. No hydronephrosis or renal obstruction is noted. No renal or ureteral calculi are noted. Urinary bladder appears normal. Stomach/Bowel: Mild small bowel obstruction is noted with transition zone seen in central portion of abdomen best seen on image number 50 of series 2. 2 cm soft tissue abnormality is noted along the small bowel in this area that appears to be called on the extraction, consistent with peritoneal metastatic implant. No colonic dilatation is noted probable 1.7 cm peritoneal implants seen at the junction of the sigmoid colon and rectum. Several other implants are seen along the small bowel. Several nodular densities are seen in the right side of the omentum anteriorly concerning for peritoneal implants. Vascular/Lymphatic: Atherosclerosis of abdominal aorta is noted  without aneurysm formation. No significant adenopathy is noted. Reproductive: Stable mild prostatic enlargement is noted. Other: No abnormal fluid collection is noted. Musculoskeletal: Multilevel degenerative disc disease is noted in the lower lumbar spine. 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 Stable pancreatic atrophy. 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 Mild small bowel obstruction is noted, with transition zone seen centrally in the abdomen, due to peritoneal implant on the small bowel in this area. Electronically Signed   By: Marijo Conception, M.D.   On: 05/13/2016 14:56    Procedures Procedures (including critical care time)  Medications Ordered in ED Medications  morphine 4 MG/ML injection 4 mg (4 mg Intravenous Given 05/13/16 1409)  ondansetron (ZOFRAN) injection 4 mg (4 mg Intravenous Given 05/13/16 1407)  iopamidol (ISOVUE-300) 61 % injection 100 mL (100 mLs Intravenous Contrast Given 05/13/16 1427)  sodium chloride 0.9 % bolus 1,000 mL (1,000 mLs Intravenous New Bag/Given 05/13/16 1514)     Initial Impression / Assessment and Plan / ED Course  I have reviewed the triage vital signs and the nursing notes.  Pertinent labs & imaging results that were available during my care of the patient were reviewed by me and considered in my medical decision making (see chart for details).  Clinical Course    I spoke with the [facility] hospitalist, who will admit the patient.  They had requested that a speak to general surgery.  Patient is given the plan and all questions were answered  Final Clinical Impressions(s) / ED Diagnoses   Final diagnoses:  None    New Prescriptions New Prescriptions   No medications on file     Dalia Heading, PA-C 05/13/16 [place], MD 05/15/16 1223

## 2016-05-13 NOTE — ED Triage Notes (Signed)
Pt reports abd pain and emesis once every week for  5 weeks per family. Was seen and CT done for similar symptoms. Projectile vomiting x 1 day and diarrhea started today. sts on and off lower abd radiating to back. Hx kidney stones yet sts doesn't feel like kidney stone pain. denies urinary symptoms. Alert and oriented x 4.

## 2016-05-13 NOTE — Telephone Encounter (Signed)
1130-DrBenay Spice notified that patient is on the way to [facility] ER.

## 2016-05-13 NOTE — ED Notes (Signed)
Urine sample in triage

## 2016-05-14 DIAGNOSIS — R112 Nausea with vomiting, unspecified: Secondary | ICD-10-CM | POA: Diagnosis not present

## 2016-05-14 DIAGNOSIS — Z923 Personal history of irradiation: Secondary | ICD-10-CM | POA: Diagnosis not present

## 2016-05-14 DIAGNOSIS — K56609 Unspecified intestinal obstruction, unspecified as to partial versus complete obstruction: Secondary | ICD-10-CM

## 2016-05-14 DIAGNOSIS — C786 Secondary malignant neoplasm of retroperitoneum and peritoneum: Secondary | ICD-10-CM | POA: Diagnosis not present

## 2016-05-14 DIAGNOSIS — C259 Malignant neoplasm of pancreas, unspecified: Secondary | ICD-10-CM | POA: Diagnosis present

## 2016-05-14 DIAGNOSIS — E039 Hypothyroidism, unspecified: Secondary | ICD-10-CM

## 2016-05-14 DIAGNOSIS — Z7984 Long term (current) use of oral hypoglycemic drugs: Secondary | ICD-10-CM | POA: Diagnosis not present

## 2016-05-14 DIAGNOSIS — Z87442 Personal history of urinary calculi: Secondary | ICD-10-CM | POA: Diagnosis not present

## 2016-05-14 DIAGNOSIS — R197 Diarrhea, unspecified: Secondary | ICD-10-CM | POA: Diagnosis not present

## 2016-05-14 DIAGNOSIS — C252 Malignant neoplasm of tail of pancreas: Secondary | ICD-10-CM

## 2016-05-14 DIAGNOSIS — E119 Type 2 diabetes mellitus without complications: Secondary | ICD-10-CM | POA: Diagnosis present

## 2016-05-14 DIAGNOSIS — Z79899 Other long term (current) drug therapy: Secondary | ICD-10-CM | POA: Diagnosis not present

## 2016-05-14 DIAGNOSIS — Z87891 Personal history of nicotine dependence: Secondary | ICD-10-CM | POA: Diagnosis not present

## 2016-05-14 DIAGNOSIS — J449 Chronic obstructive pulmonary disease, unspecified: Secondary | ICD-10-CM | POA: Diagnosis present

## 2016-05-14 DIAGNOSIS — R109 Unspecified abdominal pain: Secondary | ICD-10-CM | POA: Diagnosis present

## 2016-05-14 LAB — BASIC METABOLIC PANEL
ANION GAP: 7 (ref 5–15)
BUN: 25 mg/dL — ABNORMAL HIGH (ref 6–20)
CALCIUM: 8.3 mg/dL — AB (ref 8.9–10.3)
CO2: 24 mmol/L (ref 22–32)
Chloride: 109 mmol/L (ref 101–111)
Creatinine, Ser: 1.15 mg/dL (ref 0.61–1.24)
GLUCOSE: 130 mg/dL — AB (ref 65–99)
POTASSIUM: 4.2 mmol/L (ref 3.5–5.1)
Sodium: 140 mmol/L (ref 135–145)

## 2016-05-14 LAB — CBC
HEMATOCRIT: 45.9 % (ref 39.0–52.0)
Hemoglobin: 15.1 g/dL (ref 13.0–17.0)
MCH: 32.6 pg (ref 26.0–34.0)
MCHC: 32.9 g/dL (ref 30.0–36.0)
MCV: 99.1 fL (ref 78.0–100.0)
Platelets: 188 10*3/uL (ref 150–400)
RBC: 4.63 MIL/uL (ref 4.22–5.81)
RDW: 14.1 % (ref 11.5–15.5)
WBC: 9.2 10*3/uL (ref 4.0–10.5)

## 2016-05-14 LAB — GLUCOSE, CAPILLARY
Glucose-Capillary: 131 mg/dL — ABNORMAL HIGH (ref 65–99)
Glucose-Capillary: 125 mg/dL — ABNORMAL HIGH (ref 65–99)
Glucose-Capillary: 93 mg/dL (ref 65–99)
Glucose-Capillary: 143 mg/dL — ABNORMAL HIGH (ref 65–99)
Glucose-Capillary: 106 mg/dL — ABNORMAL HIGH (ref 65–99)

## 2016-05-14 NOTE — Telephone Encounter (Signed)
Pt currently admitted.

## 2016-05-14 NOTE — Progress Notes (Signed)
IP PROGRESS NOTE  Subjective:   Albert Richards was admitted yesterday with abdominal pain and vomiting. He reports the onset of severe abdominal pain 05/11/2016. He vomited. He presents emergency room for evaluation on 05/13/2016 and a CT revealed evidence of a small bowel obstruction. He reports diarrhea since admission to the hospital. The pain has improved.  Objective: Vital signs in last 24 hours: Blood pressure (!) 148/67, pulse 64, temperature 98 F (36.7 C), temperature source Oral, resp. rate 16, height 5\' 10"  (1.778 m), weight 214 lb 15.2 oz (97.5 kg), SpO2 96 %.  Intake/Output from previous day: 10/16 0701 - 10/17 0700 In: 800 [I.V.:800] Out: -   Physical Exam:  Lungs: Distant breath sounds, no respiratory distress Cardiac: Regular rate and rhythm Abdomen: Soft and nontender, no mass Extremities: No leg edema   Portacath/PICC-without erythema  Lab Results:  Recent Labs  05/13/16 1137 05/14/16 0500  WBC 13.1* 9.2  HGB 16.9 15.1  HCT 48.0 45.9  PLT 219 188    BMET  Recent Labs  05/13/16 1137 05/14/16 0500  NA 140 140  K 4.1 4.2  CL 105 109  CO2 26 24  GLUCOSE 155* 130*  BUN 23* 25*  CREATININE 1.21 1.15  CALCIUM 9.2 8.3*    Studies/Results: Ct Abdomen Pelvis W Contrast  Result Date: 05/13/2016 CLINICAL DATA:  Lower abdominal pain. Current history of pancreatic cancer. EXAM: CT ABDOMEN AND PELVIS WITH CONTRAST TECHNIQUE: Multidetector CT imaging of the abdomen and pelvis was performed using the standard protocol following bolus administration of intravenous contrast. CONTRAST:  146mL ISOVUE-300 IOPAMIDOL (ISOVUE-300) INJECTION 61% COMPARISON:  CT scan of April 22, 2016. FINDINGS: Lower chest: At least 2 nodules are noted in the right lung base concerning for pulmonary metastases. Hepatobiliary: No gallstones are noted.  Normal liver. Pancreas: Stable pancreatic atrophy. Spleen: No abnormality seen. Adrenals/Urinary Tract: Adrenal glands appear  normal. Stable bilateral renal cysts. No hydronephrosis or renal obstruction is noted. No renal or ureteral calculi are noted. Urinary bladder appears normal. Stomach/Bowel: Mild small bowel obstruction is noted with transition zone seen in central portion of abdomen best seen on image number 50 of series 2. 2 cm soft tissue abnormality is noted along the small bowel in this area that appears to be called on the extraction, consistent with peritoneal metastatic implant. No colonic dilatation is noted probable 1.7 cm peritoneal implants seen at the junction of the sigmoid colon and rectum. Several other implants are seen along the small bowel. Several nodular densities are seen in the right side of the omentum anteriorly concerning for peritoneal implants. Vascular/Lymphatic: Atherosclerosis of abdominal aorta is noted without aneurysm formation. No significant adenopathy is noted. Reproductive: Stable mild prostatic enlargement is noted. Other: No abnormal fluid collection is noted. Musculoskeletal: Multilevel degenerative disc disease is noted in the lower lumbar spine. 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 Stable pancreatic atrophy. 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 Mild small bowel obstruction is noted, with transition zone seen centrally in the abdomen, due to peritoneal implant on the small bowel in this area. Electronically Signed   By: Marijo Conception, M.D.   On: 05/13/2016 14:56    Medications: I have reviewed the patient's current medications.  Assessment/Plan:  1. Stage IV pancreas cancer ? pancreas tail mass confirmed on EUS 07/08/2013, biopsy revealed adenocarcinoma ? PET scan 07/20/2013-malignant range uptake in the omentum,  hypermetabolic mass in the pancreas tail, multiple pulmonary nodules without associated increased FDG  activity ? FOLFIRINOX 08/10/2013-02/01/2014 with a clinical response  ? gemcitabine/Abraxane 06/29/2014-12/13/2014 with a decreased CA 19-9 and response in the lungs and peritoneal cavity, no visible tumor in the pancreas ? FOLFIRINOX resumed 03/07/2015-07/26/2015 ? CT 12/29/2015 showed enlargement of 2 lung nodules, CA 19-9 rising ? Rising CA 19-9, CTs 04/22/2016, enlargement of multiple lung nodules, no pancreas mass, mild increase in the size of 2 omental nodules-no new nodules  2. Diabetes-glipizide placed on hold 03/11/2016  3. Hypothyroidism  4. 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 History of kidney stones  6.   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 I discussed the CT findings with Albert Richards and his family. He has been seen in consultation by Dr. Hassell Done. I explained that sometimes malignant bowel obstructions will improve with bowel rest. At other times surgical intervention needs to be considered.  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 He has been maintained off of specific therapy since December 2016. Chemotherapy will most likely not relieve the bowel obstruction. He is a candidate for repeat treatment with gemcitabine/Abraxane or FOLFIRINOX. If he requires surgery we would like to obtain diagnostic tissue for Foundation 1 testing.  Recommendations: 1. Management of the small bowel obstruction per internal medicine and surgery 2. I will continue following him in the hospital and we will arrange for outpatient follow-up.   LOS: 0 days   Albert Coder, MD   05/14/2016, 2:58 PM

## 2016-05-14 NOTE — Progress Notes (Signed)
Patient refused 1 unit Insulin. States that his doctor informed him not to treat his blood sugar unless greater than 200. Patient educated.   Wylene Simmer, BSN, RN 05/14/2016 9:34 AM

## 2016-05-14 NOTE — Progress Notes (Signed)
PROGRESS NOTE    MIKELE WHORTON  [id number] DOB: [birth year] DOA: 05/13/2016 PCP: Carylon Perches, MD    Brief Narrative:  Laiden Kaniecki  is a 75 y.o. male, With past medical history of stage IV metastatic pancreatic cancer, hypothyroidism, COPD, diabetes mellitus resents with complaints of abdominal pain, nausea and vomiting and diarrhea, he was found to have sbo. He was admitted for further eval.  Surgery and oncology consulted.   Assessment & Plan:   Active Problems:   Stage IV adenocarcinoma of pancreas (HCC)   COPD (chronic obstructive pulmonary disease) (HCC)   SBO (small bowel obstruction)   Diabetes mellitus (HCC)   Hypothyroid  Abdominal pain/ associated with nausea, vomiting sec to SBO: - resolved.  - able to pass flatulence, multiple liquid stools.  - surgery consulted and recommendations given.    Stage 4 Pancreatic Cancer: - further management as per oncology.   Hypothyroidism: Resume with synthroid.    Diabetes Mellitus: CBG (last 3)   Recent Labs  05/14/16 0735 05/14/16 1144 05/14/16 1600  GLUCAP 125* 93 143*    Resume SSI.   Copd; Stable.      DVT prophylaxis: (Lovenox) Code Status: (Full/ Family Communication: family at bedside Disposition Plan: pending further eval   Consultants:   Surgery  oncology   Procedures: none  Antimicrobials: none  Subjective: Feeling better, flatulence present , two liquid BM  Objective: Vitals:   05/13/16 1601 05/13/16 1621 05/13/16 2031 05/14/16 0428  BP: 101/86 (!) 159/96 122/65 125/80  Pulse: (!) 59 62 68 64  Resp: 16 16 16 16   Temp:  98 F (36.7 C) 98.2 F (36.8 C) 97.7 F (36.5 C)  TempSrc:  Oral Oral Oral  SpO2: 93% 95% 91% 93%  Weight:  97.5 kg (214 lb 15.2 oz)    Height:  5\' 10"  (1.778 m)      Intake/Output Summary (Last 24 hours) at 05/14/16 1343 Last data filed at 05/14/16 0317  Gross per 24 hour  Intake              800 ml  Output                0 ml  Net               800 ml   Filed Weights   05/13/16 1122 05/13/16 1621  Weight: 97.2 kg (214 lb 6 oz) 97.5 kg (214 lb 15.2 oz)    Examination:  General exam: Appears calm and comfortable  Respiratory system: Clear to auscultation. Respiratory effort normal. Cardiovascular system: S1 & S2 heard, RRR. No JVD, murmurs, rubs, gallops or clicks. No pedal edema. Gastrointestinal system: Abdomen is soft mild tenderness,, increased bowel sounds. Central nervous system: Alert and oriented. No focal neurological deficits. Extremities: Symmetric 5 x 5 power. Skin: No rashes, lesions or ulcers Psychiatry: Judgement and insight appear normal. Mood & affect appropriate.     Data Reviewed: I have personally reviewed following labs and imaging studies  CBC:  Recent Labs Lab 05/13/16 1137 05/14/16 0500  WBC 13.1* 9.2  HGB 16.9 15.1  HCT 48.0 45.9  MCV 93.6 99.1  PLT 219 188   Basic Metabolic Panel:  Recent Labs Lab 05/13/16 1137 05/14/16 0500  NA 140 140  K 4.1 4.2  CL 105 109  CO2 26 24  GLUCOSE 155* 130*  BUN 23* 25*  CREATININE 1.21 1.15  CALCIUM 9.2 8.3*   GFR: Estimated Creatinine Clearance: 65 mL/min (by C-G formula based on  SCr of 1.15 mg/dL). Liver Function Tests:  Recent Labs Lab 05/13/16 1137  AST 22  ALT 21  ALKPHOS 123  BILITOT 0.9  PROT 7.6  ALBUMIN 4.3    Recent Labs Lab 05/13/16 1137  LIPASE 12   No results for input(s): AMMONIA in the last 168 hours. Coagulation Profile: No results for input(s): INR, PROTIME in the last 168 hours. Cardiac Enzymes: No results for input(s): CKTOTAL, CKMB, CKMBINDEX, TROPONINI in the last 168 hours. BNP (last 3 results) No results for input(s): PROBNP in the last 8760 hours. HbA1C: No results for input(s): HGBA1C in the last 72 hours. CBG:  Recent Labs Lab 05/13/16 2002 05/14/16 0002 05/14/16 0403 05/14/16 0735 05/14/16 1144  GLUCAP 113* 123* 131* 125* 93   Lipid Profile: No results for input(s): CHOL, HDL,  LDLCALC, TRIG, CHOLHDL, LDLDIRECT in the last 72 hours. Thyroid Function Tests: No results for input(s): TSH, T4TOTAL, FREET4, T3FREE, THYROIDAB in the last 72 hours. Anemia Panel: No results for input(s): VITAMINB12, FOLATE, FERRITIN, TIBC, IRON, RETICCTPCT in the last 72 hours. Sepsis Labs: No results for input(s): PROCALCITON, LATICACIDVEN in the last 168 hours.  No results found for this or any previous visit (from the past 240 hour(s)).       Radiology Studies: Ct Abdomen Pelvis W Contrast  Result Date: 05/13/2016 CLINICAL DATA:  Lower abdominal pain. Current history of pancreatic cancer. EXAM: CT ABDOMEN AND PELVIS WITH CONTRAST TECHNIQUE: Multidetector CT imaging of the abdomen and pelvis was performed using the standard protocol following bolus administration of intravenous contrast. CONTRAST:  ISOVUE-300 IOPAMIDOL (ISOVUE-300) INJECTION 61% COMPARISON:  CT scan of April 22, 2016. FINDINGS: Lower chest: At least 2 nodules are noted in the right lung base concerning for pulmonary metastases. Hepatobiliary: No gallstones are noted.  Normal liver. Pancreas: Stable pancreatic atrophy. Spleen: No abnormality seen. Adrenals/Urinary Tract: Adrenal glands appear normal. Stable bilateral renal cysts. No hydronephrosis or renal obstruction is noted. No renal or ureteral calculi are noted. Urinary bladder appears normal. Stomach/Bowel: Mild small bowel obstruction is noted with transition zone seen in central portion of abdomen best seen on image number 50 of series 2. 2 cm soft tissue abnormality is noted along the small bowel in this area that appears to be called on the extraction, consistent with peritoneal metastatic implant. No colonic dilatation is noted probable 1.7 cm peritoneal implants seen at the junction of the sigmoid colon and rectum. Several other implants are seen along the small bowel. Several nodular densities are seen in the right side of the omentum anteriorly concerning  for peritoneal implants. Vascular/Lymphatic: Atherosclerosis of abdominal aorta is noted without aneurysm formation. No significant adenopathy is noted. Reproductive: Stable mild prostatic enlargement is noted. Other: No abnormal fluid collection is noted. Musculoskeletal: Multilevel degenerative disc disease is noted in the lower lumbar spine. 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 Stable pancreatic atrophy. 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 Mild small bowel obstruction is noted, with transition zone seen centrally in the abdomen, due to peritoneal implant on the small bowel in this area. Electronically Signed   By: Lupita Raider, M.D.   On: 05/13/2016 14:56        Scheduled Meds: . heparin  5,000 Units Subcutaneous Q8H  . insulin aspart  0-9 Units Subcutaneous Q4H  . levothyroxine  150 mcg Oral QAC breakfast  . tamsulosin  0.4  mg Oral Daily   Continuous Infusions: . sodium chloride 75 mL/hr at 05/14/16 0047     LOS: 0 days    Time spent: 30 minutes    Saidah Kempton, MD [facility] Pager [phone]  If 7PM-7AM, please contact night-coverage [email or web address] Password [email or web address] 05/14/2016, 1:43 PM

## 2016-05-15 LAB — GLUCOSE, CAPILLARY
GLUCOSE-CAPILLARY: 116 mg/dL — AB (ref 65–99)
GLUCOSE-CAPILLARY: 155 mg/dL — AB (ref 65–99)
GLUCOSE-CAPILLARY: 182 mg/dL — AB (ref 65–99)
GLUCOSE-CAPILLARY: 88 mg/dL (ref 65–99)
Glucose-Capillary: 143 mg/dL — ABNORMAL HIGH (ref 65–99)
Glucose-Capillary: 111 mg/dL — ABNORMAL HIGH (ref 65–99)

## 2016-05-15 MED ORDER — PANTOPRAZOLE SODIUM 40 MG PO TBEC
40.0000 mg | DELAYED_RELEASE_TABLET | Freq: Two times a day (BID) | ORAL | Status: DC
  Administered 2016-05-15 – 2016-05-16 (×2): 40 mg via ORAL
  Filled 2016-05-15 (×3): qty 1

## 2016-05-15 NOTE — Care Management Note (Signed)
Case Management Note  Patient Details  Name: MOHD. WIRTS MRN: [id number] Date of Birth: [birth year]  Subjective/Objective:    75 yo admitted with SBO.                Action/Plan: From home with spouse. Chart reviewed and CM following for DC needs.  Expected Discharge Date:   (unknown)               Expected Discharge Plan:  Home/Self Care  In-House Referral:     Discharge planning Services  CM Consult  Post Acute Care Choice:    Choice offered to:     DME Arranged:    DME Agency:     HH Arranged:    HH Agency:     Status of Service:  In process, will continue to follow  If discussed at Long Length of Stay Meetings, dates discussed:    Additional CommentsLynnell Catalan, RN 05/15/2016, 1:43 PM  [phone]

## 2016-05-15 NOTE — Progress Notes (Signed)
PROGRESS NOTE  Albert Richards  [name] DOB: 07-25-1941 DOA: 05/13/2016 PCP: Albert Noble, MD   Brief Narrative:   Albert Richards  is a 75 y.o. male with a history of stage IV metastatic pancreatic cancer, hypothyroidism, COPD, and DM who presented with complaints of abdominal pain, nausea and vomiting and diarrhea. He was found to have a SBO related to metastatic disease.   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  - Advance to full liquids today - Surgery following   Stage IV pancreatic cancer: - Per oncology, Dr. Benay Richards   Hypothyroidism: TSH 0.452 in July 2017. - Resume synthroid.   Diabetes mellitus: At inpatient goal on SSI - Continue SSI  COPD: Chronic, stable, without exacerbation - Monitor  DVT prophylaxis: Albert Richards Code Status: Full Family Communication: Discussed with wife at bedside Disposition Plan: Discharge to home once SBO resolves and tolerates full diet.  Consultants:   Surgery  Oncology  Procedures: none  Antimicrobials: none  Subjective: Overall feeling better. Clear liquids this morning went well. +flatus.   Objective: Vitals:   05/14/16 0428 05/14/16 1305 05/14/16 2109 05/15/16 0454  BP: 125/80 (!) 148/67 124/70 (!) 145/90  Pulse: 64 64 (!) 56 (!) 52  Resp: 16 16 18 18   Temp: 97.7 F (36.5 C) 98 F (36.7 C) 97.8 F (36.6 C) 97.8 F (36.6 C)  TempSrc: Oral Oral Oral Oral  SpO2: 93% 96% 96% 94%  Weight:      Height:        Intake/Output Summary (Last 24 hours) at 05/15/16 1443 Last data filed at 05/15/16 [id number]  Gross per 24 hour  Intake             2375 ml  Output                0 ml  Net             2375 ml   Filed  Weights   05/13/16 1122 05/13/16 1621  Weight: 97.2 kg (214 lb 6 oz) 97.5 kg (214 lb 15.2 oz)    Examination: General exam: Pleasant male appears calm and comfortable  Respiratory system: Clear to auscultation. Respiratory effort normal. Cardiovascular system: S1 & S2 heard, RRR. No JVD, murmurs, rubs, gallops or clicks. No pedal edema. Gastrointestinal system: Abdomen is soft, mildly tender without distention. + BS. No rebound. Central nervous system: Alert and oriented. No focal neurological deficits. Extremities: Symmetric 5 x 5 power. Skin: No rashes, lesions or ulcers Psychiatry: Judgement and insight appear normal. Mood & affect appropriate.   Data Reviewed: I have personally reviewed following labs and imaging studies  CBC:  Recent Labs Lab 05/13/16 1137 05/14/16 0500  WBC 13.1* 9.2  HGB 16.9 15.1  HCT 48.0 45.9  MCV 93.6 99.1  PLT 219 0000000   Basic Metabolic Panel:  Recent Labs Lab 05/13/16 1137 05/14/16 0500  NA 140 140  K 4.1 4.2  CL 105 109  CO2 26 24  GLUCOSE 155* 130*  BUN 23* 25*  CREATININE 1.21 1.15  CALCIUM 9.2 8.3*   GFR: Estimated Creatinine Clearance: 65 mL/min (by C-G formula based on SCr of 1.15 mg/dL). Liver Function Tests:  Recent Labs Lab 05/13/16 1137  AST 22  ALT 21  ALKPHOS 123  BILITOT 0.9  PROT 7.6  ALBUMIN 4.3    Recent Labs Lab 05/13/16 1137  LIPASE 12   No results for input(s): AMMONIA in the last 168 hours. Coagulation Profile: No results for input(s): INR, PROTIME in the last 168 hours. Cardiac Enzymes: No results for input(s): CKTOTAL, CKMB, CKMBINDEX, TROPONINI in the last 168 hours. BNP (last 3 results) No results for input(s): PROBNP in the last 8760 hours. HbA1C: No results for input(s): HGBA1C in the last 72 hours. CBG:  Recent Labs Lab 05/14/16 2107 05/15/16 0011 05/15/16 0640 05/15/16 0828 05/15/16 1243  GLUCAP 106* 88 116* 155* 143*   Lipid Profile: No results for input(s): CHOL, HDL,  LDLCALC, TRIG, CHOLHDL, LDLDIRECT in the last 72 hours. Thyroid Function Tests: No results for input(s): TSH, T4TOTAL, FREET4, T3FREE, THYROIDAB in the last 72 hours. Anemia Panel: No results for input(s): VITAMINB12, FOLATE, FERRITIN, TIBC, IRON, RETICCTPCT in the last 72 hours. Sepsis Labs: No results for input(s): PROCALCITON, LATICACIDVEN in the last 168 hours.  No results found for this or any previous visit (from the past 240 hour(s)).   Radiology Studies: No results found.  Scheduled Meds: . heparin  5,000 Units Subcutaneous Q8H  . insulin aspart  0-9 Units Subcutaneous Q4H  . levothyroxine  150 mcg Oral QAC breakfast  . tamsulosin  0.4 mg Oral Daily   Continuous Infusions: . sodium chloride 75 mL/hr at 05/15/16 0823    LOS: 1 day   Time spent: 25 minutes  Albert Gather, MD Albert Richards Pager [phone]   If 7PM-7AM, please contact night-coverage [email or web address] Password [email or web address] 05/15/2016, 2:43 PM

## 2016-05-15 NOTE — Progress Notes (Signed)
Patient ID: Albert Richards, male   DOB: 03-Oct-1940, 75 y.o.   MRN: [id number] [facility] Surgery Progress Note:   * No surgery found *  Subjective: Mental status is clear.  Discussed recurrent disease and management.   Objective: Vital signs in last 24 hours: Temp:  [97.8 F (36.6 C)-98 F (36.7 C)] 97.8 F (36.6 C) (10/18 0454) Pulse Rate:  [52-64] 52 (10/18 0454) Resp:  [16-18] 18 (10/18 0454) BP: (124-148)/(67-90) 145/90 (10/18 0454) SpO2:  [94 %-96 %] 94 % (10/18 0454)  Intake/Output from previous day: 10/17 0701 - 10/18 0700 In: 2495 [P.O.:480; I.V.:2015] Out: 350 [Urine:350] Intake/Output this shift: No intake/output data recorded.  Physical Exam: Work of breathing is normal.  Abdomen is nontender.    Lab Results:  Results for orders placed or performed during the hospital encounter of 05/13/16 (from the past 48 hour(s))  Urinalysis, Routine w reflex microscopic     Status: Abnormal   Collection Time: 05/13/16 11:32 AM  Result Value Ref Range   Color, Urine AMBER (A) YELLOW    Comment: BIOCHEMICALS MAY BE AFFECTED BY COLOR   APPearance CLEAR CLEAR   Specific Gravity, Urine 1.027 1.005 - 1.030   pH 8.0 5.0 - 8.0   Glucose, UA NEGATIVE NEGATIVE mg/dL   Hgb urine dipstick NEGATIVE NEGATIVE   Bilirubin Urine SMALL (A) NEGATIVE   Ketones, ur NEGATIVE NEGATIVE mg/dL   Protein, ur NEGATIVE NEGATIVE mg/dL   Nitrite NEGATIVE NEGATIVE   Leukocytes, UA TRACE (A) NEGATIVE  Urine microscopic-add on     Status: Abnormal   Collection Time: 05/13/16 11:32 AM  Result Value Ref Range   Squamous Epithelial / LPF 0-5 (A) NONE SEEN   WBC, UA 6-30 0 - 5 WBC/hpf   RBC / HPF 0-5 0 - 5 RBC/hpf   Bacteria, UA MANY (A) NONE SEEN   Urine-Other MUCOUS PRESENT   Lipase, blood     Status: None   Collection Time: 05/13/16 11:37 AM  Result Value Ref Range   Lipase 12 11 - 51 U/L  Comprehensive metabolic panel     Status: Abnormal   Collection Time: 05/13/16 11:37 AM  Result Value  Ref Range   Sodium 140 135 - 145 mmol/L   Potassium 4.1 3.5 - 5.1 mmol/L   Chloride 105 101 - 111 mmol/L   CO2 26 22 - 32 mmol/L   Glucose, Bld 155 (H) 65 - 99 mg/dL   BUN 23 (H) 6 - 20 mg/dL   Creatinine, Ser 1.21 0.61 - 1.24 mg/dL   Calcium 9.2 8.9 - 10.3 mg/dL   Total Protein 7.6 6.5 - 8.1 g/dL   Albumin 4.3 3.5 - 5.0 g/dL   AST 22 15 - 41 U/L   ALT 21 17 - 63 U/L   Alkaline Phosphatase 123 38 - 126 U/L   Total Bilirubin 0.9 0.3 - 1.2 mg/dL   GFR calc non Af Amer 57 (L) >60 mL/min   GFR calc Af Amer >60 >60 mL/min    Comment: (NOTE) The eGFR has been calculated using the CKD EPI equation. This calculation has not been validated in all clinical situations. eGFR's persistently <60 mL/min signify possible Chronic Kidney Disease.    Anion gap 9 5 - 15  CBC     Status: Abnormal   Collection Time: 05/13/16 11:37 AM  Result Value Ref Range   WBC 13.1 (H) 4.0 - 10.5 K/uL   RBC 5.13 4.22 - 5.81 MIL/uL   Hemoglobin 16.9 13.0 -  17.0 g/dL   HCT 48.0 39.0 - 52.0 %   MCV 93.6 78.0 - 100.0 fL   MCH 32.9 26.0 - 34.0 pg   MCHC 35.2 30.0 - 36.0 g/dL   RDW 13.6 11.5 - 15.5 %   Platelets 219 150 - 400 K/uL  Glucose, capillary     Status: Abnormal   Collection Time: 05/13/16  5:14 PM  Result Value Ref Range   Glucose-Capillary 117 (H) 65 - 99 mg/dL  Glucose, capillary     Status: Abnormal   Collection Time: 05/13/16  8:02 PM  Result Value Ref Range   Glucose-Capillary 113 (H) 65 - 99 mg/dL  Glucose, capillary     Status: Abnormal   Collection Time: 05/14/16 12:02 AM  Result Value Ref Range   Glucose-Capillary 123 (H) 65 - 99 mg/dL  Glucose, capillary     Status: Abnormal   Collection Time: 05/14/16  4:03 AM  Result Value Ref Range   Glucose-Capillary 131 (H) 65 - 99 mg/dL  Basic metabolic panel     Status: Abnormal   Collection Time: 05/14/16  5:00 AM  Result Value Ref Range   Sodium 140 135 - 145 mmol/L   Potassium 4.2 3.5 - 5.1 mmol/L   Chloride 109 101 - 111 mmol/L   CO2 24  22 - 32 mmol/L   Glucose, Bld 130 (H) 65 - 99 mg/dL   BUN 25 (H) 6 - 20 mg/dL   Creatinine, Ser 1.15 0.61 - 1.24 mg/dL   Calcium 8.3 (L) 8.9 - 10.3 mg/dL   GFR calc non Af Amer >60 >60 mL/min   GFR calc Af Amer >60 >60 mL/min    Comment: (NOTE) The eGFR has been calculated using the CKD EPI equation. This calculation has not been validated in all clinical situations. eGFR's persistently <60 mL/min signify possible Chronic Kidney Disease.    Anion gap 7 5 - 15  CBC     Status: None   Collection Time: 05/14/16  5:00 AM  Result Value Ref Range   WBC 9.2 4.0 - 10.5 K/uL   RBC 4.63 4.22 - 5.81 MIL/uL   Hemoglobin 15.1 13.0 - 17.0 g/dL   HCT 45.9 39.0 - 52.0 %   MCV 99.1 78.0 - 100.0 fL   MCH 32.6 26.0 - 34.0 pg   MCHC 32.9 30.0 - 36.0 g/dL   RDW 14.1 11.5 - 15.5 %   Platelets 188 150 - 400 K/uL  Glucose, capillary     Status: Abnormal   Collection Time: 05/14/16  7:35 AM  Result Value Ref Range   Glucose-Capillary 125 (H) 65 - 99 mg/dL   Comment 1 Notify RN    Comment 2 Document in Chart   Glucose, capillary     Status: None   Collection Time: 05/14/16 11:44 AM  Result Value Ref Range   Glucose-Capillary 93 65 - 99 mg/dL   Comment 1 Notify RN    Comment 2 Document in Chart   Glucose, capillary     Status: Abnormal   Collection Time: 05/14/16  4:00 PM  Result Value Ref Range   Glucose-Capillary 143 (H) 65 - 99 mg/dL   Comment 1 Notify RN    Comment 2 Document in Chart   Glucose, capillary     Status: Abnormal   Collection Time: 05/14/16  9:07 PM  Result Value Ref Range   Glucose-Capillary 106 (H) 65 - 99 mg/dL  Glucose, capillary     Status: None   Collection  Time: 05/15/16 12:11 AM  Result Value Ref Range   Glucose-Capillary 88 65 - 99 mg/dL  Glucose, capillary     Status: Abnormal   Collection Time: 05/15/16  6:40 AM  Result Value Ref Range   Glucose-Capillary 116 (H) 65 - 99 mg/dL   Comment 1 Notify RN   Glucose, capillary     Status: Abnormal   Collection Time:  05/15/16  8:28 AM  Result Value Ref Range   Glucose-Capillary 155 (H) 65 - 99 mg/dL   Comment 1 Notify RN    Comment 2 Document in Chart     Radiology/Results: Ct Abdomen Pelvis W Contrast  Result Date: 05/13/2016 CLINICAL DATA:  Lower abdominal pain. Current history of pancreatic cancer. EXAM: CT ABDOMEN AND PELVIS WITH CONTRAST TECHNIQUE: Multidetector CT imaging of the abdomen and pelvis was performed using the standard protocol following bolus administration of intravenous contrast. CONTRAST:  131m ISOVUE-300 IOPAMIDOL (ISOVUE-300) INJECTION 61% COMPARISON:  CT scan of April 22, 2016. FINDINGS: Lower chest: At least 2 nodules are noted in the right lung base concerning for pulmonary metastases. Hepatobiliary: No gallstones are noted.  Normal liver. Pancreas: Stable pancreatic atrophy. Spleen: No abnormality seen. Adrenals/Urinary Tract: Adrenal glands appear normal. Stable bilateral renal cysts. No hydronephrosis or renal obstruction is noted. No renal or ureteral calculi are noted. Urinary bladder appears normal. Stomach/Bowel: Mild small bowel obstruction is noted with transition zone seen in central portion of abdomen best seen on image number 50 of series 2. 2 cm soft tissue abnormality is noted along the small bowel in this area that appears to be called on the extraction, consistent with peritoneal metastatic implant. No colonic dilatation is noted probable 1.7 cm peritoneal implants seen at the junction of the sigmoid colon and rectum. Several other implants are seen along the small bowel. Several nodular densities are seen in the right side of the omentum anteriorly concerning for peritoneal implants. Vascular/Lymphatic: Atherosclerosis of abdominal aorta is noted without aneurysm formation. No significant adenopathy is noted. Reproductive: Stable mild prostatic enlargement is noted. Other: No abnormal fluid collection is noted. Musculoskeletal: Multilevel degenerative disc disease is  noted in the lower lumbar spine. 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 Stable pancreatic atrophy. 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 Mild small bowel obstruction is noted, with transition zone seen centrally in the abdomen, due to peritoneal implant on the small bowel in this area. Electronically Signed   By: JMarijo Conception M.D.   On: 05/13/2016 14:56    Anti-infectives: Anti-infectives    None      Assessment/Plan: Problem List: Patient Active Problem List   Diagnosis Date Noted  . Nausea vomiting and diarrhea   . SBO (small bowel obstruction) 05/13/2016  . Diabetes mellitus ([place] 05/13/2016  . Hypothyroid 05/13/2016  . Atherosclerosis of aorta ([place] 07/07/2013  . Stage IV adenocarcinoma of pancreas ([place] 06/30/2013  . COPD (chronic obstructive pulmonary disease) ([place] 06/30/2013    Will advance to full liquids and see how he tolerates.   * No surgery found *    LOS: 1 day   Matt B. MHassell Done MD, FTexas End[facility], [facility] [phone] 05/15/2016 8:39 AM

## 2016-05-16 LAB — GLUCOSE, CAPILLARY
GLUCOSE-CAPILLARY: 110 mg/dL — AB (ref 65–99)
GLUCOSE-CAPILLARY: 129 mg/dL — AB (ref 65–99)
Glucose-Capillary: 105 mg/dL — ABNORMAL HIGH (ref 65–99)
Glucose-Capillary: 173 mg/dL — ABNORMAL HIGH (ref 65–99)

## 2016-05-16 MED ORDER — HEPARIN SOD (PORK) LOCK FLUSH 100 UNIT/ML IV SOLN
500.0000 [IU] | INTRAVENOUS | Status: AC | PRN
  Administered 2016-05-16: 500 [IU]
  Filled 2016-05-16: qty 5

## 2016-05-16 NOTE — Progress Notes (Signed)
Patient verbalized understanding of discharge instructions. Patient was stable at discharge.

## 2016-05-16 NOTE — Discharge Summary (Signed)
Physician Discharge Summary  Albert Richards [name] DOB: 1940-11-01 DOA: 05/13/2016  PCP: [name], MD  Admit date: 05/13/2016 Discharge date: 05/16/2016  Admitted From: Home Disposition: Home   Recommendations for Outpatient Follow-up:  1. Follow up with oncology, Dr. Benay Spice, in next 2 weeks. Per last office visit note, salvage chemotherapy would be considered if progression is seen on CT.   Home Health: None Equipment/Devices: None Discharge Condition: Stable CODE STATUS: Full Diet recommendation: Soft and advance as tolerated.  Brief/Interim Summary: [name] a 75 y.o.male with a history of stage IV metastatic pancreatic cancer, hypothyroidism, COPD, and DM who presented on 10/16 with complaints of abdominal pain, nausea and vomiting and diarrhea. He was found to have a SBO related to metastatic disease. General surgery was involved in care, though no surgery was required. Diet was advanced slowly until he tolerated a soft diet on 10/19 with no further symptoms. He is discharged with return precautions and directions to follow up with oncology.  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  - Tolerated soft diet today. - Surgery following, signed off.  Stage IV pancreatic cancer: - Per oncology, Dr. Benay Spice   Hypothyroidism: TSH 0.452 in July 2017. - Resumed synthroid.   Diabetes mellitus: At inpatient goal on SSI - Continue SSI  COPD: Chronic, stable, without exacerbation - Monitor  Discharge Instructions Discharge Instructions    Diet - low sodium heart healthy    Complete by:  As directed    Discharge instructions    Complete by:  As  directed    You were admitted for a small bowel obstruction due to metastatic cancer. This has fortunately resolved, so you may be discharged with the following recommendations:  - If your symptoms return, you are unable to eat without nausea and/or vomiting, you must present for medical evaluation.  - Follow up with Dr. Benay Spice. Call for an appointment.   Increase activity slowly    Complete by:  As directed        Medication List    TAKE these medications   gabapentin 600 MG tablet Commonly known as:  NEURONTIN Take 600 mg by mouth 2 (two) times daily.   glipiZIDE 10 MG 24 hr tablet Commonly known as:  GLUCOTROL XL Take 10 mg by mouth daily with breakfast.   HYDROcodone-acetaminophen 5-325 MG tablet Commonly known as:  NORCO/VICODIN Take 1-2 tablets every 4-6 hours as needed for pain What changed:  how much to take  how to take this  when to take this  reasons to take this  additional instructions   levothyroxine 150 MCG tablet Commonly known as:  SYNTHROID, LEVOTHROID Take 150 mcg by mouth every other day. What changed:  Another medication with the same name was removed. Continue taking this medication, and follow the directions you see here.   naproxen sodium 220 MG tablet Commonly known as:  ANAPROX Take 220 mg by mouth 2 (two) times daily as needed.   nitroGLYCERIN 0.4 MG SL tablet Commonly known as:  NITROSTAT Place 0.4 mg under the tongue as needed for chest pain.   omeprazole 20 MG capsule Commonly known as:  PRILOSEC Take 20 mg by mouth every morning.   tamsulosin 0.4 MG Caps capsule  Commonly known as:  FLOMAX Take 0.4 mg by mouth every morning.      Follow-up Information    [name], MD .   Specialty:  Internal Medicine Contact information: [address] [place] [address] [phone]        Betsy Coder, MD. Schedule an appointment as soon as possible for a visit today.   Specialty:  Oncology Contact  information: [place] [address] [phone]          No Known Allergies  Consultations:  General surgery, Dr. Hassell Done  Oncology, Dr. Benay Spice  Procedures/Studies: Ct Chest W Contrast  Result Date: 04/22/2016 CLINICAL DATA:  Stage IV pancreatic adenocarcinoma presenting for restaging with worsening abdominal pain. Most recent chemotherapy/ radiation therapy in December 2016. EXAM: CT CHEST, ABDOMEN, AND PELVIS WITH CONTRAST TECHNIQUE: Multidetector CT imaging of the chest, abdomen and pelvis was performed following the standard protocol during bolus administration of intravenous contrast. CONTRAST:  134mL ISOVUE-300 IOPAMIDOL (ISOVUE-300) INJECTION 61% COMPARISON:  12/29/2015 CT chest, abdomen and pelvis. FINDINGS: CT CHEST FINDINGS Cardiovascular: Normal heart size. No significant pericardial fluid/thickening. Left subclavian Port-A-Cath terminates in the lower third of the superior vena cava. Left anterior descending, left circumflex and right coronary atherosclerosis. Atherosclerotic nonaneurysmal thoracic aorta. Normal caliber pulmonary arteries. No central pulmonary emboli. Mediastinum/Nodes: No discrete thyroid nodules. Unremarkable esophagus. No pathologically enlarged axillary, mediastinal or hilar lymph nodes. Lungs/Pleura: No pneumothorax. No pleural effusion. There are greater than 10 pulmonary nodules of various sizes scattered throughout the lungs bilaterally, which have increased in size since 12/29/2015. For example a right lower lobe 9 mm solid nodule (series 7/ image 75), increased from 3 mm. A left lower lobe 9 mm solid nodule (series 7/ image 61) is increased from 7 mm. A peripheral right upper lobe 12 mm nodule with indistinct margins (series 7/ image 36) is increased from 9 mm. No acute consolidative airspace disease. Musculoskeletal: Small lytic bone foci along the endplates are not appreciably changed and are favored to represent Schmorl's nodes. No  appreciable new bone lesions. Mild-to-moderate thoracic spondylosis. CT ABDOMEN PELVIS FINDINGS Hepatobiliary: Normal liver with no liver mass. Normal gallbladder with no radiopaque cholelithiasis. No biliary ductal dilatation. Pancreas: Stable atrophy of the pancreatic tail. No discrete pancreatic mass or peripancreatic fluid collection. No pancreatic duct dilation. Spleen: Normal spleen size. Stable irregular contour of the spleen, probably from prior splenic infarcts. No splenic mass. Adrenals/Urinary Tract: Normal adrenals. No hydronephrosis. Simple right renal cysts measuring up to 7.7 cm in the upper right kidney. Simple exophytic 3.3 cm renal cyst in the medial lower left kidney. Subcentimeter hypodense renal cortical lesions in both kidneys are too small to characterize and not appreciably changed, probably benign renal cysts. Normal bladder. Stomach/Bowel: Grossly normal stomach. Normal caliber small bowel with no small bowel wall thickening. Normal appendix. Minimal sigmoid diverticulosis, with no large bowel wall thickening or pericolonic fat stranding. Oral contrast progresses to the distal colon. Vascular/Lymphatic: Atherosclerotic nonaneurysmal abdominal aorta. Patent portal, splenic, hepatic and renal veins. No pathologically enlarged lymph nodes in the abdomen or pelvis. Reproductive: Stable mildly enlarged prostate. Other: No pneumoperitoneum, ascites or focal fluid collection. There are 2 omental nodules in the anterior right abdomen, largest 1.7 x 0.9 cm (series 4/ image 71), previously 1.2 x 0.7 cm, mildly increased. No new peritoneal nodules. Musculoskeletal: No aggressive appearing focal osseous lesions. Stable L1 and L2 vertebral hemangiomas. Moderate lumbar spondylosis. IMPRESSION: 1. Interval growth of greater than 10 pulmonary nodules scattered throughout both  lungs, most consistent with progressive pulmonary metastases. 2. Small right omental nodules have mildly increased in size, most  consistent with enlarging omental metastases. 3. No additional sites of metastatic disease in the chest, abdomen or pelvis. 4. Stable pancreatic tail atrophy. No discrete pancreatic mass to suggest local tumor recurrence. 5. Additional findings include aortic atherosclerosis, three-vessel coronary atherosclerosis, mild prostatomegaly and minimal sigmoid diverticulosis. Electronically Signed   By: Ilona Sorrel M.D.   On: 04/22/2016 15:58   Ct Abdomen Pelvis W Contrast  Result Date: 05/13/2016 CLINICAL DATA:  Lower abdominal pain. Current history of pancreatic cancer. EXAM: CT ABDOMEN AND PELVIS WITH CONTRAST TECHNIQUE: Multidetector CT imaging of the abdomen and pelvis was performed using the standard protocol following bolus administration of intravenous contrast. CONTRAST:  149mL ISOVUE-300 IOPAMIDOL (ISOVUE-300) INJECTION 61% COMPARISON:  CT scan of April 22, 2016. FINDINGS: Lower chest: At least 2 nodules are noted in the right lung base concerning for pulmonary metastases. Hepatobiliary: No gallstones are noted.  Normal liver. Pancreas: Stable pancreatic atrophy. Spleen: No abnormality seen. Adrenals/Urinary Tract: Adrenal glands appear normal. Stable bilateral renal cysts. No hydronephrosis or renal obstruction is noted. No renal or ureteral calculi are noted. Urinary bladder appears normal. Stomach/Bowel: Mild small bowel obstruction is noted with transition zone seen in central portion of abdomen best seen on image number 50 of series 2. 2 cm soft tissue abnormality is noted along the small bowel in this area that appears to be called on the extraction, consistent with peritoneal metastatic implant. No colonic dilatation is noted probable 1.7 cm peritoneal implants seen at the junction of the sigmoid colon and rectum. Several other implants are seen along the small bowel. Several nodular densities are seen in the right side of the omentum anteriorly concerning for peritoneal implants.  Vascular/Lymphatic: Atherosclerosis of abdominal aorta is noted without aneurysm formation. No significant adenopathy is noted. Reproductive: Stable mild prostatic enlargement is noted. Other: No abnormal fluid collection is noted. Musculoskeletal: Multilevel degenerative disc disease is noted in the lower lumbar spine. 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 Stable pancreatic atrophy. 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 Mild small bowel obstruction is noted, with transition zone seen centrally in the abdomen, due to peritoneal implant on the small bowel in this area. Electronically Signed   By: Marijo Conception, M.D.   On: 05/13/2016 14:56   Ct Abdomen Pelvis W Contrast  Result Date: 04/22/2016 CLINICAL DATA:  Stage IV pancreatic adenocarcinoma presenting for restaging with worsening abdominal pain. Most recent chemotherapy/ radiation therapy in December 2016. EXAM: CT CHEST, ABDOMEN, AND PELVIS WITH CONTRAST TECHNIQUE: Multidetector CT imaging of the chest, abdomen and pelvis was performed following the standard protocol during bolus administration of intravenous contrast. CONTRAST:  146mL ISOVUE-300 IOPAMIDOL (ISOVUE-300) INJECTION 61% COMPARISON:  12/29/2015 CT chest, abdomen and pelvis. FINDINGS: CT CHEST FINDINGS Cardiovascular: Normal heart size. No significant pericardial fluid/thickening. Left subclavian Port-A-Cath terminates in the lower third of the superior vena cava. Left anterior descending, left circumflex and right coronary atherosclerosis. Atherosclerotic nonaneurysmal thoracic aorta. Normal caliber pulmonary arteries. No central pulmonary emboli. Mediastinum/Nodes: No discrete thyroid nodules. Unremarkable esophagus. No pathologically enlarged axillary, mediastinal or hilar lymph nodes. Lungs/Pleura: No pneumothorax. No pleural effusion. There are  greater than 10 pulmonary nodules of various sizes scattered throughout the lungs bilaterally, which have increased in size since 12/29/2015. For example a right lower lobe  9 mm solid nodule (series 7/ image 75), increased from 3 mm. A left lower lobe 9 mm solid nodule (series 7/ image 61) is increased from 7 mm. A peripheral right upper lobe 12 mm nodule with indistinct margins (series 7/ image 36) is increased from 9 mm. No acute consolidative airspace disease. Musculoskeletal: Small lytic bone foci along the endplates are not appreciably changed and are favored to represent Schmorl's nodes. No appreciable new bone lesions. Mild-to-moderate thoracic spondylosis. CT ABDOMEN PELVIS FINDINGS Hepatobiliary: Normal liver with no liver mass. Normal gallbladder with no radiopaque cholelithiasis. No biliary ductal dilatation. Pancreas: Stable atrophy of the pancreatic tail. No discrete pancreatic mass or peripancreatic fluid collection. No pancreatic duct dilation. Spleen: Normal spleen size. Stable irregular contour of the spleen, probably from prior splenic infarcts. No splenic mass. Adrenals/Urinary Tract: Normal adrenals. No hydronephrosis. Simple right renal cysts measuring up to 7.7 cm in the upper right kidney. Simple exophytic 3.3 cm renal cyst in the medial lower left kidney. Subcentimeter hypodense renal cortical lesions in both kidneys are too small to characterize and not appreciably changed, probably benign renal cysts. Normal bladder. Stomach/Bowel: Grossly normal stomach. Normal caliber small bowel with no small bowel wall thickening. Normal appendix. Minimal sigmoid diverticulosis, with no large bowel wall thickening or pericolonic fat stranding. Oral contrast progresses to the distal colon. Vascular/Lymphatic: Atherosclerotic nonaneurysmal abdominal aorta. Patent portal, splenic, hepatic and renal veins. No pathologically enlarged lymph nodes in the abdomen or pelvis. Reproductive: Stable mildly enlarged  prostate. Other: No pneumoperitoneum, ascites or focal fluid collection. There are 2 omental nodules in the anterior right abdomen, largest 1.7 x 0.9 cm (series 4/ image 71), previously 1.2 x 0.7 cm, mildly increased. No new peritoneal nodules. Musculoskeletal: No aggressive appearing focal osseous lesions. Stable L1 and L2 vertebral hemangiomas. Moderate lumbar spondylosis. IMPRESSION: 1. Interval growth of greater than 10 pulmonary nodules scattered throughout both lungs, most consistent with progressive pulmonary metastases. 2. Small right omental nodules have mildly increased in size, most consistent with enlarging omental metastases. 3. No additional sites of metastatic disease in the chest, abdomen or pelvis. 4. Stable pancreatic tail atrophy. No discrete pancreatic mass to suggest local tumor recurrence. 5. Additional findings include aortic atherosclerosis, three-vessel coronary atherosclerosis, mild prostatomegaly and minimal sigmoid diverticulosis. Electronically Signed   By: Ilona Sorrel M.D.   On: 04/22/2016 15:58     Subjective: Pt eager to go home. Tolerated soft diet without any nausea, vomiting, abdominal pain.   Discharge Exam: Vitals:   05/16/16 0411 05/16/16 1359  BP: (!) 158/85 (!) 144/82  Pulse: (!) 50 (!) 55  Resp: 17 16  Temp: 97.5 F (36.4 C) 98.4 F (36.9 C)   Vitals:   05/15/16 1334 05/15/16 2008 05/16/16 0411 05/16/16 1359  BP: 135/83 (!) 147/97 (!) 158/85 (!) 144/82  Pulse: (!) 57 (!) 57 (!) 50 (!) 55  Resp: 16 16 17 16   Temp: 98.2 F (36.8 C) 97.7 F (36.5 C) 97.5 F (36.4 C) 98.4 F (36.9 C)  TempSrc: Oral Oral Oral Oral  SpO2: 93% 98% 97% 94%  Weight:      Height:       General: Pt is alert, awake, not in acute distress Cardiovascular: RRR, S1/S2 +, no rubs, no gallops Respiratory: CTA bilaterally, no wheezing, no rhonchi Abdominal: Soft, NT, ND, bowel sounds + Extremities: no edema, no cyanosis  The results of significant diagnostics from this  hospitalization (including imaging, microbiology, ancillary and laboratory) are listed below for reference.  Microbiology: No results found for this or any previous visit (from the past 240 hour(s)).   Labs: BNP (last 3 results) No results for input(s): BNP in the last 8760 hours. Basic Metabolic Panel:  Recent Labs Lab 05/13/16 1137 05/14/16 0500  NA 140 140  K 4.1 4.2  CL 105 109  CO2 26 24  GLUCOSE 155* 130*  BUN 23* 25*  CREATININE 1.21 1.15  CALCIUM 9.2 8.3*   Liver Function Tests:  Recent Labs Lab 05/13/16 1137  AST 22  ALT 21  ALKPHOS 123  BILITOT 0.9  PROT 7.6  ALBUMIN 4.3    Recent Labs Lab 05/13/16 1137  LIPASE 12   No results for input(s): AMMONIA in the last 168 hours. CBC:  Recent Labs Lab 05/13/16 1137 05/14/16 0500  WBC 13.1* 9.2  HGB 16.9 15.1  HCT 48.0 45.9  MCV 93.6 99.1  PLT 219 188   Cardiac Enzymes: No results for input(s): CKTOTAL, CKMB, CKMBINDEX, TROPONINI in the last 168 hours. BNP: Invalid input(s): POCBNP CBG:  Recent Labs Lab 05/15/16 2004 05/16/16 0004 05/16/16 0403 05/16/16 0759 05/16/16 1147  GLUCAP 182* 110* 105* 129* 173*   D-Dimer No results for input(s): DDIMER in the last 72 hours. Hgb A1c No results for input(s): HGBA1C in the last 72 hours. Lipid Profile No results for input(s): CHOL, HDL, LDLCALC, TRIG, CHOLHDL, LDLDIRECT in the last 72 hours. Thyroid function studies No results for input(s): TSH, T4TOTAL, T3FREE, THYROIDAB in the last 72 hours.  Invalid input(s): FREET3 Anemia work up No results for input(s): VITAMINB12, FOLATE, FERRITIN, TIBC, IRON, RETICCTPCT in the last 72 hours. Urinalysis    Component Value Date/Time   COLORURINE AMBER (A) 05/13/2016 1132   APPEARANCEUR CLEAR 05/13/2016 1132   LABSPEC 1.027 05/13/2016 1132   PHURINE 8.0 05/13/2016 1132   GLUCOSEU NEGATIVE 05/13/2016 1132   HGBUR NEGATIVE 05/13/2016 1132   BILIRUBINUR SMALL (A) 05/13/2016 1132   KETONESUR NEGATIVE  05/13/2016 1132   PROTEINUR NEGATIVE 05/13/2016 1132   NITRITE NEGATIVE 05/13/2016 1132   LEUKOCYTESUR TRACE (A) 05/13/2016 1132   Sepsis Labs Invalid input(s): PROCALCITONIN,  WBC,  LACTICIDVEN Microbiology No results found for this or any previous visit (from the past 240 hour(s)).  Time coordinating discharge: Over 30 minutes  Vance Gather, MD  [facility] 05/16/2016, 4:13 PM Pager [phone]  If 7PM-7AM, please contact night-coverage [email or web address] Password [email or web address]

## 2016-05-21 ENCOUNTER — Inpatient Hospital Stay (HOSPITAL_COMMUNITY)
Admission: EM | Admit: 2016-05-21 | Discharge: 2016-05-24 | DRG: 437 | Disposition: A | Discharge status: Home / Self Care | Payer: Medicare Other | Attending: Internal Medicine | Admitting: Internal Medicine

## 2016-05-21 ENCOUNTER — Telehealth: Payer: Self-pay | Admitting: *Deleted

## 2016-05-21 ENCOUNTER — Encounter (HOSPITAL_COMMUNITY): Payer: Self-pay

## 2016-05-21 ENCOUNTER — Emergency Department (HOSPITAL_COMMUNITY): Payer: Medicare Other

## 2016-05-21 DIAGNOSIS — E039 Hypothyroidism, unspecified: Secondary | ICD-10-CM | POA: Diagnosis present

## 2016-05-21 DIAGNOSIS — Z79899 Other long term (current) drug therapy: Secondary | ICD-10-CM | POA: Diagnosis not present

## 2016-05-21 DIAGNOSIS — Z87891 Personal history of nicotine dependence: Secondary | ICD-10-CM

## 2016-05-21 DIAGNOSIS — Z7984 Long term (current) use of oral hypoglycemic drugs: Secondary | ICD-10-CM

## 2016-05-21 DIAGNOSIS — E119 Type 2 diabetes mellitus without complications: Secondary | ICD-10-CM | POA: Diagnosis present

## 2016-05-21 DIAGNOSIS — D72829 Elevated white blood cell count, unspecified: Secondary | ICD-10-CM | POA: Diagnosis present

## 2016-05-21 DIAGNOSIS — K56609 Unspecified intestinal obstruction, unspecified as to partial versus complete obstruction: Secondary | ICD-10-CM

## 2016-05-21 DIAGNOSIS — C259 Malignant neoplasm of pancreas, unspecified: Secondary | ICD-10-CM | POA: Diagnosis present

## 2016-05-21 DIAGNOSIS — Z89022 Acquired absence of left finger(s): Secondary | ICD-10-CM | POA: Diagnosis not present

## 2016-05-21 DIAGNOSIS — J449 Chronic obstructive pulmonary disease, unspecified: Secondary | ICD-10-CM | POA: Diagnosis present

## 2016-05-21 DIAGNOSIS — C799 Secondary malignant neoplasm of unspecified site: Secondary | ICD-10-CM | POA: Diagnosis not present

## 2016-05-21 DIAGNOSIS — J438 Other emphysema: Secondary | ICD-10-CM | POA: Diagnosis not present

## 2016-05-21 HISTORY — DX: Unspecified intestinal obstruction, unspecified as to partial versus complete obstruction: K56.609

## 2016-05-21 LAB — LIPASE, BLOOD: Lipase: 13 U/L (ref 11–51)

## 2016-05-21 LAB — CBC
HCT: 45.4 % (ref 39.0–52.0)
Hemoglobin: 15.7 g/dL (ref 13.0–17.0)
MCH: 32.8 pg (ref 26.0–34.0)
MCHC: 34.6 g/dL (ref 30.0–36.0)
MCV: 94.8 fL (ref 78.0–100.0)
Platelets: 216 10*3/uL (ref 150–400)
RBC: 4.79 MIL/uL (ref 4.22–5.81)
RDW: 13.8 % (ref 11.5–15.5)
WBC: 12.9 10*3/uL — ABNORMAL HIGH (ref 4.0–10.5)

## 2016-05-21 LAB — GLUCOSE, CAPILLARY
GLUCOSE-CAPILLARY: 109 mg/dL — AB (ref 65–99)
GLUCOSE-CAPILLARY: 90 mg/dL (ref 65–99)

## 2016-05-21 LAB — URINALYSIS, ROUTINE W REFLEX MICROSCOPIC
Bilirubin Urine: NEGATIVE
Glucose, UA: NEGATIVE mg/dL
Hgb urine dipstick: NEGATIVE
Ketones, ur: NEGATIVE mg/dL
Leukocytes, UA: NEGATIVE
Nitrite: NEGATIVE
Protein, ur: NEGATIVE mg/dL
Specific Gravity, Urine: 1.017 (ref 1.005–1.030)
pH: 6 (ref 5.0–8.0)

## 2016-05-21 LAB — COMPREHENSIVE METABOLIC PANEL
ALT: 15 U/L — ABNORMAL LOW (ref 17–63)
AST: 19 U/L (ref 15–41)
Albumin: 4 g/dL (ref 3.5–5.0)
Alkaline Phosphatase: 110 U/L (ref 38–126)
Anion gap: 9 (ref 5–15)
BUN: 13 mg/dL (ref 6–20)
CO2: 23 mmol/L (ref 22–32)
Calcium: 8.2 mg/dL — ABNORMAL LOW (ref 8.9–10.3)
Chloride: 108 mmol/L (ref 101–111)
Creatinine, Ser: 0.86 mg/dL (ref 0.61–1.24)
GFR calc Af Amer: >60 mL/min (ref >60)
GFR calc non Af Amer: >60 mL/min (ref >60)
Glucose, Bld: 136 mg/dL — ABNORMAL HIGH (ref 65–99)
Potassium: 3.5 mmol/L (ref 3.5–5.1)
Sodium: 140 mmol/L (ref 135–145)
Total Bilirubin: 0.7 mg/dL (ref 0.3–1.2)
Total Protein: 7 g/dL (ref 6.5–8.1)

## 2016-05-21 MED ORDER — ACETAMINOPHEN 650 MG RE SUPP: 650.0000 mg | Freq: Four times a day (QID) | RECTAL | Status: DC | PRN

## 2016-05-21 MED ORDER — MORPHINE SULFATE (PF) 4 MG/ML IV SOLN
4.0000 mg | INTRAVENOUS | Status: DC | PRN
  Administered 2016-05-21 – 2016-05-23 (×2): 4 mg via INTRAVENOUS
  Filled 2016-05-21 (×2): qty 1

## 2016-05-21 MED ORDER — IOPAMIDOL (ISOVUE-300) INJECTION 61%
100.0000 mL | Freq: Once | INTRAVENOUS | Status: AC | PRN
  Administered 2016-05-21: 100 mL via INTRAVENOUS

## 2016-05-21 MED ORDER — ACETAMINOPHEN 325 MG PO TABS: 650.0000 mg | ORAL_TABLET | Freq: Four times a day (QID) | ORAL | Status: DC | PRN

## 2016-05-21 MED ORDER — LEVOTHYROXINE SODIUM 50 MCG PO TABS
150.0000 ug | ORAL_TABLET | ORAL | Status: DC
  Administered 2016-05-23: 150 ug via ORAL
  Filled 2016-05-21: qty 1

## 2016-05-21 MED ORDER — MORPHINE SULFATE (PF) 4 MG/ML IV SOLN
6.0000 mg | Freq: Once | INTRAVENOUS | Status: AC
  Administered 2016-05-21: 6 mg via INTRAVENOUS
  Filled 2016-05-21: qty 2

## 2016-05-21 MED ORDER — HEPARIN SODIUM (PORCINE) 5000 UNIT/ML IJ SOLN
5000.0000 [IU] | Freq: Three times a day (TID) | INTRAMUSCULAR | Status: DC
  Administered 2016-05-21 – 2016-05-24 (×7): 5000 [IU] via SUBCUTANEOUS
  Filled 2016-05-21 (×7): qty 1

## 2016-05-21 MED ORDER — TAMSULOSIN HCL 0.4 MG PO CAPS
0.4000 mg | ORAL_CAPSULE | Freq: Every day | ORAL | Status: DC
  Administered 2016-05-22 – 2016-05-24 (×3): 0.4 mg via ORAL
  Filled 2016-05-21 (×3): qty 1

## 2016-05-21 MED ORDER — HYDRALAZINE HCL 20 MG/ML IJ SOLN: 10.0000 mg | INTRAMUSCULAR | Status: DC | PRN

## 2016-05-21 MED ORDER — SODIUM CHLORIDE 0.9 % IV SOLN
INTRAVENOUS | Status: AC
  Administered 2016-05-21: 20:00:00 via INTRAVENOUS

## 2016-05-21 MED ORDER — INSULIN ASPART 100 UNIT/ML ~~LOC~~ SOLN
0.0000 [IU] | SUBCUTANEOUS | Status: DC
  Administered 2016-05-23 (×2): 2 [IU] via SUBCUTANEOUS

## 2016-05-21 MED ORDER — SODIUM CHLORIDE 0.9 % IV SOLN
INTRAVENOUS | Status: DC
  Administered 2016-05-21: 17:00:00 via INTRAVENOUS

## 2016-05-21 MED ORDER — ONDANSETRON HCL 4 MG/2ML IJ SOLN: 4.0000 mg | Freq: Four times a day (QID) | INTRAMUSCULAR | Status: DC | PRN

## 2016-05-21 MED ORDER — IPRATROPIUM-ALBUTEROL 0.5-2.5 (3) MG/3ML IN SOLN: 3.0000 mL | RESPIRATORY_TRACT | Status: DC | PRN

## 2016-05-21 MED ORDER — GABAPENTIN 300 MG PO CAPS
600.0000 mg | ORAL_CAPSULE | Freq: Two times a day (BID) | ORAL | Status: DC
  Administered 2016-05-22 – 2016-05-24 (×5): 600 mg via ORAL
  Filled 2016-05-21 (×5): qty 2

## 2016-05-21 MED ORDER — ONDANSETRON HCL 4 MG/2ML IJ SOLN
4.0000 mg | Freq: Once | INTRAMUSCULAR | Status: AC
  Administered 2016-05-21: 4 mg via INTRAVENOUS
  Filled 2016-05-21: qty 2

## 2016-05-21 MED ORDER — ONDANSETRON HCL 4 MG PO TABS: 4.0000 mg | ORAL_TABLET | Freq: Four times a day (QID) | ORAL | Status: DC | PRN

## 2016-05-21 MED ORDER — FAMOTIDINE IN NACL 20-0.9 MG/50ML-% IV SOLN
20.0000 mg | Freq: Two times a day (BID) | INTRAVENOUS | Status: DC
  Administered 2016-05-21 – 2016-05-23 (×5): 20 mg via INTRAVENOUS
  Filled 2016-05-21 (×5): qty 50

## 2016-05-21 NOTE — ED Notes (Signed)
Pt has a port-a-cath and would prefer for blood draws from his port.

## 2016-05-21 NOTE — H&P (Signed)
History and Physical    Albert Richards [name] DOB: [birth year] DOA: 05/21/2016  PCP: [name], MD   Patient coming from: Home  Chief Complaint: Acute abdominal pain with nausea   HPI: Albert Richards is a 75 y.o. male with medical history significant for type 2 diabetes mellitus, hypothyroidism, and stage IV pancreatic cancer who presents to the emergency department with acute abdominal pain and nausea. Patient was recently admitted to the hospital from 05/13/2016 to 05/16/2016 with a SBO that was felt to be secondary to tumor. The obstruction resolved spontaneously during the hospitalization and the patient was discharged home in much improved and stable condition. He reports occasional brief episodes of severe abdominal pain that resolved spontaneously within minutes, but developed severe acute pain in the lower abdomen at approximately 9 AM on the day of his admission and it persisted throughout the morning. Patient called his oncologist for advice and was directed by a physician assistant to the emergency department for evaluation. Patient describes his pain is severe, sharp, localized to the lower abdomen, associated with nausea, but no vomiting or diarrhea. The patient later vomited while in the emergency department. Patient reports having a small bowel movement earlier this morning before the onset of pain. He has not attempted any interventions for his symptoms prior to coming into the ED for evaluation. He describes his symptoms as being the same in location and character as his recent SBO. He denies fevers or chills, denies cough or dyspnea, and denies chest pain or palpitations.    ED Course: Upon arrival to the ED, patient is found to be afebrile, saturating well on room air, mildly hypertensive, and with vitals otherwise stable. Chemistry panel was unremarkable and CBC is notable only for a leukocytosis to 12,900. Urinalysis is also unremarkable. CT of the abdomen features  fluid-filled small bowel Little's with an obstruction suspected secondary to tumor. Also noted on CT are tiny omental nodules and nodules in the right lung base and right middle lobe which are consistent with metastatic disease. Patient was treated with morphine, Zofran, and normal saline infusion in the emergency department. He enjoyed subjective improvement in his symptoms with these measures, but continue step pain and abdominal distention and is not passing flatus. He'll be admitted to the medical-surgical unit for ongoing evaluation and management of recurrent SBO suspected secondary to tumor involvement.  Review of Systems:  All other systems reviewed and apart from HPI, are negative.  Past Medical History:  Diagnosis Date  . Arthritis   . Bowel obstruction   . 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 Diabetes mellitus without complication ([place])   . History of kidney stones 10-12 yrs ago  . Hypothyroidism   . pancreatic ca dx'd 2014  . Pneumonia   . Rocky Mountain spotted fever     Past Surgical History:  Procedure Laterality Date  . AMPUTATION Left 01/08/2016   Procedure: AMPUTATION REVISION LEFT FIRST FINGER ;  Surgeon: Charlotte Crumb, MD;  Location: [facility];  Service: Orthopedics;  Laterality: Left;  . EUS N/A 07/08/2013   Procedure: UPPER ENDOSCOPIC ULTRASOUND (EUS) LINEAR;  Surgeon: Milus Banister, MD;  Location: [facility];  Service: Endoscopy;  Laterality: N/A;  . I&D EXTREMITY Left 01/08/2016   Procedure: IRRIGATION AND DEBRIDEMENT LEFT THUMB;  Surgeon: Charlotte Crumb, MD;  Location: [place];  Service: Orthopedics;  Laterality: Left;  . PORTACATH PLACEMENT N/A 07/30/2013   Procedure: INSERTION PORT-A-CATH;  Surgeon: Stark Klein, MD;  Location: [facility];  Service: General;  Laterality: N/A;  . TONSILLECTOMY  as child     reports that he quit smoking about 10 years ago. His smoking use included Cigarettes. He started smoking about 60 years ago. He  has a 50.00 pack-year smoking history. He has never used smokeless tobacco. He reports that he does not drink alcohol or use drugs.  No Known Allergies  History reviewed. No pertinent family history.   Prior to Admission medications   Medication Sig Start Date End Date Taking? Authorizing Provider  gabapentin (NEURONTIN) 600 MG tablet Take 600 mg by mouth 2 (two) times daily. 01/10/16  Yes Historical Provider, MD  glipiZIDE (GLUCOTROL XL) 10 MG 24 hr tablet Take 10 mg by mouth daily with breakfast.  11/07/15  Yes Historical Provider, MD  HYDROcodone-acetaminophen (NORCO/VICODIN) 5-325 MG tablet Take 1-2 tablets every 4-6 hours as needed for pain Patient taking differently: Take 1-2 tablets by mouth every 4 (four) hours as needed for moderate pain or severe pain.  05/01/16  Yes Ladell Pier, MD  levothyroxine (SYNTHROID, LEVOTHROID) 150 MCG tablet Take 150 mcg by mouth every other day.    Yes Historical Provider, MD  omeprazole (PRILOSEC) 20 MG capsule Take 20 mg by mouth every morning.    Yes Historical Provider, MD  Tamsulosin HCl (FLOMAX) 0.4 MG CAPS Take 0.4 mg by mouth every morning.   Yes Historical Provider, MD  naproxen sodium (ANAPROX) 220 MG tablet Take 220 mg by mouth 2 (two) times daily as needed.    Historical Provider, MD  nitroGLYCERIN (NITROSTAT) 0.4 MG SL tablet Place 0.4 mg under the tongue as needed for chest pain.  03/09/15   Historical Provider, MD    Physical Exam: Vitals:   05/21/16 1524 05/21/16 1802  BP: 154/100 121/67  Pulse: 68 64  Resp: 18 18  Temp: 98.1 F (36.7 C)   TempSrc: Oral   SpO2: 97% 93%  Weight: 102.1 kg (225 lb)   Height: 5\' 10"  (1.778 m)       Constitutional: NAD, calm, comfortable Eyes: PERTLA, lids and conjunctivae normal ENMT: Mucous membranes are moist. Posterior pharynx clear of any exudate or lesions.   Neck: normal, supple, no masses, no thyromegaly Respiratory: clear to auscultation bilaterally, no wheezing, no crackles. Normal  respiratory effort.    Cardiovascular: S1 & S2 heard, regular rate and rhythm. No extremity edema. No significant JVD. Abdomen: Mild distension, mild tenderness across lower quadrants and periumbilically without rebound pain or guarding; bowel sounds are active.   Musculoskeletal: no clubbing / cyanosis. S/p left index finger amputation. No other joint deformity upper and lower extremities. Normal muscle tone.  Skin: no significant rashes, lesions, ulcers. Warm, dry, well-perfused. Neurologic: CN 2-12 grossly intact. Sensation intact, DTR normal. Strength 5/5 in all 4 limbs.  Psychiatric: Normal judgment and insight. Alert and oriented x 3. Normal mood and affect.     Labs on Admission: I have personally reviewed following labs and imaging studies  CBC:  Recent Labs Lab 05/21/16 1651  WBC 12.9*  HGB 15.7  HCT 45.4  MCV 94.8  PLT 123XX123   Basic Metabolic Panel:  Recent Labs Lab 05/21/16 1651  NA 140  K 3.5  CL 108  CO2 23  GLUCOSE 136*  BUN 13  CREATININE 0.86  CALCIUM 8.2*   GFR: Estimated Creatinine Clearance: 88.8 mL/min (by C-G formula based on SCr of 0.86 mg/dL). Liver Function Tests:  Recent Labs Lab 05/21/16 1651  AST 19  ALT 15*  ALKPHOS 110  BILITOT 0.7  PROT 7.0  ALBUMIN 4.0    Recent Labs Lab 05/21/16 1651  LIPASE 13   No results for input(s): AMMONIA in the last 168 hours. Coagulation Profile: No results for input(s): INR, PROTIME in the last 168 hours. Cardiac Enzymes: No results for input(s): CKTOTAL, CKMB, CKMBINDEX, TROPONINI in the last 168 hours. BNP (last 3 results) No results for input(s): PROBNP in the last 8760 hours. HbA1C: No results for input(s): HGBA1C in the last 72 hours. CBG:  Recent Labs Lab 05/15/16 2004 05/16/16 0004 05/16/16 0403 05/16/16 0759 05/16/16 1147  GLUCAP 182* 110* 105* 129* 173*   Lipid Profile: No results for input(s): CHOL, HDL, LDLCALC, TRIG, CHOLHDL, LDLDIRECT in the last 72 hours. Thyroid  Function Tests: No results for input(s): TSH, T4TOTAL, FREET4, T3FREE, THYROIDAB in the last 72 hours. Anemia Panel: No results for input(s): VITAMINB12, FOLATE, FERRITIN, TIBC, IRON, RETICCTPCT in the last 72 hours. Urine analysis:    Component Value Date/Time   COLORURINE YELLOW 05/21/2016 1701   APPEARANCEUR CLEAR 05/21/2016 1701   LABSPEC 1.017 05/21/2016 1701   PHURINE 6.0 05/21/2016 1701   GLUCOSEU NEGATIVE 05/21/2016 1701   HGBUR NEGATIVE 05/21/2016 1701   BILIRUBINUR NEGATIVE 05/21/2016 1701   KETONESUR NEGATIVE 05/21/2016 1701   PROTEINUR NEGATIVE 05/21/2016 1701   NITRITE NEGATIVE 05/21/2016 1701   LEUKOCYTESUR NEGATIVE 05/21/2016 1701   Sepsis Labs: @LABRCNTIP (procalcitonin:4,lacticidven:4) )No results found for this or any previous visit (from the past 240 hour(s)).   Radiological Exams on Admission: Ct Abdomen Pelvis W Contrast  Result Date: 05/21/2016 CLINICAL DATA:  75 year old diabetic male presenting with abdominal pain. Pancreatic cancer diagnosed 2014. Currently not on chemotherapy. Subsequent encounter. EXAM: CT ABDOMEN AND PELVIS WITH CONTRAST TECHNIQUE: Multidetector CT imaging of the abdomen and pelvis was performed using the standard protocol following bolus administration of intravenous contrast. CONTRAST:  158mL ISOVUE-300 IOPAMIDOL (ISOVUE-300) INJECTION 61% COMPARISON:  05/13/2016 CT. FINDINGS: Lower chest: Right lung base and right middle lobe nodules unchanged consistent with metastatic disease. Hepatobiliary: No mass.  No gallstones. Pancreas: Atrophic. Spleen: Slightly irregular without mass or enlargement. Adrenals/Urinary Tract: Bilateral renal cysts larger on the right. No hydronephrosis. No adrenal gland enlargement. Stomach/Bowel: Fluid-filled dilated small bowel loops. Tumor involvement suspected as cause of the small bowel obstruction rather than adhesions. Tiny omental nodules suggestive of metastatic disease. Vascular/Lymphatic: Mild calcified  plaque aorta with slight ectasia. No adenopathy. Reproductive: Top-normal size prostate gland. Other: Prominent fat inguinal canal. No free intraperitoneal air or drainable fluid collection. Musculoskeletal: Appearance of hemangiomas L1 and L2. Degenerative changes lower lumbar spine. IMPRESSION: Fluid-filled dilated small bowel loops. Tumor involvement suspected as cause of the small bowel obstruction rather than adhesions. Tiny omental nodules suggestive of metastatic disease. Right lung base and right middle lobe nodules suggestive of metastatic disease. Remainder of findings unchanged. Electronically Signed   By: Genia Del M.D.   On: 05/21/2016 18:01    EKG: Not performed, will obtain as appropriate.  Assessment/Plan  1. Recurrent SBO  - Was just discharged on 10/19 after SBO resolved with conservative management  - Recent SBO, and the current, are likely secondary to tumor based on CT-findings  - Bowel sounds remain active on admission; hopeful this will resolve with supportive care only, but he seems to be at high-risk for recurrences  - NGT defferred for now as no active N/V at time of admission   - Bowel rest, serial abd exams, IV hydration, prn anti-emtics and analgesia  2. Stage IV pancreatic cancer - Followed by Dr. Benay Spice of oncology and under consideration for salvage chemotherapy per recent notes  - He was diagnosed in December 2015 and had hypermetabolic nodules in omentum and lung at that time - Responded to chemo initially before CA-19 began to rise and metastatic nodules increased in size in recent months - Ongoing management per oncology   3. Type II DM  - No A1c on file  - Managed with glipizide at home, will hold this while in hospital  - Check CBG q4h and treat with low-intensity SSI as needed    4. Hypothyroidism  - Appears to be stable, will continue current-dose Synthroid   5. COPD - Stable, asymptomatic, no exacerbation  - DuoNeb available prn     DVT  prophylaxis: sq heparin Code Status: Full  Family Communication: Wife updated at bedside Disposition Plan: Admit to med-surg Consults called: Nont Admission status: Inpatient    Vianne Bulls, MD [facility] Pager [phone]  If 7PM-7AM, please contact night-coverage [email or web address] Password [email or web address]  05/21/2016, 6:59 PM

## 2016-05-21 NOTE — ED Triage Notes (Signed)
PT C/O ABDOMINAL PAIN WITH N/V SINCE 9 AM TODAY. PT STS THIS FEELS LIKE ANOTHER BOWEL BLOCKAGE. PT HAS A HX OF INTESTINAL CA, NOT CURRENTLY ON CHEMO/RADIATION. PT STS HHAD A BLOCKAGE A WEEK AGO.

## 2016-05-21 NOTE — Telephone Encounter (Signed)
Voice mail from wife that he was in hospital last week with "a blockage". It resolved without surgery. He is in a lot of pain today and was told by [facility] that she should call Dr. Benay Spice about this.  Forwarded voice mail to Production designer, theatre/television/film.

## 2016-05-21 NOTE — ED Notes (Signed)
Per Cancer center, pt was recently hospitalized for SBO-was on bowel rest-states currently having abdominal pain-told to come to ED to be worked up for possible SBO

## 2016-05-21 NOTE — Telephone Encounter (Signed)
Returned call to pt's wife, she reports "excruciating" pain recurred this morning after breakfast. Pt had a soft stool today. Reports nausea without emesis. He has not taken pain med, afraid he is developing an obstruction.  Reviewed with Ned Card, NP: Pt should go to ED for evaluation. Called wife with these instructions. Pt is now vomiting. She stated she will take him to [facility]. Notified ED Charge RN, wife is bringing pt from home.

## 2016-05-21 NOTE — ED Provider Notes (Signed)
[facility] DEPT Provider Note   CSN: [id number] Arrival date & time: 05/21/16  1506 By signing my name below, I, Albert Richards, attest that this documentation has been prepared under the direction and in the presence of [name], MD. Electronically Signed: Georgette Richards, ED Scribe. 05/21/16. 4:49 PM.  History   Chief Complaint Chief Complaint  Patient presents with  . Abdominal Pain   HPI Comments: Albert Richards is a 75 y.o. male with h/o metastatic cancer, bowel obstructions, kidney stones, and DM who presents to the Emergency Department complaining of cramping, [id number] periumbilical abdominal pain radiating to his back onset 9 am today. Pt also has associated nausea and vomiting. Pt was hospitalized last week for a SBO and he notes this feels the same. Pain is exacerbated with movement. He has not tried any OTC medications PTA. Pt states he has not passed gas but has had one normal bowel movement today. Pt has h/o metastatic cancer but reports that he is not on chemotherapy or radiation at this time. Pt denies h/o abdominal surgeries. He further denies fever or any other associated symptoms.   The history is provided by the patient. No language interpreter was used.    Past Medical History:  Diagnosis Date  . Arthritis   . Bowel obstruction   . 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 Diabetes mellitus without complication ([place])   . History of kidney stones 10-12 yrs ago  . Hypothyroidism   . pancreatic ca dx'd 2014  . Pneumonia   . Rocky Mountain spotted fever     Patient Active Problem List   Diagnosis Date Noted  . Nausea vomiting and diarrhea   . SBO (small bowel obstruction) 05/13/2016  . Diabetes mellitus ([place]) 05/13/2016  . Hypothyroid 05/13/2016  . Atherosclerosis of aorta ([place]) 07/07/2013  . Stage IV adenocarcinoma of pancreas ([place]) 06/30/2013  . COPD (chronic obstructive pulmonary disease) ([place]) 06/30/2013    Past Surgical History:   Procedure Laterality Date  . AMPUTATION Left 01/08/2016   Procedure: AMPUTATION REVISION LEFT FIRST FINGER ;  Surgeon: Charlotte Crumb, MD;  Location: [facility];  Service: Orthopedics;  Laterality: Left;  . EUS N/A 07/08/2013   Procedure: UPPER ENDOSCOPIC ULTRASOUND (EUS) LINEAR;  Surgeon: Milus Banister, MD;  Location: [facility];  Service: Endoscopy;  Laterality: N/A;  . I&D EXTREMITY Left 01/08/2016   Procedure: IRRIGATION AND DEBRIDEMENT LEFT THUMB;  Surgeon: Charlotte Crumb, MD;  Location: [facility];  Service: Orthopedics;  Laterality: Left;  . PORTACATH PLACEMENT N/A 07/30/2013   Procedure: INSERTION PORT-A-CATH;  Surgeon: Stark Klein, MD;  Location: [facility];  Service: General;  Laterality: N/A;  . TONSILLECTOMY  as child       Home Medications    Prior to Admission medications   Medication Sig Start Date End Date Taking? Authorizing Provider  gabapentin (NEURONTIN) 600 MG tablet Take 600 mg by mouth 2 (two) times daily. 01/10/16  Yes Historical Provider, MD  glipiZIDE (GLUCOTROL XL) 10 MG 24 hr tablet Take 10 mg by mouth daily with breakfast.  11/07/15  Yes Historical Provider, MD  HYDROcodone-acetaminophen (NORCO/VICODIN) 5-325 MG tablet Take 1-2 tablets every 4-6 hours as needed for pain Patient taking differently: Take 1-2 tablets by mouth every 4 (four) hours as needed for moderate pain or severe pain.  05/01/16  Yes Ladell Pier, MD  levothyroxine (SYNTHROID, LEVOTHROID) 150 MCG tablet Take 150 mcg by mouth every other day.    Yes Historical Provider,  MD  omeprazole (PRILOSEC) 20 MG capsule Take 20 mg by mouth every morning.    Yes Historical Provider, MD  Tamsulosin HCl (FLOMAX) 0.4 MG CAPS Take 0.4 mg by mouth every morning.   Yes Historical Provider, MD  naproxen sodium (ANAPROX) 220 MG tablet Take 220 mg by mouth 2 (two) times daily as needed.    Historical Provider, MD  nitroGLYCERIN (NITROSTAT) 0.4 MG SL tablet Place 0.4 mg under the tongue as needed for chest pain.  03/09/15    Historical Provider, MD    Family History History reviewed. No pertinent family history.  Social History Social History  Substance Use Topics  . Smoking status: Former Smoker    Packs/day: 1.00    Years: 50.00    Types: Cigarettes    Start date: 07/30/1955    Quit date: 07/29/2005  . Smokeless tobacco: Never Used  . Alcohol use No     Allergies   Review of patient's allergies indicates no known allergies.   Review of Systems Review of Systems  Constitutional: Negative for fever.  Gastrointestinal: Positive for abdominal pain, nausea and vomiting.  Musculoskeletal: Positive for back pain.  All other systems reviewed and are negative.    Physical Exam Updated Vital Signs BP 154/100 (BP Location: Right Arm)   Pulse 68   Temp 98.1 F (36.7 C) (Oral)   Resp 18   Ht 5\' 10"  (1.778 m)   Wt 225 lb (102.1 kg)   SpO2 97%   BMI 32.28 kg/m   Physical Exam  Constitutional: He is oriented to person, place, and time. Vital signs are normal. He appears well-developed and well-nourished.  Non-toxic appearance. He does not appear ill. No distress.  HENT:  Head: Normocephalic and atraumatic.  Nose: Nose normal.  Mouth/Throat: Oropharynx is clear and moist. No oropharyngeal exudate.  Eyes: Conjunctivae and EOM are normal. Pupils are equal, round, and reactive to light. No scleral icterus.  Neck: Normal range of motion. Neck supple. No tracheal deviation, no edema, no erythema and normal range of motion present. No thyroid mass and no thyromegaly present.  Cardiovascular: Normal rate, regular rhythm, S1 normal, S2 normal, normal heart sounds, intact distal pulses and normal pulses.  Exam reveals no gallop and no friction rub.   No murmur heard. Pulmonary/Chest: Effort normal and breath sounds normal. No respiratory distress. He has no wheezes. He has no rhonchi. He has no rales.  Port in left chest.  Abdominal: Soft. Normal appearance and bowel sounds are normal. He exhibits  distension. He exhibits no ascites and no mass. There is no hepatosplenomegaly. There is tenderness. There is no rebound, no guarding and no CVA tenderness.  Mild distension. Mild periumbilical tenderness.  Musculoskeletal: Normal range of motion. He exhibits no edema or tenderness.  Lymphadenopathy:    He has no cervical adenopathy.  Neurological: He is alert and oriented to person, place, and time. He has normal strength. No cranial nerve deficit or sensory deficit.  Skin: Skin is warm, dry and intact. No petechiae and no rash noted. He is not diaphoretic. No erythema. No pallor.  Nursing note and vitals reviewed.    ED Treatments / Results  DIAGNOSTIC STUDIES: Oxygen Saturation is 97% on RA, adequate by my interpretation.    COORDINATION OF CARE: 4:45 PM Discussed treatment plan with pt at bedside which includes IV fluids and abdomen CT and pt agreed to plan.  Labs (all labs ordered are listed, but only abnormal results are displayed) Labs Reviewed  COMPREHENSIVE  METABOLIC PANEL - Abnormal; Notable for the following:       Result Value   Glucose, Bld 136 (*)    Calcium 8.2 (*)    ALT 15 (*)    All other components within normal limits  CBC - Abnormal; Notable for the following:    WBC 12.9 (*)    All other components within normal limits  LIPASE, BLOOD  URINALYSIS, ROUTINE W REFLEX MICROSCOPIC (NOT AT [facility])    EKG  EKG Interpretation None       Radiology Ct Abdomen Pelvis W Contrast  Result Date: 05/21/2016 CLINICAL DATA:  75 year old diabetic male presenting with abdominal pain. Pancreatic cancer diagnosed 2014. Currently not on chemotherapy. Subsequent encounter. EXAM: CT ABDOMEN AND PELVIS WITH CONTRAST TECHNIQUE: Multidetector CT imaging of the abdomen and pelvis was performed using the standard protocol following bolus administration of intravenous contrast. CONTRAST:  180mL ISOVUE-300 IOPAMIDOL (ISOVUE-300) INJECTION 61% COMPARISON:  05/13/2016 CT. FINDINGS:  Lower chest: Right lung base and right middle lobe nodules unchanged consistent with metastatic disease. Hepatobiliary: No mass.  No gallstones. Pancreas: Atrophic. Spleen: Slightly irregular without mass or enlargement. Adrenals/Urinary Tract: Bilateral renal cysts larger on the right. No hydronephrosis. No adrenal gland enlargement. Stomach/Bowel: Fluid-filled dilated small bowel loops. Tumor involvement suspected as cause of the small bowel obstruction rather than adhesions. Tiny omental nodules suggestive of metastatic disease. Vascular/Lymphatic: Mild calcified plaque aorta with slight ectasia. No adenopathy. Reproductive: Top-normal size prostate gland. Other: Prominent fat inguinal canal. No free intraperitoneal air or drainable fluid collection. Musculoskeletal: Appearance of hemangiomas L1 and L2. Degenerative changes lower lumbar spine. IMPRESSION: Fluid-filled dilated small bowel loops. Tumor involvement suspected as cause of the small bowel obstruction rather than adhesions. Tiny omental nodules suggestive of metastatic disease. Right lung base and right middle lobe nodules suggestive of metastatic disease. Remainder of findings unchanged. Electronically Signed   By: Genia Del M.D.   On: 05/21/2016 18:01    Procedures Procedures (including critical care time)  Medications Ordered in ED Medications - No data to display   Initial Impression / Assessment and Plan / ED Course  I have reviewed the triage vital signs and the nursing notes.  Pertinent labs & imaging results that were available during my care of the patient were reviewed by me and considered in my medical decision making (see chart for details).  Clinical Course    74yM with abdominal pain and n/v. Again has SBO probably 2/2 mets. No active vomiting in ED. Responded to conservative management previously. NGT deferred at this time. Antiemetics. IVF. Admit.   Final Clinical Impressions(s) / ED Diagnoses   Final diagnoses:   SBO (small bowel obstruction)  Metastatic cancer ([place])    New Prescriptions New Prescriptions   No medications on file   I personally preformed the services scribed in my presence. The recorded information has been reviewed is accurate. [name], MD.     [name], MD 05/21/16 [phone]

## 2016-05-21 NOTE — ED Notes (Signed)
Patient family member called stating that a nurse was rude to them and needs patient to be seen promptly. Explained to them that I just received the patient and did not know anything about the situation.

## 2016-05-21 NOTE — ED Notes (Signed)
Report given to RN on 3W  

## 2016-05-22 DIAGNOSIS — J438 Other emphysema: Secondary | ICD-10-CM

## 2016-05-22 LAB — GLUCOSE, CAPILLARY
GLUCOSE-CAPILLARY: 105 mg/dL — AB (ref 65–99)
GLUCOSE-CAPILLARY: 117 mg/dL — AB (ref 65–99)
GLUCOSE-CAPILLARY: 95 mg/dL (ref 65–99)
Glucose-Capillary: 81 mg/dL (ref 65–99)
Glucose-Capillary: 91 mg/dL (ref 65–99)

## 2016-05-22 LAB — BASIC METABOLIC PANEL
ANION GAP: 4 — AB (ref 5–15)
BUN: 14 mg/dL (ref 6–20)
CALCIUM: 8.2 mg/dL — AB (ref 8.9–10.3)
CO2: 25 mmol/L (ref 22–32)
Chloride: 109 mmol/L (ref 101–111)
Creatinine, Ser: 0.99 mg/dL (ref 0.61–1.24)
GLUCOSE: 112 mg/dL — AB (ref 65–99)
POTASSIUM: 4 mmol/L (ref 3.5–5.1)
Sodium: 138 mmol/L (ref 135–145)

## 2016-05-22 LAB — MAGNESIUM: Magnesium: 2 mg/dL (ref 1.7–2.4)

## 2016-05-22 MED ORDER — SODIUM CHLORIDE 0.9 % IV SOLN
INTRAVENOUS | Status: DC
  Administered 2016-05-22 – 2016-05-24 (×4): via INTRAVENOUS

## 2016-05-22 NOTE — Consult Note (Signed)
[facility] Surgery Consult Note  Albert Richards 1940/09/16  [id number].    Requesting MD: Wyline Copas Chief Complaint/Reason for Consult: SBO  HPI:  Albert Richards is a 75yo male PMH DM, COPD, hypothyroidism, and stage IV pancreatic cancer who presented to the ED yesterday with acute onset abdominal pain and nausea. Patient was recently admitted to the hospital from 05/13/2016 to 05/16/2016 with a SBO that was felt to be secondary to progressive carcinomatosis; this resolved spontaneously. States that he did well until yesterday morning. He began having progressive lower abdominal pain similar to before. He also had associated nausea and vomiting. Denies diarrhea, melena, hematochezia, fever, or chills. Last BM was yesterday morning. Abdominal pain has resolved since admission and he has had no emesis since yesterday. He did pass a little flatus this morning.  PMH significant for DM, COPD, hypothyroidism, and stage IV pancreatic cancer No prior history of abdominal surgery Nonsmoker  ROS: All systems reviewed and otherwise negative except for as above  History reviewed. No pertinent family history.  Past Medical History:  Diagnosis Date  . Arthritis   . Bowel obstruction   . 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 Diabetes mellitus without complication ([place])   . History of kidney stones 10-12 yrs ago  . Hypothyroidism   . pancreatic ca dx'd 2014  . Pneumonia   . [place] spotted fever     Past Surgical History:  Procedure Laterality Date  . AMPUTATION Left 01/08/2016   Procedure: AMPUTATION REVISION LEFT FIRST FINGER ;  Surgeon: Charlotte Crumb, MD;  Location: [place];  Service: Orthopedics;  Laterality: Left;  . EUS N/A 07/08/2013   Procedure: UPPER ENDOSCOPIC ULTRASOUND (EUS) LINEAR;  Surgeon: Milus Banister, MD;  Location: [facility];  Service: Endoscopy;  Laterality: N/A;  . I&D EXTREMITY Left 01/08/2016   Procedure: IRRIGATION AND  DEBRIDEMENT LEFT THUMB;  Surgeon: Charlotte Crumb, MD;  Location: [place];  Service: Orthopedics;  Laterality: Left;  . PORTACATH PLACEMENT N/A 07/30/2013   Procedure: INSERTION PORT-A-CATH;  Surgeon: Stark Klein, MD;  Location: [place];  Service: General;  Laterality: N/A;  . TONSILLECTOMY  as child    Social History:  reports that he quit smoking about 10 years ago. His smoking use included Cigarettes. He started smoking about 60 years ago. He has a 50.00 pack-year smoking history. He has never used smokeless tobacco. He reports that he does not drink alcohol or use drugs.  Allergies: No Known Allergies  Medications Prior to Admission  Medication Sig Dispense Refill  . gabapentin (NEURONTIN) 600 MG tablet Take 600 mg by mouth 2 (two) times daily.  1  . glipiZIDE (GLUCOTROL XL) 10 MG 24 hr tablet Take 10 mg by mouth daily with breakfast.   0  . HYDROcodone-acetaminophen (NORCO/VICODIN) 5-325 MG tablet Take 1-2 tablets every 4-6 hours as needed for pain (Patient taking differently: Take 1-2 tablets by mouth every 4 (four) hours as needed for moderate pain or severe pain. ) 50 tablet 0  . levothyroxine (SYNTHROID, LEVOTHROID) 150 MCG tablet Take 150 mcg by mouth every other day.     Marland Kitchen omeprazole (PRILOSEC) 20 MG capsule Take 20 mg by mouth every morning.     . Tamsulosin HCl (FLOMAX) 0.4 MG CAPS Take 0.4 mg by mouth every morning.    . naproxen sodium (ANAPROX) 220 MG tablet Take 220 mg by mouth 2 (two) times daily as needed.    . nitroGLYCERIN (NITROSTAT) 0.4 MG  SL tablet Place 0.4 mg under the tongue as needed for chest pain.       Blood pressure 138/70, pulse 64, temperature 98.1 F (36.7 C), temperature source Oral, resp. rate 18, height '5\' 10"'  (1.778 m), weight 215 lb (97.5 kg), SpO2 93 %. Physical Exam: General: pleasant, WD/WN white male who is laying in bed in NAD HEENT: head is normocephalic, atraumatic.  Mouth is pink and moist Heart: regular, rate, and rhythm.  No obvious murmurs,  gallops, or rubs noted.  Palpable pedal pulses bilaterally Lungs: CTAB, no wheezes, rhonchi, or rales noted.  Respiratory effort nonlabored Abd: port in place L chest; abdomen soft, NT, minimally distended, +BS, no masses, hernias, or organomegaly MS: all 4 extremities are symmetrical with no cyanosis, clubbing, or edema. Skin: warm and dry with no masses, lesions, or rashes Psych: A&Ox3 with an appropriate affect.  Results for orders placed or performed during the hospital encounter of 05/21/16 (from the past 48 hour(s))  Lipase, blood     Status: None   Collection Time: 05/21/16  4:51 PM  Result Value Ref Range   Lipase 13 11 - 51 U/L  Comprehensive metabolic panel     Status: Abnormal   Collection Time: 05/21/16  4:51 PM  Result Value Ref Range   Sodium 140 135 - 145 mmol/L   Potassium 3.5 3.5 - 5.1 mmol/L   Chloride 108 101 - 111 mmol/L   CO2 23 22 - 32 mmol/L   Glucose, Bld 136 (H) 65 - 99 mg/dL   BUN 13 6 - 20 mg/dL   Creatinine, Ser 0.86 0.61 - 1.24 mg/dL   Calcium 8.2 (L) 8.9 - 10.3 mg/dL   Total Protein 7.0 6.5 - 8.1 g/dL   Albumin 4.0 3.5 - 5.0 g/dL   AST 19 15 - 41 U/L   ALT 15 (L) 17 - 63 U/L   Alkaline Phosphatase 110 38 - 126 U/L   Total Bilirubin 0.7 0.3 - 1.2 mg/dL   GFR calc non Af Amer >60 >60 mL/min   GFR calc Af Amer >60 >60 mL/min    Comment: (NOTE) The eGFR has been calculated using the CKD EPI equation. This calculation has not been validated in all clinical situations. eGFR's persistently <60 mL/min signify possible Chronic Kidney Disease.    Anion gap 9 5 - 15  CBC     Status: Abnormal   Collection Time: 05/21/16  4:51 PM  Result Value Ref Range   WBC 12.9 (H) 4.0 - 10.5 K/uL   RBC 4.79 4.22 - 5.81 MIL/uL   Hemoglobin 15.7 13.0 - 17.0 g/dL   HCT 45.4 39.0 - 52.0 %   MCV 94.8 78.0 - 100.0 fL   MCH 32.8 26.0 - 34.0 pg   MCHC 34.6 30.0 - 36.0 g/dL   RDW 13.8 11.5 - 15.5 %   Platelets 216 150 - 400 K/uL  Urinalysis, Routine w reflex microscopic      Status: None   Collection Time: 05/21/16  5:01 PM  Result Value Ref Range   Color, Urine YELLOW YELLOW   APPearance CLEAR CLEAR   Specific Gravity, Urine 1.017 1.005 - 1.030   pH 6.0 5.0 - 8.0   Glucose, UA NEGATIVE NEGATIVE mg/dL   Hgb urine dipstick NEGATIVE NEGATIVE   Bilirubin Urine NEGATIVE NEGATIVE   Ketones, ur NEGATIVE NEGATIVE mg/dL   Protein, ur NEGATIVE NEGATIVE mg/dL   Nitrite NEGATIVE NEGATIVE   Leukocytes, UA NEGATIVE NEGATIVE    Comment: MICROSCOPIC NOT  DONE ON URINES WITH NEGATIVE PROTEIN, BLOOD, LEUKOCYTES, NITRITE, OR GLUCOSE <1000 mg/dL.  Glucose, capillary     Status: Abnormal   Collection Time: 05/21/16  8:17 PM  Result Value Ref Range   Glucose-Capillary 109 (H) 65 - 99 mg/dL   Comment 1 Notify RN   Glucose, capillary     Status: None   Collection Time: 05/22/16 12:00 AM  Result Value Ref Range   Glucose-Capillary 90 65 - 99 mg/dL   Comment 1 Notify RN   Glucose, capillary     Status: Abnormal   Collection Time: 05/22/16  4:07 AM  Result Value Ref Range   Glucose-Capillary 105 (H) 65 - 99 mg/dL   Comment 1 Notify RN   Basic metabolic panel     Status: Abnormal   Collection Time: 05/22/16  6:28 AM  Result Value Ref Range   Sodium 138 135 - 145 mmol/L   Potassium 4.0 3.5 - 5.1 mmol/L   Chloride 109 101 - 111 mmol/L   CO2 25 22 - 32 mmol/L   Glucose, Bld 112 (H) 65 - 99 mg/dL   BUN 14 6 - 20 mg/dL   Creatinine, Ser 0.99 0.61 - 1.24 mg/dL   Calcium 8.2 (L) 8.9 - 10.3 mg/dL   GFR calc non Af Amer >60 >60 mL/min   GFR calc Af Amer >60 >60 mL/min    Comment: (NOTE) The eGFR has been calculated using the CKD EPI equation. This calculation has not been validated in all clinical situations. eGFR's persistently <60 mL/min signify possible Chronic Kidney Disease.    Anion gap 4 (L) 5 - 15  Magnesium     Status: None   Collection Time: 05/22/16  6:28 AM  Result Value Ref Range   Magnesium 2.0 1.7 - 2.4 mg/dL  Glucose, capillary     Status: Abnormal    Collection Time: 05/22/16  8:31 AM  Result Value Ref Range   Glucose-Capillary 117 (H) 65 - 99 mg/dL   Ct Abdomen Pelvis W Contrast  Result Date: 05/21/2016 CLINICAL DATA:  75 year old diabetic male presenting with abdominal pain. Pancreatic cancer diagnosed 2014. Currently not on chemotherapy. Subsequent encounter. EXAM: CT ABDOMEN AND PELVIS WITH CONTRAST TECHNIQUE: Multidetector CT imaging of the abdomen and pelvis was performed using the standard protocol following bolus administration of intravenous contrast. CONTRAST:  11m ISOVUE-300 IOPAMIDOL (ISOVUE-300) INJECTION 61% COMPARISON:  05/13/2016 CT. FINDINGS: Lower chest: Right lung base and right middle lobe nodules unchanged consistent with metastatic disease. Hepatobiliary: No mass.  No gallstones. Pancreas: Atrophic. Spleen: Slightly irregular without mass or enlargement. Adrenals/Urinary Tract: Bilateral renal cysts larger on the right. No hydronephrosis. No adrenal gland enlargement. Stomach/Bowel: Fluid-filled dilated small bowel loops. Tumor involvement suspected as cause of the small bowel obstruction rather than adhesions. Tiny omental nodules suggestive of metastatic disease. Vascular/Lymphatic: Mild calcified plaque aorta with slight ectasia. No adenopathy. Reproductive: Top-normal size prostate gland. Other: Prominent fat inguinal canal. No free intraperitoneal air or drainable fluid collection. Musculoskeletal: Appearance of hemangiomas L1 and L2. Degenerative changes lower lumbar spine. IMPRESSION: Fluid-filled dilated small bowel loops. Tumor involvement suspected as cause of the small bowel obstruction rather than adhesions. Tiny omental nodules suggestive of metastatic disease. Right lung base and right middle lobe nodules suggestive of metastatic disease. Remainder of findings unchanged. Electronically Signed   By: SGenia DelM.D.   On: 05/21/2016 18:01      Assessment/Plan Recurrent SBO - just discharged 05/16/16 after SBO  that was managed medically - CT  scan this admission shows fluid-filled dilated small bowel loops, tumor involvement suspected as cause of the small bowel obstruction rather than adhesions; tiny omental nodules suggestive of metastatic disease. - per oncology obstruction is felt to likely be secondary to progressive carcinomatosis, but chemotherapy will likely not fix his SBO - since admission patient's pain and emesis have resolved, he did pass a small amount of flatus this morning  Stage IV Pancreatic cancer - Followed by Dr. Benay Spice of oncology and under consideration for repeat treatment with gemcitabine/Abraxane or FOLFIRINOX. Has been maintained off specific therapy since December 2016 - He was diagnosed in December 2015 and had hypermetabolic nodules in omentum and lung at that time - Responded to chemo initially before CA-19 began to rise and metastatic nodules increased in size in recent months - Ongoing management per oncology  DM COPD Hypothyroidism   VTE - heparin FEN - NPO, IVF  Plan - SBO may be resolving as patient had small amount of flatus this morning. Agree with continuing NPO, bowel rest, IVF. If he continues to improve could consider advancing diet to clear liquids tomorrow. Patient and wife concerned with how quickly this recurred, and afraid if they go home it will happen again.  Jerrye Beavers, [facility] Surgery 05/22/2016, 10:58 AM Pager: [phone] Consults: [phone] Mon-Fri 7:00 am-4:30 pm Sat-Sun 7:00 am-11:30 am

## 2016-05-22 NOTE — Progress Notes (Signed)
PROGRESS NOTE    Albert Richards  [name] DOB: [birth year] DOA: 05/21/2016 PCP: [name], MD    Brief Narrative:  75 y.o. male with medical history significant for type 2 diabetes mellitus, hypothyroidism, and stage IV pancreatic cancer who presents to the emergency department with acute abdominal pain and nausea. Patient was recently admitted to the hospital from 05/13/2016 to 05/16/2016 with a SBO that was felt to be secondary to tumor. The obstruction resolved spontaneously during the hospitalization and the patient was discharged home in much improved and stable condition. He reports occasional brief episodes of severe abdominal pain that resolved spontaneously within minutes, but developed severe acute pain in the lower abdomen at approximately 9 AM on the day of his admission and it persisted throughout the morning. Patient called his oncologist for advice and was directed by a physician assistant to the emergency department for evaluation. Patient describes his pain is severe, sharp, localized to the lower abdomen, associated with nausea, but no vomiting or diarrhea. The patient later vomited while in the emergency department. Patient reports having a small bowel movement earlier this morning before the onset of pain. He has not attempted any interventions for his symptoms prior to coming into the ED for evaluation. He describes his symptoms as being the same in location and character as his recent SBO. He denies fevers or chills, denies cough or dyspnea, and denies chest pain or palpitations.    Assessment & Plan:   Principal Problem:   SBO (small bowel obstruction) Active Problems:   Stage IV adenocarcinoma of pancreas (HCC)   COPD (chronic obstructive pulmonary disease) (HCC)   Diabetes mellitus (HCC)   Hypothyroid   1. Recurrent SBO  -Patient recently discharged for small bowel obstruction -Patient does report some flatus this morning -Decreased bowel sounds on my  exam -CT scan reviewed, patient does have evidence of small bowel obstruction that is likely secondary to known tumor. -Will form patient's oncologist of patient's hospital admission -Have consulted and discussed with general surgery. Appreciate input. -For now, continue nothing by mouth with supportive care. We'll follow-up on surgery recommendations  2. Stage IV pancreatic cancer -Patient is followed by oncology as an outpatient. -Per above, will notify oncology of patient's hospital admission - Followed by Dr. Benay Spice of oncology and under consideration for salvage chemotherapy per recent notes   3. Type II DM  -Continue sliding-scale insulin as tolerated -Glucose stable thus far  4. Hypothyroidism  -We'll continue thyroid replacement as tolerated  5. COPD -Normal wheezing on exam -We'll continue duo nebs on an as-needed basis  6. Leukocytosis -Labs reviewed this morning -Doubt active infection -We'll repeat CBC in the morning   DVT prophylaxis: Heparin subcutaneous Code Status: Full code Family Communication: Patient in room, family at bedside Disposition Plan: Uncertain at this time  Consultants:   General surgery  Procedures:     Antimicrobials: Anti-infectives    None       Subjective: Patient without complaints this morning. Reports some flatus  Objective: Vitals:   05/21/16 1524 05/21/16 1802 05/21/16 2014 05/22/16 0410  BP: 154/100 121/67 (!) 141/97 138/70  Pulse: 68 64 60 64  Resp: 18 18 18 18   Temp: 98.1 F (36.7 C)  97.5 F (36.4 C) 98.1 F (36.7 C)  TempSrc: Oral  Oral Oral  SpO2: 97% 93% 97% 93%  Weight: 102.1 kg (225 lb)  97.5 kg (215 lb) 97.5 kg (215 lb)  Height: 5\' 10"  (1.778 m)  5\' 10"  (1.778 m)  Intake/Output Summary (Last 24 hours) at 05/22/16 1052 Last data filed at 05/22/16 0500  Gross per 24 hour  Intake              900 ml  Output              200 ml  Net              700 ml   Filed Weights   05/21/16 1524  05/21/16 2014 05/22/16 0410  Weight: 102.1 kg (225 lb) 97.5 kg (215 lb) 97.5 kg (215 lb)    Examination:  General exam: Appears calm and comfortable  Respiratory system: Clear to auscultation. Respiratory effort normal. Cardiovascular system: S1 & S2 heard, RRR.  Gastrointestinal system: Abdomen is nondistended, decreased bowel sounds Central nervous system: Alert and oriented. No focal neurological deficits. Extremities: Symmetric 5 x 5 power. Skin: No rashes, lesions or ulcers Psychiatry: Judgement and insight appear normal. Mood & affect appropriate.   Data Reviewed: I have personally reviewed following labs and imaging studies  CBC:  Recent Labs Lab 05/21/16 1651  WBC 12.9*  HGB 15.7  HCT 45.4  MCV 94.8  PLT 123XX123   Basic Metabolic Panel:  Recent Labs Lab 05/21/16 1651 05/22/16 0628  NA 140 138  K 3.5 4.0  CL 108 109  CO2 23 25  GLUCOSE 136* 112*  BUN 13 14  CREATININE 0.86 0.99  CALCIUM 8.2* 8.2*  MG  --  2.0   GFR: Estimated Creatinine Clearance: 75.5 mL/min (by C-G formula based on SCr of 0.99 mg/dL). Liver Function Tests:  Recent Labs Lab 05/21/16 1651  AST 19  ALT 15*  ALKPHOS 110  BILITOT 0.7  PROT 7.0  ALBUMIN 4.0    Recent Labs Lab 05/21/16 1651  LIPASE 13   No results for input(s): AMMONIA in the last 168 hours. Coagulation Profile: No results for input(s): INR, PROTIME in the last 168 hours. Cardiac Enzymes: No results for input(s): CKTOTAL, CKMB, CKMBINDEX, TROPONINI in the last 168 hours. BNP (last 3 results) No results for input(s): PROBNP in the last 8760 hours. HbA1C: No results for input(s): HGBA1C in the last 72 hours. CBG:  Recent Labs Lab 05/16/16 1147 05/21/16 2017 05/22/16 0000 05/22/16 0407 05/22/16 0831  GLUCAP 173* 109* 90 105* 117*   Lipid Profile: No results for input(s): CHOL, HDL, LDLCALC, TRIG, CHOLHDL, LDLDIRECT in the last 72 hours. Thyroid Function Tests: No results for input(s): TSH, T4TOTAL,  FREET4, T3FREE, THYROIDAB in the last 72 hours. Anemia Panel: No results for input(s): VITAMINB12, FOLATE, FERRITIN, TIBC, IRON, RETICCTPCT in the last 72 hours. Sepsis Labs: No results for input(s): PROCALCITON, LATICACIDVEN in the last 168 hours.  No results found for this or any previous visit (from the past 240 hour(s)).   Radiology Studies: Ct Abdomen Pelvis W Contrast  Result Date: 05/21/2016 CLINICAL DATA:  75 year old diabetic male presenting with abdominal pain. Pancreatic cancer diagnosed 2014. Currently not on chemotherapy. Subsequent encounter. EXAM: CT ABDOMEN AND PELVIS WITH CONTRAST TECHNIQUE: Multidetector CT imaging of the abdomen and pelvis was performed using the standard protocol following bolus administration of intravenous contrast. CONTRAST:  174mL ISOVUE-300 IOPAMIDOL (ISOVUE-300) INJECTION 61% COMPARISON:  05/13/2016 CT. FINDINGS: Lower chest: Right lung base and right middle lobe nodules unchanged consistent with metastatic disease. Hepatobiliary: No mass.  No gallstones. Pancreas: Atrophic. Spleen: Slightly irregular without mass or enlargement. Adrenals/Urinary Tract: Bilateral renal cysts larger on the right. No hydronephrosis. No adrenal gland enlargement. Stomach/Bowel: Fluid-filled dilated small bowel  loops. Tumor involvement suspected as cause of the small bowel obstruction rather than adhesions. Tiny omental nodules suggestive of metastatic disease. Vascular/Lymphatic: Mild calcified plaque aorta with slight ectasia. No adenopathy. Reproductive: Top-normal size prostate gland. Other: Prominent fat inguinal canal. No free intraperitoneal air or drainable fluid collection. Musculoskeletal: Appearance of hemangiomas L1 and L2. Degenerative changes lower lumbar spine. IMPRESSION: Fluid-filled dilated small bowel loops. Tumor involvement suspected as cause of the small bowel obstruction rather than adhesions. Tiny omental nodules suggestive of metastatic disease. Right lung  base and right middle lobe nodules suggestive of metastatic disease. Remainder of findings unchanged. Electronically Signed   By: Genia Del M.D.   On: 05/21/2016 18:01    Scheduled Meds: . famotidine (PEPCID) IV  20 mg Intravenous Q12H  . gabapentin  600 mg Oral BID  . heparin  5,000 Units Subcutaneous Q8H  . insulin aspart  0-9 Units Subcutaneous Q4H  . [START ON 05/23/2016] levothyroxine  150 mcg Oral QODAY  . tamsulosin  0.4 mg Oral Daily   Continuous Infusions: . sodium chloride       LOS: 1 day   CHIU, Orpah Melter, MD [facility] Pager [phone]  If 7PM-7AM, please contact night-coverage [email or web address] Password [email or web address] 05/22/2016, 10:52 AM

## 2016-05-23 ENCOUNTER — Other Ambulatory Visit: Payer: Self-pay | Admitting: Oncology

## 2016-05-23 DIAGNOSIS — K56609 Unspecified intestinal obstruction, unspecified as to partial versus complete obstruction: Secondary | ICD-10-CM

## 2016-05-23 DIAGNOSIS — C259 Malignant neoplasm of pancreas, unspecified: Principal | ICD-10-CM

## 2016-05-23 DIAGNOSIS — E039 Hypothyroidism, unspecified: Secondary | ICD-10-CM

## 2016-05-23 DIAGNOSIS — E119 Type 2 diabetes mellitus without complications: Secondary | ICD-10-CM

## 2016-05-23 DIAGNOSIS — C799 Secondary malignant neoplasm of unspecified site: Secondary | ICD-10-CM

## 2016-05-23 DIAGNOSIS — J449 Chronic obstructive pulmonary disease, unspecified: Secondary | ICD-10-CM

## 2016-05-23 LAB — GLUCOSE, CAPILLARY
GLUCOSE-CAPILLARY: 137 mg/dL — AB (ref 65–99)
GLUCOSE-CAPILLARY: 84 mg/dL (ref 65–99)
GLUCOSE-CAPILLARY: 87 mg/dL (ref 65–99)
Glucose-Capillary: 181 mg/dL — ABNORMAL HIGH (ref 65–99)
Glucose-Capillary: 198 mg/dL — ABNORMAL HIGH (ref 65–99)
Glucose-Capillary: 78 mg/dL (ref 65–99)
Glucose-Capillary: 93 mg/dL (ref 65–99)

## 2016-05-23 LAB — CBC
HEMATOCRIT: 42.7 % (ref 39.0–52.0)
HEMOGLOBIN: 14.2 g/dL (ref 13.0–17.0)
MCH: 32.1 pg (ref 26.0–34.0)
MCHC: 33.3 g/dL (ref 30.0–36.0)
MCV: 96.6 fL (ref 78.0–100.0)
Platelets: 227 10*3/uL (ref 150–400)
RBC: 4.42 MIL/uL (ref 4.22–5.81)
RDW: 13.8 % (ref 11.5–15.5)
WBC: 7.9 10*3/uL (ref 4.0–10.5)

## 2016-05-23 LAB — HEMOGLOBIN A1C
Hgb A1c MFr Bld: 7.3 % — ABNORMAL HIGH (ref 4.8–5.6)
MEAN PLASMA GLUCOSE: 163 mg/dL

## 2016-05-23 NOTE — Progress Notes (Signed)
Patient ID: Albert Richards, male   DOB: Feb 01, 1941, 75 y.o.   MRN: [id number]  [facility] Surgery Progress Note     Subjective: Denies any abdominal pain, nausea, or vomiting. Started on clears this morning but has not taken anything PO yet. Small amount of flatus, no BM  Objective: Vital signs in last 24 hours: Temp:  [97.4 F (36.3 C)-98.2 F (36.8 C)] 97.4 F (36.3 C) (10/26 0436) Pulse Rate:  [52-57] 52 (10/26 0436) Resp:  [17-18] 17 (10/26 0436) BP: (107-138)/(67-80) 107/72 (10/26 0436) SpO2:  [95 %-97 %] 97 % (10/26 0436) Weight:  [215 lb (97.5 kg)] 215 lb (97.5 kg) (10/26 0436) Last BM Date: 05/21/16  Intake/Output from previous day: 10/25 0701 - 10/26 0700 In: 1711.7 [I.V.:1611.7; IV Piggyback:100] Out: 500 [Urine:500] Intake/Output this shift: No intake/output data recorded.  PE: Gen:  Alert, NAD, pleasant Pulm:  Effort normal Abd: Soft, mild distension, NT, +BS  Lab Results:   Recent Labs  05/21/16 1651 05/23/16 0500  WBC 12.9* 7.9  HGB 15.7 14.2  HCT 45.4 42.7  PLT 216 227   BMET  Recent Labs  05/21/16 1651 05/22/16 0628  NA 140 138  K 3.5 4.0  CL 108 109  CO2 23 25  GLUCOSE 136* 112*  BUN 13 14  CREATININE 0.86 0.99  CALCIUM 8.2* 8.2*   PT/INR No results for input(s): LABPROT, INR in the last 72 hours. CMP     Component Value Date/Time   NA 138 05/22/2016 0628   NA 140 04/08/2016 1112   K 4.0 05/22/2016 0628   K 4.3 04/08/2016 1112   CL 109 05/22/2016 0628   CO2 25 05/22/2016 0628   CO2 26 04/08/2016 1112   GLUCOSE 112 (H) 05/22/2016 0628   GLUCOSE 137 04/08/2016 1112   BUN 14 05/22/2016 0628   BUN 16.8 04/08/2016 1112   CREATININE 0.99 05/22/2016 0628   CREATININE 1.1 04/08/2016 1112   CALCIUM 8.2 (L) 05/22/2016 0628   CALCIUM 9.3 04/08/2016 1112   PROT 7.0 05/21/2016 1651   PROT 6.7 07/20/2013 1432   ALBUMIN 4.0 05/21/2016 1651   ALBUMIN 3.4 (L) 07/20/2013 1432   AST 19 05/21/2016 1651   AST 15 07/20/2013 1432    ALT 15 (L) 05/21/2016 1651   ALT 15 07/20/2013 1432   ALKPHOS 110 05/21/2016 1651   ALKPHOS 81 07/20/2013 1432   BILITOT 0.7 05/21/2016 1651   BILITOT 0.40 07/20/2013 1432   GFRNONAA >60 05/22/2016 0628   GFRAA >60 05/22/2016 0628   Lipase     Component Value Date/Time   LIPASE 13 05/21/2016 1651       Studies/Results: Ct Abdomen Pelvis W Contrast  Result Date: 05/21/2016 CLINICAL DATA:  75 year old diabetic male presenting with abdominal pain. Pancreatic cancer diagnosed 2014. Currently not on chemotherapy. Subsequent encounter. EXAM: CT ABDOMEN AND PELVIS WITH CONTRAST TECHNIQUE: Multidetector CT imaging of the abdomen and pelvis was performed using the standard protocol following bolus administration of intravenous contrast. CONTRAST:  139mL ISOVUE-300 IOPAMIDOL (ISOVUE-300) INJECTION 61% COMPARISON:  05/13/2016 CT. FINDINGS: Lower chest: Right lung base and right middle lobe nodules unchanged consistent with metastatic disease. Hepatobiliary: No mass.  No gallstones. Pancreas: Atrophic. Spleen: Slightly irregular without mass or enlargement. Adrenals/Urinary Tract: Bilateral renal cysts larger on the right. No hydronephrosis. No adrenal gland enlargement. Stomach/Bowel: Fluid-filled dilated small bowel loops. Tumor involvement suspected as cause of the small bowel obstruction rather than adhesions. Tiny omental nodules suggestive of metastatic disease. Vascular/Lymphatic: Mild calcified plaque  aorta with slight ectasia. No adenopathy. Reproductive: Top-normal size prostate gland. Other: Prominent fat inguinal canal. No free intraperitoneal air or drainable fluid collection. Musculoskeletal: Appearance of hemangiomas L1 and L2. Degenerative changes lower lumbar spine. IMPRESSION: Fluid-filled dilated small bowel loops. Tumor involvement suspected as cause of the small bowel obstruction rather than adhesions. Tiny omental nodules suggestive of metastatic disease. Right lung base and right  middle lobe nodules suggestive of metastatic disease. Remainder of findings unchanged. Electronically Signed   By: Genia Del M.D.   On: 05/21/2016 18:01    Anti-infectives: Anti-infectives    None       Assessment/Plan Recurrent SBO - just discharged 05/16/16 after SBO that was managed medically - CT scan this admission shows fluid-filled dilated small bowel loops, tumor involvement suspected as cause of the small bowel obstruction rather than adhesions; tiny omental nodules suggestive of metastatic disease. - per oncology obstruction is felt to likely be secondary to progressive carcinomatosis, but chemotherapy will likely not fix his SBO - +flatus, no BM. SBO resolving. Starting clears this morning  Stage IV Pancreatic cancer - Followed by Dr. Benay Spice of oncology and under consideration for repeat treatment with gemcitabine/Abraxane or FOLFIRINOX. Has been maintained off specific therapy since December 2016 - He was diagnosed in December 2015 and had hypermetabolic nodules in omentum and lung at that time - Responded to chemo initially before CA-19 began to rise and metastatic nodules increased in size in recent months - Ongoing management per oncology  DM COPD Hypothyroidism   VTE - heparin FEN - NPO, IVF  Plan - +flatus. Starting clears this morning. Continue mobilization. Plan to discuss treatment plans with oncology. May benefit from oncology consult while in hospital.   LOS: 2 days    Jerrye Beavers , [facility] Surgery 05/23/2016, 10:00 AM Pager: [phone] Consults: [phone] Mon-Fri 7:00 am-4:30 pm Sat-Sun 7:00 am-11:30 am

## 2016-05-23 NOTE — Progress Notes (Signed)
Albert Richards   DOB:1941-06-01   FY#:[id number]   ENI#:[id number]  ONCOLOGY FOLLOW UP  Subjective: Pt's primary oncologist Dr. Learta Codding is out of office this week, and I am covering him to see pt. Chart and scan reviewed. Pt's abdominal pain has resolved, no nausea, he is on clear liquids now. He feels better overall. I met his wife and brother also in the room.   Objective:  Vitals:   05/23/16 0436 05/23/16 1311  BP: 107/72 132/86  Pulse: (!) 52 (!) 56  Resp: 17 16  Temp: 97.4 F (36.3 C) 98.1 F (36.7 C)    Body mass index is 30.85 kg/m.  Intake/Output Summary (Last 24 hours) at 05/23/16 1925 Last data filed at 05/23/16 1808  Gross per 24 hour  Intake          2821.66 ml  Output              500 ml  Net          2321.66 ml     Sclerae unicteric  Oropharynx clear  No peripheral adenopathy  Lungs clear -- no rales or rhonchi  Heart regular rate and rhythm  Abdomen benign, soft  MSK no focal spinal tenderness, no peripheral edema  Neuro nonfocal   CBG (last 3)   Recent Labs  05/23/16 0818 05/23/16 1141 05/23/16 1601  GLUCAP 93 198* 137*     Labs:  Lab Results  Component Value Date   WBC 7.9 05/23/2016   HGB 14.2 05/23/2016   HCT 42.7 05/23/2016   MCV 96.6 05/23/2016   PLT 227 05/23/2016   NEUTROABS 5.5 01/08/2016   CMP Latest Ref Rng & Units 05/22/2016 05/21/2016 05/14/2016  Glucose 65 - 99 mg/dL 112(H) 136(H) 130(H)  BUN 6 - 20 mg/dL 14 13 25(H)  Creatinine 0.61 - 1.24 mg/dL 0.99 0.86 1.15  Sodium 135 - 145 mmol/L 138 140 140  Potassium 3.5 - 5.1 mmol/L 4.0 3.5 4.2  Chloride 101 - 111 mmol/L 109 108 109  CO2 22 - 32 mmol/L _0 Calcium 8.9 - 10.3 mg/dL 8.2(L) 8.2(L) 8.3(L)  Total Protein 6.5 - 8.1 g/dL - 7.0 -  Total Bilirubin 0.3 - 1.2 mg/dL - 0.7 -  Alkaline Phos 38 - 126 U/L - 110 -  AST 15 - 41 U/L - 19 -  ALT 17 - 63 U/L - 15(L) -     Urine Studies No results for input(s): UHGB, CRYS in the last 72 hours.  Invalid input(s):  UACOL, UAPR, USPG, UPH, UTP, UGL, UKET, UBIL, UNIT, UROB, ULEU, UEPI, UWBC, URBC, UBAC, CAST, UCOM, BILUA  Basic Metabolic Panel:  Recent Labs Lab 05/21/16 1651 05/22/16 0628  NA 140 138  K 3.5 4.0  CL 108 109  CO2 23 25  GLUCOSE 136* 112*  BUN 13 14  CREATININE 0.86 0.99  CALCIUM 8.2* 8.2*  MG  --  2.0   GFR Estimated Creatinine Clearance: 75.5 mL/min (by C-G formula based on SCr of 0.99 mg/dL). Liver Function Tests:  Recent Labs Lab 05/21/16 1651  AST 19  ALT 15*  ALKPHOS 110  BILITOT 0.7  PROT 7.0  ALBUMIN 4.0    Recent Labs Lab 05/21/16 1651  LIPASE 13   No results for input(s): AMMONIA in the last 168 hours. Coagulation profile No results for input(s): INR, PROTIME in the last 168 hours.  CBC:  Recent Labs Lab 05/21/16 1651 05/23/16 0500  WBC 12.9* 7.9  HGB 15.7 14.2  HCT 45.4 42.7  MCV 94.8 96.6  PLT 216 227   Cardiac Enzymes: No results for input(s): CKTOTAL, CKMB, CKMBINDEX, TROPONINI in the last 168 hours. BNP: Invalid input(s): POCBNP CBG:  Recent Labs Lab 05/23/16 0006 05/23/16 0418 05/23/16 0818 05/23/16 1141 05/23/16 1601  GLUCAP 78 84 93 198* 137*   D-Dimer No results for input(s): DDIMER in the last 72 hours. Hgb A1c  Recent Labs  05/21/16 1651  HGBA1C 7.3*   Lipid Profile No results for input(s): CHOL, HDL, LDLCALC, TRIG, CHOLHDL, LDLDIRECT in the last 72 hours. Thyroid function studies No results for input(s): TSH, T4TOTAL, T3FREE, THYROIDAB in the last 72 hours.  Invalid input(s): FREET3 Anemia work up No results for input(s): VITAMINB12, FOLATE, FERRITIN, TIBC, IRON, RETICCTPCT in the last 72 hours. Microbiology No results found for this or any previous visit (from the past 240 hour(s)).    Studies:  CT abdomen pelvis with contrast 05/21/2016 IMPRESSION: Fluid-filled dilated small bowel loops. Tumor involvement suspected as cause of the small bowel obstruction rather than adhesions.  Tiny omental  nodules suggestive of metastatic disease.  Right lung base and right middle lobe nodules suggestive of metastatic disease.  Remainder of findings unchanged.   Assessment: 75 y.o. malewith medical history significant fortype 2 diabetes mellitus, hypothyroidism, and stage IV pancreatic cancer who was admitted for recurrent small bowel obstruction. He was hospitalized for small bobs ocean 2-3 weeks ago.  1. Small bowel obstruction, resolving  2. Stage IV pancreatic adenocarcinoma, currently off chemotherapy 3. COPD 4. Diabetes 5. Hypothyroidism    Plan:  -Pt's small bowel obstruction symptoms has much improved, likely resolving, he is tolerating clear liquid well, diet will be advanced. -He was seen by general surgery Dr. Kieth Brightly, surgery was not recommended. -His SBO is likely related to his metastatic pancreatic cancer to small bowel or peritoneum. Given his recently increased CA19.9, I think it would be reasonable to restart chemo  -OK to discharge if his symptoms resolve, and I will let Dr. Learta Codding know that pt would like to see him early next week to discuss chemo and further management. Pt's wife is very anxious to get treatment started again, she is very afraid of recurrent SBO. We discussed diet, I recommend him to have liquid and soft, easy digested food, and consider nutritional supplement to Maintain his weight. -will follow up as needed, please call me if needed.    Albert Merle, MD 05/23/2016  7:25 PM

## 2016-05-23 NOTE — Progress Notes (Signed)
PROGRESS NOTE    Albert Richards  ZOX:[id number] DOB: 09/23/1940 DOA: 05/21/2016 PCP: Carylon Perches, MD    Brief Narrative:  75 y.o. male with medical history significant for type 2 diabetes mellitus, hypothyroidism, and stage IV pancreatic cancer who presents to the emergency department with acute abdominal pain and nausea. Patient was recently admitted to the hospital from 05/13/2016 to 05/16/2016 with a SBO that was felt to be secondary to tumor. The obstruction resolved spontaneously during the hospitalization and the patient was discharged home in much improved and stable condition. He reports occasional brief episodes of severe abdominal pain that resolved spontaneously within minutes, but developed severe acute pain in the lower abdomen at approximately 9 AM on the day of his admission and it persisted throughout the morning. Patient called his oncologist for advice and was directed by a physician assistant to the emergency department for evaluation. Patient describes his pain is severe, sharp, localized to the lower abdomen, associated with nausea, but no vomiting or diarrhea. The patient later vomited while in the emergency department. Patient reports having a small bowel movement earlier this morning before the onset of pain. He has not attempted any interventions for his symptoms prior to coming into the ED for evaluation. He describes his symptoms as being the same in location and character as his recent SBO. He denies fevers or chills, denies cough or dyspnea, and denies chest pain or palpitations.    Assessment & Plan:   Principal Problem:   SBO (small bowel obstruction) Active Problems:   Stage IV adenocarcinoma of pancreas (HCC)   COPD (chronic obstructive pulmonary disease) (HCC)   Diabetes mellitus (HCC)   Hypothyroid   1. Recurrent SBO  -Patient was recently discharged for management of small bowel obstruction, readmitted after recurrence of SBO 4 days later -Shortly after  admission, patient noted to pass flatus and is currently tolerating a clear liquid diet -Appreciate input by general surgery. No plans for surgery at this time -Have discussed case with oncology on call who will see patient in consultation for any further recommendations at this point. -Patient continues with normal sounding bowel sounds and remains ambulatory in the hallways. For now, will continue with supportive care as tolerated  2. Stage IV pancreatic cancer -Patient is followed by Dr. Truett Perna as outpatient -Records were reviewed. - Followed by Dr. Truett Perna of oncology and under consideration for salvage chemotherapy per recent notes  -Discussed case with oncology on call who will see patient in consultation  3. Type II DM  -We'll continue sliding-scale insulin needed -Thus far, glucose stable  4. Hypothyroidism  -Continue thyroid replacement  5. COPD -Lungs clear on exam with no audible wheezing -Continue PRN nebs  6. Leukocytosis -Labs reviewed -WBCs normalized   DVT prophylaxis: Heparin subcutaneous Code Status: Full code Family Communication: Patient in room, family at bedside Disposition Plan: Uncertain at this time  Consultants:   General surgery  Oncology  Procedures:     Antimicrobials: Anti-infectives    None      Subjective: Tolerating clear liquid diet. Patient requesting advanced diet  Objective: Vitals:   05/22/16 1457 05/22/16 2005 05/23/16 0436 05/23/16 1311  BP: 138/67 128/80 107/72 132/86  Pulse: (!) 57 (!) 56 (!) 52 (!) 56  Resp: 18 17 17 16   Temp: 97.6 F (36.4 C) 98.2 F (36.8 C) 97.4 F (36.3 C) 98.1 F (36.7 C)  TempSrc: Oral Oral Oral Oral  SpO2: 95% 95% 97% 95%  Weight:   97.5 kg (215  lb)   Height:        Intake/Output Summary (Last 24 hours) at 05/23/16 1535 Last data filed at 05/23/16 1045  Gross per 24 hour  Intake          1951.66 ml  Output              500 ml  Net          1451.66 ml   Filed Weights    05/21/16 2014 05/22/16 0410 05/23/16 0436  Weight: 97.5 kg (215 lb) 97.5 kg (215 lb) 97.5 kg (215 lb)    Examination:  General exam: Lying bed, no acute distress, conversant Respiratory system: Normal respiratory effort, no audible wheezing Cardiovascular system: Regular rate, S1-S2 Gastrointestinal system: Positive bowel sounds, nondistended Central nervous system: CN II through XII grossly intact, sensation intact Extremities: Perfused, no clubbing Skin: Normal skin turgor, no notable skin lesions seen Psychiatry: Mood normal, no visual hallucinations  Data Reviewed: I have personally reviewed following labs and imaging studies  CBC:  Recent Labs Lab 05/21/16 1651 05/23/16 0500  WBC 12.9* 7.9  HGB 15.7 14.2  HCT 45.4 42.7  MCV 94.8 96.6  PLT 216 227   Basic Metabolic Panel:  Recent Labs Lab 05/21/16 1651 05/22/16 0628  NA 140 138  K 3.5 4.0  CL 108 109  CO2 23 25  GLUCOSE 136* 112*  BUN 13 14  CREATININE 0.86 0.99  CALCIUM 8.2* 8.2*  MG  --  2.0   GFR: Estimated Creatinine Clearance: 75.5 mL/min (by C-G formula based on SCr of 0.99 mg/dL). Liver Function Tests:  Recent Labs Lab 05/21/16 1651  AST 19  ALT 15*  ALKPHOS 110  BILITOT 0.7  PROT 7.0  ALBUMIN 4.0    Recent Labs Lab 05/21/16 1651  LIPASE 13   No results for input(s): AMMONIA in the last 168 hours. Coagulation Profile: No results for input(s): INR, PROTIME in the last 168 hours. Cardiac Enzymes: No results for input(s): CKTOTAL, CKMB, CKMBINDEX, TROPONINI in the last 168 hours. BNP (last 3 results) No results for input(s): PROBNP in the last 8760 hours. HbA1C:  Recent Labs  05/21/16 1651  HGBA1C 7.3*   CBG:  Recent Labs Lab 05/22/16 2000 05/23/16 0006 05/23/16 0418 05/23/16 0818 05/23/16 1141  GLUCAP 91 78 84 93 198*   Lipid Profile: No results for input(s): CHOL, HDL, LDLCALC, TRIG, CHOLHDL, LDLDIRECT in the last 72 hours. Thyroid Function Tests: No results for  input(s): TSH, T4TOTAL, FREET4, T3FREE, THYROIDAB in the last 72 hours. Anemia Panel: No results for input(s): VITAMINB12, FOLATE, FERRITIN, TIBC, IRON, RETICCTPCT in the last 72 hours. Sepsis Labs: No results for input(s): PROCALCITON, LATICACIDVEN in the last 168 hours.  No results found for this or any previous visit (from the past 240 hour(s)).   Radiology Studies: Ct Abdomen Pelvis W Contrast  Result Date: 05/21/2016 CLINICAL DATA:  75 year old diabetic male presenting with abdominal pain. Pancreatic cancer diagnosed 2014. Currently not on chemotherapy. Subsequent encounter. EXAM: CT ABDOMEN AND PELVIS WITH CONTRAST TECHNIQUE: Multidetector CT imaging of the abdomen and pelvis was performed using the standard protocol following bolus administration of intravenous contrast. CONTRAST:  ISOVUE-300 IOPAMIDOL (ISOVUE-300) INJECTION 61% COMPARISON:  05/13/2016 CT. FINDINGS: Lower chest: Right lung base and right middle lobe nodules unchanged consistent with metastatic disease. Hepatobiliary: No mass.  No gallstones. Pancreas: Atrophic. Spleen: Slightly irregular without mass or enlargement. Adrenals/Urinary Tract: Bilateral renal cysts larger on the right. No hydronephrosis. No adrenal gland enlargement. Stomach/Bowel:  Fluid-filled dilated small bowel loops. Tumor involvement suspected as cause of the small bowel obstruction rather than adhesions. Tiny omental nodules suggestive of metastatic disease. Vascular/Lymphatic: Mild calcified plaque aorta with slight ectasia. No adenopathy. Reproductive: Top-normal size prostate gland. Other: Prominent fat inguinal canal. No free intraperitoneal air or drainable fluid collection. Musculoskeletal: Appearance of hemangiomas L1 and L2. Degenerative changes lower lumbar spine. IMPRESSION: Fluid-filled dilated small bowel loops. Tumor involvement suspected as cause of the small bowel obstruction rather than adhesions. Tiny omental nodules suggestive of  metastatic disease. Right lung base and right middle lobe nodules suggestive of metastatic disease. Remainder of findings unchanged. Electronically Signed   By: Lacy Duverney M.D.   On: 05/21/2016 18:01    Scheduled Meds: . famotidine (PEPCID) IV  20 mg Intravenous Q12H  . gabapentin  600 mg Oral BID  . heparin  5,000 Units Subcutaneous Q8H  . insulin aspart  0-9 Units Subcutaneous Q4H  . levothyroxine  150 mcg Oral QODAY  . tamsulosin  0.4 mg Oral Daily   Continuous Infusions: . sodium chloride 100 mL/hr at 05/23/16 1154     LOS: 2 days   Sharell Hilmer, Scheryl Marten, MD [facility] Pager [phone]  If 7PM-7AM, please contact night-coverage [email or web address] Password [email or web address] 05/23/2016, 3:35 PM

## 2016-05-24 ENCOUNTER — Telehealth: Payer: Self-pay | Admitting: Oncology

## 2016-05-24 ENCOUNTER — Telehealth: Payer: Self-pay | Admitting: *Deleted

## 2016-05-24 DIAGNOSIS — C799 Secondary malignant neoplasm of unspecified site: Secondary | ICD-10-CM

## 2016-05-24 LAB — GLUCOSE, CAPILLARY
GLUCOSE-CAPILLARY: 111 mg/dL — AB (ref 65–99)
Glucose-Capillary: 93 mg/dL (ref 65–99)

## 2016-05-24 MED ORDER — FAMOTIDINE 20 MG PO TABS
20.0000 mg | ORAL_TABLET | Freq: Two times a day (BID) | ORAL | Status: DC
  Administered 2016-05-24: 20 mg via ORAL
  Filled 2016-05-24: qty 1

## 2016-05-24 MED ORDER — HEPARIN SOD (PORK) LOCK FLUSH 100 UNIT/ML IV SOLN
500.0000 [IU] | INTRAVENOUS | Status: AC | PRN
  Administered 2016-05-24: 500 [IU]
  Filled 2016-05-24: qty 5

## 2016-05-24 NOTE — Telephone Encounter (Signed)
Per Dr. Burr Medico (covering MD), pt's wife is requesting for him to be seen sooner than 11/7. Left message informing her the schedulers will contact them with appt for 11/2. No sooner appts available.

## 2016-05-24 NOTE — Telephone Encounter (Signed)
lvm to inform pt of appt option with [facility] on 11/2 due to LT has no availability. Waiting on confirmation of appt change. Until then placed a hold on Albert Richards 930 am appt slot just in case pt would like the early appt on 11/2 or keep the 11/7 appt with BS

## 2016-05-24 NOTE — Discharge Summary (Signed)
Physician Discharge Summary  MARQUEZ COYLE [id number] DOB: 10/06/1940 DOA: 05/21/2016  PCP: [name], MD  Admit date: 05/21/2016 Discharge date: 05/24/2016  Admitted From: Home Disposition:  Home  Recommendations for Outpatient Follow-up:  1. Follow up with PCP in 1-2 weeks 2. Follow up with Oncology as scheduled  Discharge Condition:Improved CODE STATUS:Full Diet recommendation: Soft   Brief/Interim Summary:  75 y.o.malewith medical history significant fortype 2 diabetes mellitus, hypothyroidism, and stage IV pancreatic cancer who presents to the emergency department with acute abdominal pain and nausea. Patient was recently admitted to the hospital from 05/13/2016 to 05/16/2016 with a SBOthat was felt to be secondary to tumor. The obstruction resolved spontaneously during the hospitalization and the patient was discharged home in much improved and stable condition. He reports occasional brief episodes of severe abdominal pain that resolved spontaneously within minutes, but developed severe acute pain in the lower abdomen at approximately 9 AM on the day of his admission and it persisted throughout the morning. Patient called his oncologist for advice and was directed by a physician assistant to the emergency department for evaluation. Patient describes his pain is severe, sharp, localized to the lower abdomen, associated with nausea, but no vomiting or diarrhea. The patient later vomited while in the emergency department. Patient reports having a small bowel movement earlier this morning before the onset of pain. He has not attempted any interventions for his symptoms prior to coming into the ED for evaluation. He describes his symptoms as being the same in location and character as his recent SBO. He denies fevers or chills, denies cough or dyspnea, and denies chest pain or palpitations.  1. Recurrent SBO  -Patient was recently discharged for management of small bowel  obstruction, readmitted after recurrence of SBO 4 days later -Shortly after admission, patient noted to pass flatus and is currently tolerating a clear liquid diet -Appreciate input by general surgery. No plans for surgery at this time [facility], recommendations to follow up closely as outpatient. Possible chemo as outpatient -Patient continues with normal sounding bowel sounds and remains ambulatory in the hallways.  2. Stage IV pancreatic cancer -Patient is followed by Dr. Benay Spice as outpatient -Records were reviewed. - Followed by Dr. Benay Spice of oncology and under consideration for salvage chemotherapy per recent notes  -Discussed case with oncology on call who saw patient in consultation  3. Type II DM  -Continued sliding-scale insulin -Thus far, glucose remained stable  4. Hypothyroidism  -Continued thyroid replacement  5. COPD -Lungs clear on exam with no audible wheezing -Continue PRN nebs  6. Leukocytosis -Labs reviewed -WBCs normalized  Discharge Diagnoses:  Principal Problem:   SBO (small bowel obstruction) Active Problems:   Stage IV adenocarcinoma of pancreas (HCC)   COPD (chronic obstructive pulmonary disease) (HCC)   Diabetes mellitus ([place])   Hypothyroid   Metastatic cancer ([place])    Discharge Instructions     Medication List    TAKE these medications   gabapentin 600 MG tablet Commonly known as:  NEURONTIN Take 600 mg by mouth 2 (two) times daily.   glipiZIDE 10 MG 24 hr tablet Commonly known as:  GLUCOTROL XL Take 10 mg by mouth daily with breakfast.   HYDROcodone-acetaminophen 5-325 MG tablet Commonly known as:  NORCO/VICODIN Take 1-2 tablets every 4-6 hours as needed for pain What changed:  how much to take  how to take this  when to take this  reasons to take this  additional instructions   levothyroxine 150 MCG tablet  Commonly known as:  SYNTHROID, LEVOTHROID Take 150 mcg by mouth every other day.   naproxen  sodium 220 MG tablet Commonly known as:  ANAPROX Take 220 mg by mouth 2 (two) times daily as needed.   nitroGLYCERIN 0.4 MG SL tablet Commonly known as:  NITROSTAT Place 0.4 mg under the tongue as needed for chest pain.   omeprazole 20 MG capsule Commonly known as:  PRILOSEC Take 20 mg by mouth every morning.   tamsulosin 0.4 MG Caps capsule Commonly known as:  FLOMAX Take 0.4 mg by mouth every morning.      Follow-up Information    FAGAN,ROY, MD. Schedule an appointment as soon as possible for a visit in 2 week(s).   Specialty:  Internal Medicine Contact information: [address] [place] [address] [phone]          No Known Allergies  Consultations:  General Surgery  Oncology  Procedures/Studies: Ct Abdomen Pelvis W Contrast  Result Date: 05/21/2016 CLINICAL DATA:  75 year old diabetic male presenting with abdominal pain. Pancreatic cancer diagnosed 2014. Currently not on chemotherapy. Subsequent encounter. EXAM: CT ABDOMEN AND PELVIS WITH CONTRAST TECHNIQUE: Multidetector CT imaging of the abdomen and pelvis was performed using the standard protocol following bolus administration of intravenous contrast. CONTRAST:  168mL ISOVUE-300 IOPAMIDOL (ISOVUE-300) INJECTION 61% COMPARISON:  05/13/2016 CT. FINDINGS: Lower chest: Right lung base and right middle lobe nodules unchanged consistent with metastatic disease. Hepatobiliary: No mass.  No gallstones. Pancreas: Atrophic. Spleen: Slightly irregular without mass or enlargement. Adrenals/Urinary Tract: Bilateral renal cysts larger on the right. No hydronephrosis. No adrenal gland enlargement. Stomach/Bowel: Fluid-filled dilated small bowel loops. Tumor involvement suspected as cause of the small bowel obstruction rather than adhesions. Tiny omental nodules suggestive of metastatic disease. Vascular/Lymphatic: Mild calcified plaque aorta with slight ectasia. No adenopathy. Reproductive: Top-normal size prostate  gland. Other: Prominent fat inguinal canal. No free intraperitoneal air or drainable fluid collection. Musculoskeletal: Appearance of hemangiomas L1 and L2. Degenerative changes lower lumbar spine. IMPRESSION: Fluid-filled dilated small bowel loops. Tumor involvement suspected as cause of the small bowel obstruction rather than adhesions. Tiny omental nodules suggestive of metastatic disease. Right lung base and right middle lobe nodules suggestive of metastatic disease. Remainder of findings unchanged. Electronically Signed   By: Genia Del M.D.   On: 05/21/2016 18:01   Ct Abdomen Pelvis W Contrast  Result Date: 05/13/2016 CLINICAL DATA:  Lower abdominal pain. Current history of pancreatic cancer. EXAM: CT ABDOMEN AND PELVIS WITH CONTRAST TECHNIQUE: Multidetector CT imaging of the abdomen and pelvis was performed using the standard protocol following bolus administration of intravenous contrast. CONTRAST:  140mL ISOVUE-300 IOPAMIDOL (ISOVUE-300) INJECTION 61% COMPARISON:  CT scan of April 22, 2016. FINDINGS: Lower chest: At least 2 nodules are noted in the right lung base concerning for pulmonary metastases. Hepatobiliary: No gallstones are noted.  Normal liver. Pancreas: Stable pancreatic atrophy. Spleen: No abnormality seen. Adrenals/Urinary Tract: Adrenal glands appear normal. Stable bilateral renal cysts. No hydronephrosis or renal obstruction is noted. No renal or ureteral calculi are noted. Urinary bladder appears normal. Stomach/Bowel: Mild small bowel obstruction is noted with transition zone seen in central portion of abdomen best seen on image number 50 of series 2. 2 cm soft tissue abnormality is noted along the small bowel in this area that appears to be called on the extraction, consistent with peritoneal metastatic implant. No colonic dilatation is noted probable 1.7 cm peritoneal implants seen at the junction of the sigmoid colon and rectum. Several other  implants are seen along the small  bowel. Several nodular densities are seen in the right side of the omentum anteriorly concerning for peritoneal implants. Vascular/Lymphatic: Atherosclerosis of abdominal aorta is noted without aneurysm formation. No significant adenopathy is noted. Reproductive: Stable mild prostatic enlargement is noted. Other: No abnormal fluid collection is noted. Musculoskeletal: Multilevel degenerative disc disease is noted in the lower lumbar spine. 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 Stable pancreatic atrophy. 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 Mild small bowel obstruction is noted, with transition zone seen centrally in the abdomen, due to peritoneal implant on the small bowel in this area. Electronically Signed   By: Marijo Conception, M.D.   On: 05/13/2016 14:56     Subjective: No complaints this AM.   Discharge Exam: Vitals:   05/23/16 2028 05/24/16 0411  BP: 137/82 121/75  Pulse: (!) 55 (!) 51  Resp: 16 16  Temp: 97.8 F (36.6 C) 97.7 F (36.5 C)   Vitals:   05/23/16 0436 05/23/16 1311 05/23/16 2028 05/24/16 0411  BP: 107/72 132/86 137/82 121/75  Pulse: (!) 52 (!) 56 (!) 55 (!) 51  Resp: 17 16 16 16   Temp: 97.4 F (36.3 C) 98.1 F (36.7 C) 97.8 F (36.6 C) 97.7 F (36.5 C)  TempSrc: Oral Oral Oral Oral  SpO2: 97% 95% 95% 95%  Weight: 97.5 kg (215 lb)     Height:        General: Pt is alert, awake, not in acute distress Cardiovascular: RRR, S1/S2 +, no rubs, no gallops Respiratory: CTA bilaterally, no wheezing, no rhonchi Abdominal: Soft, NT, ND, bowel sounds + Extremities: no edema, no cyanosis   The results of significant diagnostics from this hospitalization (including imaging, microbiology, ancillary and laboratory) are listed below for reference.     Microbiology: No results found for this or any previous visit (from the  past 240 hour(s)).   Labs: BNP (last 3 results) No results for input(s): BNP in the last 8760 hours. Basic Metabolic Panel:  Recent Labs Lab 05/21/16 1651 05/22/16 0628  NA 140 138  K 3.5 4.0  CL 108 109  CO2 23 25  GLUCOSE 136* 112*  BUN 13 14  CREATININE 0.86 0.99  CALCIUM 8.2* 8.2*  MG  --  2.0   Liver Function Tests:  Recent Labs Lab 05/21/16 1651  AST 19  ALT 15*  ALKPHOS 110  BILITOT 0.7  PROT 7.0  ALBUMIN 4.0    Recent Labs Lab 05/21/16 1651  LIPASE 13   No results for input(s): AMMONIA in the last 168 hours. CBC:  Recent Labs Lab 05/21/16 1651 05/23/16 0500  WBC 12.9* 7.9  HGB 15.7 14.2  HCT 45.4 42.7  MCV 94.8 96.6  PLT 216 227   Cardiac Enzymes: No results for input(s): CKTOTAL, CKMB, CKMBINDEX, TROPONINI in the last 168 hours. BNP: Invalid input(s): POCBNP CBG:  Recent Labs Lab 05/23/16 1601 05/23/16 2024 05/24/16 0001 05/24/16 0407 05/24/16 0736  GLUCAP 137* 181* 87 93 111*   D-Dimer No results for input(s): DDIMER in the last 72 hours. Hgb A1c  Recent Labs  05/21/16 1651  HGBA1C 7.3*   Lipid Profile No results for input(s): CHOL, HDL, LDLCALC, TRIG, CHOLHDL, LDLDIRECT in the last 72 hours. Thyroid function studies No results for input(s): TSH, T4TOTAL, T3FREE, THYROIDAB in the last 72 hours.  Invalid input(s): FREET3 Anemia  work up No results for input(s): VITAMINB12, FOLATE, FERRITIN, TIBC, IRON, RETICCTPCT in the last 72 hours. Urinalysis    Component Value Date/Time   COLORURINE YELLOW 05/21/2016 1701   APPEARANCEUR CLEAR 05/21/2016 1701   LABSPEC 1.017 05/21/2016 1701   PHURINE 6.0 05/21/2016 1701   GLUCOSEU NEGATIVE 05/21/2016 1701   HGBUR NEGATIVE 05/21/2016 1701   BILIRUBINUR NEGATIVE 05/21/2016 1701   KETONESUR NEGATIVE 05/21/2016 1701   PROTEINUR NEGATIVE 05/21/2016 1701   NITRITE NEGATIVE 05/21/2016 1701   LEUKOCYTESUR NEGATIVE 05/21/2016 1701   Sepsis Labs Invalid input(s): PROCALCITONIN,  WBC,   LACTICIDVEN Microbiology No results found for this or any previous visit (from the past 240 hour(s)).   SIGNED:   Donne Hazel, MD  [facility] 05/24/2016, 10:07 AM  If 7PM-7AM, please contact night-coverage [email or web address] Password [email or web address]

## 2016-05-24 NOTE — Progress Notes (Addendum)
Pt port deaccessed and flushed with 5cc Heparin and 10 ml saline. Pt d/c'd to home with wife.Went over  D/C papers, appts and meds with pt and wife, answered all questions. Pt taken to front door in wheelchair in stable condition. Thomasene Lot, RN

## 2016-05-28 ENCOUNTER — Telehealth: Payer: Self-pay | Admitting: *Deleted

## 2016-05-28 NOTE — Telephone Encounter (Signed)
Message from pt's wife requesting to cancel 11/7 office visit. Returned call, informed her visit has been moved to 11/1, offered appointment for this afternoon.  She declined, stated they have gone back to  Dr. Tressie Stalker in [place]. She stated they are satisfied with Dr. Gearldine Shown care but decided to go back to [facility] since he is practicing again. Dr. Benay Spice made aware. Appts canceled.

## 2016-05-29 ENCOUNTER — Ambulatory Visit: Payer: Medicare Other | Admitting: Oncology

## 2016-05-29 ENCOUNTER — Other Ambulatory Visit: Payer: Medicare Other

## 2016-06-04 ENCOUNTER — Other Ambulatory Visit: Payer: Medicare Other

## 2016-06-04 ENCOUNTER — Ambulatory Visit: Payer: Medicare Other | Admitting: Oncology

## 2016-07-10 ENCOUNTER — Emergency Department (HOSPITAL_COMMUNITY): Payer: Medicare Other

## 2016-07-10 ENCOUNTER — Encounter (HOSPITAL_COMMUNITY): Payer: Self-pay

## 2016-07-10 ENCOUNTER — Inpatient Hospital Stay (HOSPITAL_COMMUNITY)
Admission: EM | Admit: 2016-07-10 | Discharge: 2016-07-26 | DRG: 329 | Disposition: A | Discharge status: Home Health | Payer: Medicare Other | Attending: General Surgery | Admitting: General Surgery

## 2016-07-10 DIAGNOSIS — J189 Pneumonia, unspecified organism: Secondary | ICD-10-CM | POA: Diagnosis not present

## 2016-07-10 DIAGNOSIS — J440 Chronic obstructive pulmonary disease with acute lower respiratory infection: Secondary | ICD-10-CM | POA: Diagnosis present

## 2016-07-10 DIAGNOSIS — Y832 Surgical operation with anastomosis, bypass or graft as the cause of abnormal reaction of the patient, or of later complication, without mention of misadventure at the time of the procedure: Secondary | ICD-10-CM | POA: Diagnosis not present

## 2016-07-10 DIAGNOSIS — J449 Chronic obstructive pulmonary disease, unspecified: Secondary | ICD-10-CM | POA: Diagnosis present

## 2016-07-10 DIAGNOSIS — C259 Malignant neoplasm of pancreas, unspecified: Secondary | ICD-10-CM | POA: Diagnosis present

## 2016-07-10 DIAGNOSIS — K631 Perforation of intestine (nontraumatic): Secondary | ICD-10-CM | POA: Diagnosis not present

## 2016-07-10 DIAGNOSIS — C784 Secondary malignant neoplasm of small intestine: Secondary | ICD-10-CM | POA: Diagnosis present

## 2016-07-10 DIAGNOSIS — K219 Gastro-esophageal reflux disease without esophagitis: Secondary | ICD-10-CM | POA: Diagnosis present

## 2016-07-10 DIAGNOSIS — E039 Hypothyroidism, unspecified: Secondary | ICD-10-CM | POA: Diagnosis present

## 2016-07-10 DIAGNOSIS — C7889 Secondary malignant neoplasm of other digestive organs: Secondary | ICD-10-CM

## 2016-07-10 DIAGNOSIS — K56609 Unspecified intestinal obstruction, unspecified as to partial versus complete obstruction: Secondary | ICD-10-CM | POA: Diagnosis present

## 2016-07-10 DIAGNOSIS — Z87891 Personal history of nicotine dependence: Secondary | ICD-10-CM

## 2016-07-10 DIAGNOSIS — E876 Hypokalemia: Secondary | ICD-10-CM | POA: Diagnosis not present

## 2016-07-10 DIAGNOSIS — I700 Atherosclerosis of aorta: Secondary | ICD-10-CM | POA: Diagnosis present

## 2016-07-10 DIAGNOSIS — Y95 Nosocomial condition: Secondary | ICD-10-CM | POA: Diagnosis not present

## 2016-07-10 DIAGNOSIS — Y92239 Unspecified place in hospital as the place of occurrence of the external cause: Secondary | ICD-10-CM | POA: Diagnosis not present

## 2016-07-10 DIAGNOSIS — E119 Type 2 diabetes mellitus without complications: Secondary | ICD-10-CM

## 2016-07-10 DIAGNOSIS — Z9221 Personal history of antineoplastic chemotherapy: Secondary | ICD-10-CM

## 2016-07-10 DIAGNOSIS — D649 Anemia, unspecified: Secondary | ICD-10-CM | POA: Diagnosis present

## 2016-07-10 DIAGNOSIS — E1151 Type 2 diabetes mellitus with diabetic peripheral angiopathy without gangrene: Secondary | ICD-10-CM | POA: Diagnosis present

## 2016-07-10 DIAGNOSIS — K567 Ileus, unspecified: Secondary | ICD-10-CM | POA: Diagnosis not present

## 2016-07-10 DIAGNOSIS — R0602 Shortness of breath: Secondary | ICD-10-CM

## 2016-07-10 DIAGNOSIS — Z87442 Personal history of urinary calculi: Secondary | ICD-10-CM

## 2016-07-10 DIAGNOSIS — N179 Acute kidney failure, unspecified: Secondary | ICD-10-CM | POA: Diagnosis present

## 2016-07-10 DIAGNOSIS — K91870 Postprocedural hematoma of a digestive system organ or structure following a digestive system procedure: Secondary | ICD-10-CM | POA: Diagnosis not present

## 2016-07-10 DIAGNOSIS — D696 Thrombocytopenia, unspecified: Secondary | ICD-10-CM | POA: Diagnosis present

## 2016-07-10 DIAGNOSIS — D72829 Elevated white blood cell count, unspecified: Secondary | ICD-10-CM

## 2016-07-10 LAB — COMPREHENSIVE METABOLIC PANEL
ALBUMIN: 3.9 g/dL (ref 3.5–5.0)
ALK PHOS: 228 U/L — AB (ref 38–126)
ALT: 22 U/L (ref 17–63)
ANION GAP: 11 (ref 5–15)
AST: 20 U/L (ref 15–41)
BUN: 16 mg/dL (ref 6–20)
CHLORIDE: 104 mmol/L (ref 101–111)
CO2: 24 mmol/L (ref 22–32)
Calcium: 8.9 mg/dL (ref 8.9–10.3)
Creatinine, Ser: 1.28 mg/dL — ABNORMAL HIGH (ref 0.61–1.24)
GFR calc Af Amer: >60 mL/min (ref >60)
GFR calc non Af Amer: 53 mL/min — ABNORMAL LOW (ref >60)
GLUCOSE: 219 mg/dL — AB (ref 65–99)
POTASSIUM: 3.6 mmol/L (ref 3.5–5.1)
SODIUM: 139 mmol/L (ref 135–145)
Total Bilirubin: 0.5 mg/dL (ref 0.3–1.2)
Total Protein: 7.3 g/dL (ref 6.5–8.1)

## 2016-07-10 LAB — DIFFERENTIAL
BASOS ABS: 0 10*3/uL (ref 0.0–0.1)
Basophils Relative: 0 %
Eosinophils Absolute: 0 10*3/uL (ref 0.0–0.7)
Eosinophils Relative: 0 %
Lymphocytes Relative: 7 %
Lymphs Abs: 1.9 10*3/uL (ref 0.7–4.0)
MONOS PCT: 4 %
Monocytes Absolute: 1.1 10*3/uL — ABNORMAL HIGH (ref 0.1–1.0)
NEUTROS ABS: 23.8 10*3/uL — AB (ref 1.7–7.7)
NEUTROS PCT: 89 %
NRBC: 1 /100{WBCs} — AB

## 2016-07-10 LAB — URINALYSIS, ROUTINE W REFLEX MICROSCOPIC
BACTERIA UA: NONE SEEN
BILIRUBIN URINE: NEGATIVE
Glucose, UA: NEGATIVE mg/dL
Hgb urine dipstick: NEGATIVE
KETONES UR: NEGATIVE mg/dL
Leukocytes, UA: NEGATIVE
Nitrite: NEGATIVE
PROTEIN: 30 mg/dL — AB
Specific Gravity, Urine: 1.026 (ref 1.005–1.030)
WBC UA: NONE SEEN WBC/hpf (ref 0–5)
pH: 5 (ref 5.0–8.0)

## 2016-07-10 LAB — CBC
HEMATOCRIT: 40.4 % (ref 39.0–52.0)
HEMOGLOBIN: 14.4 g/dL (ref 13.0–17.0)
MCH: 33.6 pg (ref 26.0–34.0)
MCHC: 35.6 g/dL (ref 30.0–36.0)
MCV: 94.2 fL (ref 78.0–100.0)
Platelets: 102 10*3/uL — ABNORMAL LOW (ref 150–400)
RBC: 4.29 MIL/uL (ref 4.22–5.81)
RDW: 15.8 % — ABNORMAL HIGH (ref 11.5–15.5)
WBC: 26.8 10*3/uL — ABNORMAL HIGH (ref 4.0–10.5)

## 2016-07-10 LAB — I-STAT CG4 LACTIC ACID, ED: Lactic Acid, Venous: 2.71 mmol/L (ref 0.5–1.9)

## 2016-07-10 LAB — LIPASE, BLOOD: LIPASE: 19 U/L (ref 11–51)

## 2016-07-10 MED ORDER — IOPAMIDOL (ISOVUE-300) INJECTION 61%
INTRAVENOUS | Status: AC
  Filled 2016-07-10: qty 100

## 2016-07-10 MED ORDER — IOPAMIDOL (ISOVUE-300) INJECTION 61%
100.0000 mL | Freq: Once | INTRAVENOUS | Status: AC | PRN
  Administered 2016-07-10: 100 mL via INTRAVENOUS

## 2016-07-10 MED ORDER — SODIUM CHLORIDE 0.9% FLUSH
10.0000 mL | INTRAVENOUS | Status: DC | PRN
  Administered 2016-07-13: 20 mL
  Administered 2016-07-15 – 2016-07-24 (×6): 10 mL
  Filled 2016-07-10 (×7): qty 40

## 2016-07-10 MED ORDER — ONDANSETRON HCL 4 MG/2ML IJ SOLN
4.0000 mg | Freq: Once | INTRAMUSCULAR | Status: AC
  Administered 2016-07-10: 4 mg via INTRAVENOUS
  Filled 2016-07-10: qty 2

## 2016-07-10 MED ORDER — SODIUM CHLORIDE 0.9 % IJ SOLN
INTRAMUSCULAR | Status: AC
  Administered 2016-07-10: 1 mL
  Filled 2016-07-10: qty 50

## 2016-07-10 MED ORDER — SODIUM CHLORIDE 0.9 % IV SOLN
Freq: Once | INTRAVENOUS | Status: AC
  Administered 2016-07-11: 01:00:00 via INTRAVENOUS

## 2016-07-10 MED ORDER — HYDROMORPHONE HCL 1 MG/ML IJ SOLN
0.5000 mg | INTRAMUSCULAR | Status: AC | PRN
  Administered 2016-07-10 – 2016-07-11 (×2): 0.5 mg via INTRAVENOUS
  Filled 2016-07-10 (×2): qty 1

## 2016-07-10 MED ORDER — ALTEPLASE 2 MG IJ SOLR
2.0000 mg | Freq: Once | INTRAMUSCULAR | Status: AC
  Administered 2016-07-10: 2 mg
  Filled 2016-07-10: qty 2

## 2016-07-10 MED ORDER — SODIUM CHLORIDE 0.9 % IV BOLUS (SEPSIS)
1000.0000 mL | Freq: Once | INTRAVENOUS | Status: AC
  Administered 2016-07-10: 1000 mL via INTRAVENOUS

## 2016-07-10 NOTE — ED Provider Notes (Signed)
[facility] DEPT Provider Note   CSN: [id number] Arrival date & time: 07/10/16  2057     History   Chief Complaint Chief Complaint  Patient presents with  . Abdominal Pain    HPI Albert Richards is a 75 y.o. male.  HPI 75 year old male who presents with abdominal pain. He has a history of stage IV pancreatic cancer, last receiving chemotherapy one week ago. He has a history of bowel obstruction, that was medically managed 1-2 months ago. States that over the past 4-5 days he has had intermittent diffuse abdominal pain with nausea and vomiting. Did initially take morphine for pain control to some good effect, but over the past 2 days has been having escalating abdominal pain with nausea and vomiting. Last bowel movement was about 2 days ago. No fevers or chills or urinary symptoms. This is similar to when he has had bowel obstruction in the past, although much more severe in pain.   Past Medical History:  Diagnosis Date  . Arthritis   . Bowel obstruction   . 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 Diabetes mellitus without complication ([place])   . History of kidney stones 10-12 yrs ago  . Hypothyroidism   . pancreatic ca dx'd 2014  . Pneumonia   . [place] spotted fever     Patient Active Problem List   Diagnosis Date Noted  . Metastatic cancer ([place])   . Nausea vomiting and diarrhea   . SBO (small bowel obstruction) 05/13/2016  . Diabetes mellitus ([place]) 05/13/2016  . Hypothyroid 05/13/2016  . Atherosclerosis of aorta ([place]) 07/07/2013  . Stage IV adenocarcinoma of pancreas ([place]) 06/30/2013  . COPD (chronic obstructive pulmonary disease) ([place]) 06/30/2013    Past Surgical History:  Procedure Laterality Date  . AMPUTATION Left 01/08/2016   Procedure: AMPUTATION REVISION LEFT FIRST FINGER ;  Surgeon: Charlotte Crumb, MD;  Location: [facility];  Service: Orthopedics;  Laterality: Left;  . EUS N/A 07/08/2013   Procedure: UPPER ENDOSCOPIC  ULTRASOUND (EUS) LINEAR;  Surgeon: Milus Banister, MD;  Location: [facility];  Service: Endoscopy;  Laterality: N/A;  . I&D EXTREMITY Left 01/08/2016   Procedure: IRRIGATION AND DEBRIDEMENT LEFT THUMB;  Surgeon: Charlotte Crumb, MD;  Location: [facility];  Service: Orthopedics;  Laterality: Left;  . PORTACATH PLACEMENT N/A 07/30/2013   Procedure: INSERTION PORT-A-CATH;  Surgeon: Stark Klein, MD;  Location: [facility];  Service: General;  Laterality: N/A;  . TONSILLECTOMY  as child       Home Medications    Prior to Admission medications   Medication Sig Start Date End Date Taking? Authorizing Provider  gabapentin (NEURONTIN) 600 MG tablet Take 600 mg by mouth 2 (two) times daily. 01/10/16  Yes Historical Provider, MD  glipiZIDE (GLUCOTROL XL) 10 MG 24 hr tablet Take 10 mg by mouth daily with breakfast.  11/07/15  Yes Historical Provider, MD  HYDROcodone-acetaminophen (NORCO/VICODIN) 5-325 MG tablet Take 1-2 tablets every 4-6 hours as needed for pain Patient taking differently: Take 1-2 tablets by mouth every 4 (four) hours as needed for moderate pain or severe pain.  05/01/16  Yes Ladell Pier, MD  levothyroxine (SYNTHROID, LEVOTHROID) 175 MCG tablet Take 175 mcg by mouth daily before breakfast.   Yes Historical Provider, MD  LORazepam (ATIVAN) 1 MG tablet Take 1 mg by mouth every 3 (three) hours.   Yes Historical Provider, MD  morphine (MSIR) 15 MG tablet Take 15 mg by mouth every 6 (six) hours as  needed for severe pain.   Yes Historical Provider, MD  naproxen sodium (ANAPROX) 220 MG tablet Take 220 mg by mouth 2 (two) times daily as needed.   Yes Historical Provider, MD  omeprazole (PRILOSEC) 20 MG capsule Take 20 mg by mouth every morning.    Yes Historical Provider, MD  Tamsulosin HCl (FLOMAX) 0.4 MG CAPS Take 0.4 mg by mouth every morning.   Yes Historical Provider, MD  nitroGLYCERIN (NITROSTAT) 0.4 MG SL tablet Place 0.4 mg under the tongue as needed for chest pain.  03/09/15   Historical  Provider, MD    Family History History reviewed. No pertinent family history.  Social History Social History  Substance Use Topics  . Smoking status: Former Smoker    Packs/day: 1.00    Years: 50.00    Types: Cigarettes    Start date: 07/30/1955    Quit date: 07/29/2005  . Smokeless tobacco: Never Used  . Alcohol use No     Allergies   Patient has no known allergies.   Review of Systems Review of Systems 10/14 systems reviewed and are negative other than those stated in the HPI   Physical Exam Updated Vital Signs BP 114/73   Pulse 102   Resp 23   Ht 5\' 10"  (1.778 m)   Wt 215 lb (97.5 kg)   SpO2 95%   BMI 30.85 kg/m   Physical Exam Physical Exam  Nursing note and vitals reviewed. Constitutional: Chronically ill appearing, appears uncomfortable Head: Normocephalic and atraumatic.  Mouth/Throat: Oropharynx is clear and dry.  Neck: Normal range of motion. Neck supple.  Cardiovascular: Tachycardic rate and regular rhythm.   Pulmonary/Chest: Effort normal and breath sounds normal.  Abdominal: Soft. Moderate distension. Exquisite diffuse tenderness with guarding.  Musculoskeletal: Normal range of motion.  Neurological: Alert, no facial droop, fluent speech, moves all extremities symmetrically Skin: Skin is warm and dry.  Psychiatric: Cooperative   ED Treatments / Results  Labs (all labs ordered are listed, but only abnormal results are displayed) Labs Reviewed  COMPREHENSIVE METABOLIC PANEL - Abnormal; Notable for the following:       Result Value   Glucose, Bld 219 (*)    Creatinine, Ser 1.28 (*)    Alkaline Phosphatase 228 (*)    GFR calc non Af Amer 53 (*)    All other components within normal limits  CBC - Abnormal; Notable for the following:    WBC 26.8 (*)    RDW 15.8 (*)    Platelets 102 (*)    All other components within normal limits  URINALYSIS, ROUTINE W REFLEX MICROSCOPIC - Abnormal; Notable for the following:    APPearance TURBID (*)     Protein, ur 30 (*)    Squamous Epithelial / LPF 0-5 (*)    All other components within normal limits  DIFFERENTIAL - Abnormal; Notable for the following:    nRBC 1 (*)    Neutro Abs 23.8 (*)    Monocytes Absolute 1.1 (*)    All other components within normal limits  I-STAT CG4 LACTIC ACID, ED - Abnormal; Notable for the following:    Lactic Acid, Venous 2.71 (*)    All other components within normal limits  LIPASE, BLOOD  PROTIME-INR  APTT  I-STAT CG4 LACTIC ACID, ED  TYPE AND SCREEN    EKG  EKG Interpretation None       Radiology Ct Abdomen Pelvis W Contrast  Result Date: 07/11/2016 CLINICAL DATA:  Severe abdominal pain, nausea and vomiting.  History of metastatic pancreatic cancer, small bowel obstruction. Recurrent small bowel obstructions. EXAM: CT ABDOMEN AND PELVIS WITH CONTRAST TECHNIQUE: Multidetector CT imaging of the abdomen and pelvis was performed using the standard protocol following bolus administration of intravenous contrast. CONTRAST:  139mL ISOVUE-300 IOPAMIDOL (ISOVUE-300) INJECTION 61% COMPARISON:  CT abdomen and pelvis May 21, 2016 and CT abdomen and pelvis December 29, 2015 FINDINGS: LOWER CHEST: Multiple pulmonary nodules in the lung bases, some of which are new or, were not included in the field of view on prior examination. Lower lobe atelectasis. Heart size is mildly enlarged. No pericardial effusions. HEPATOBILIARY: Liver and gallbladder are normal. PANCREAS: Atrophic pancreas without discrete mass. SPLEEN: Normal. ADRENALS/URINARY TRACT: Kidneys are orthotopic, demonstrating symmetric enhancement. No nephrolithiasis, hydronephrosis or solid renal masses. 8.2 cm RIGHT upper pole simple cyst, additional smaller bilateral renal cysts. Too small to characterize hypodensities in the kidneys bilaterally. The unopacified ureters are normal in course and caliber. Delayed imaging through the kidneys demonstrates symmetric prompt contrast excretion within the proximal  urinary collecting system. Urinary bladder is decompressed and unremarkable. Normal adrenal glands. STOMACH/BOWEL: Multiple loops of thickened, matted small bowel LEFT abdomen with focal nodular thickening, small bowel feces and, 15 mm intramural nodule versus focal fluid collection. Multiple bubbles of gas/pneumatosis and distal duodenum and proximal jejunum. No hypoenhancing bowel to suggest necrosis/ischemic bowel. VASCULAR/LYMPHATIC: Aortoiliac vessels are normal in course and caliber, mild calcific atherosclerosis. Tiny mesenteric nodules in LEFT abdomen could be metastatic. No lymphadenopathy by CT size criteria. REPRODUCTIVE: Mild prostatomegaly invading the base the urinary bladder. OTHER: Small amount of perihepatic free fluid and, small amount of ascites in the pelvis. Moderate amount of upper abdomen pneumoperitoneum particular in the LEFT upper quadrant. MUSCULOSKELETAL: Nonacute. Moderate bilateral fat containing inguinal hernias. Multiple hemangiomas. Increasing cons acuity a subcentimeter lucency T10 inferior endplate equivocal for osseous metastasis. No pathologic fracture. IMPRESSION: Pneumoperitoneum consistent with perforated small bowel obstruction, likely within distal duodenum/proximal jejunum. Associated matted , thickened small bowel LEFT abdomen highly concerning for neoplastic involvement. 15 mm small bowel intramural nodule LEFT abdomen versus focal collection. New small to moderate amount of ascites. Nodules within included lung bases consistent with metastatic disease. Increasing conspicuity of lytic lesion in the thoracic spine concerning for osseous metastasis. Acute findings discussed with and reconfirmed by [facility] Slater Mcmanaman on 07/11/2016 at 12:30 am. Electronically Signed   By: Elon Alas M.D.   On: 07/11/2016 00:39   Dg Abd Portable 1 View  Result Date: 07/10/2016 CLINICAL DATA:  Nasogastric tube placement. EXAM: PORTABLE ABDOMEN - 1 VIEW COMPARISON:  CT abdomen and pelvis  May 21, 2016 FINDINGS: Nasogastric tube tip projects in distal esophagus. Included bowel gas pattern is nondilated and nonobstructive. IMPRESSION: Nasogastric tube tip projects in distal esophagus, recommend advancement. Electronically Signed   By: Elon Alas M.D.   On: 07/10/2016 23:41    Procedures Procedures (including critical care time) CRITICAL CARE Performed by: Forde Dandy   Total critical care time: 45 minutes  Critical care time was exclusive of separately billable procedures and treating other patients.  Critical care was necessary to treat or prevent imminent or life-threatening deterioration.  Critical care was time spent personally by me on the following activities: development of treatment plan with patient and/or surrogate as well as nursing, discussions with consultants, evaluation of patient's response to treatment, examination of patient, obtaining history from patient or surrogate, ordering and performing treatments and interventions, ordering and review of laboratory studies, ordering and review of radiographic studies, pulse  oximetry and re-evaluation of patient's condition.  Medications Ordered in ED Medications  sodium chloride flush (NS) 0.9 % injection 10-40 mL (not administered)  iopamidol (ISOVUE-300) 61 % injection (not administered)  sodium chloride 0.9 % injection (not administered)  piperacillin-tazobactam (ZOSYN) IVPB 3.375 g (3.375 g Intravenous New Bag/Given 07/11/16 0105)  sodium chloride 0.9 % bolus 1,000 mL (1,000 mLs Intravenous New Bag/Given 07/10/16 2224)  0.9 %  sodium chloride infusion ( Intravenous New Bag/Given 07/11/16 0104)  ondansetron (ZOFRAN) injection 4 mg (4 mg Intravenous Given 07/10/16 2224)  HYDROmorphone (DILAUDID) injection 0.5 mg (0.5 mg Intravenous Given 07/11/16 0105)  alteplase (CATHFLO ACTIVASE) injection 2 mg (2 mg Intracatheter Given 07/10/16 2251)  iopamidol (ISOVUE-300) 61 % injection 100 mL (100 mLs Intravenous  Contrast Given 07/10/16 2347)     Initial Impression / Assessment and Plan / ED Course  I have reviewed the triage vital signs and the nursing notes.  Pertinent labs & imaging results that were available during my care of the patient were reviewed by me and considered in my medical decision making (see chart for details).  Clinical Course     History of metastatic pancreatic cancer on chemotherapy who presents with severe abdominal pain, abdominal distention, nausea and vomiting. Concern for recurrent obstruction, perforated viscus, or infectious etiology such as appendicitis/diverticulitis, etc.   Blood work concerning for lactic elevated lactate of 2.7 and leukocytosis of 26. With acute kidney injury.   CT abdomen and pelvis visualized and reviewed with radiology. He has evidence of perforated small bowel obstruction with small bowel wall thickening in the left lower quadrant suggestive of tumor involvement. There is significant amount of pneumoperitoneum. Will receive Zosyn. Discussed with Dr. Marlou Starks from general surgery who will evaluate patient in the emergency department  Final Clinical Impressions(s) / ED Diagnoses   Final diagnoses:  Bowel perforation ([place])  Small bowel obstruction  Metastatic adenocarcinoma to pancreas [facility])    New Prescriptions New Prescriptions   No medications on file     Forde Dandy, MD 07/11/16 [phone]

## 2016-07-10 NOTE — ED Triage Notes (Signed)
PT C/O SEVERE ABDOMINAL PAIN WITH N/V. PT HAS A HX OF ABDOMINAL CA, AND HAS HAD SBO X2 BACK IN October. THE FAMILY STS THEY HAVE CONTACTED HIS DOCTOR, AND THEY ARE PLANNING FOR SURGERY, SO THEY SAID TO BRING HIM HERE.

## 2016-07-10 NOTE — ED Notes (Signed)
Notified EDP,Liu,MD. Pt. I-stat CG4 Lactic acid results 2.71 and RN,Rachel made aware.

## 2016-07-11 ENCOUNTER — Inpatient Hospital Stay (HOSPITAL_COMMUNITY): Payer: Medicare Other | Admitting: Registered Nurse

## 2016-07-11 ENCOUNTER — Encounter (HOSPITAL_COMMUNITY): Admission: EM | Disposition: A | Discharge status: Home Health | Payer: Self-pay | Source: Home / Self Care

## 2016-07-11 ENCOUNTER — Encounter (HOSPITAL_COMMUNITY): Payer: Self-pay | Admitting: General Surgery

## 2016-07-11 DIAGNOSIS — C784 Secondary malignant neoplasm of small intestine: Secondary | ICD-10-CM | POA: Diagnosis present

## 2016-07-11 DIAGNOSIS — C259 Malignant neoplasm of pancreas, unspecified: Secondary | ICD-10-CM | POA: Diagnosis present

## 2016-07-11 DIAGNOSIS — I700 Atherosclerosis of aorta: Secondary | ICD-10-CM | POA: Diagnosis present

## 2016-07-11 DIAGNOSIS — J189 Pneumonia, unspecified organism: Secondary | ICD-10-CM | POA: Diagnosis not present

## 2016-07-11 DIAGNOSIS — Y92239 Unspecified place in hospital as the place of occurrence of the external cause: Secondary | ICD-10-CM | POA: Diagnosis not present

## 2016-07-11 DIAGNOSIS — Z87891 Personal history of nicotine dependence: Secondary | ICD-10-CM | POA: Diagnosis not present

## 2016-07-11 DIAGNOSIS — J440 Chronic obstructive pulmonary disease with acute lower respiratory infection: Secondary | ICD-10-CM | POA: Diagnosis present

## 2016-07-11 DIAGNOSIS — K567 Ileus, unspecified: Secondary | ICD-10-CM | POA: Diagnosis not present

## 2016-07-11 DIAGNOSIS — E1151 Type 2 diabetes mellitus with diabetic peripheral angiopathy without gangrene: Secondary | ICD-10-CM | POA: Diagnosis present

## 2016-07-11 DIAGNOSIS — E039 Hypothyroidism, unspecified: Secondary | ICD-10-CM | POA: Diagnosis present

## 2016-07-11 DIAGNOSIS — Y832 Surgical operation with anastomosis, bypass or graft as the cause of abnormal reaction of the patient, or of later complication, without mention of misadventure at the time of the procedure: Secondary | ICD-10-CM | POA: Diagnosis not present

## 2016-07-11 DIAGNOSIS — K219 Gastro-esophageal reflux disease without esophagitis: Secondary | ICD-10-CM | POA: Diagnosis present

## 2016-07-11 DIAGNOSIS — D696 Thrombocytopenia, unspecified: Secondary | ICD-10-CM | POA: Diagnosis present

## 2016-07-11 DIAGNOSIS — Y95 Nosocomial condition: Secondary | ICD-10-CM | POA: Diagnosis not present

## 2016-07-11 DIAGNOSIS — N179 Acute kidney failure, unspecified: Secondary | ICD-10-CM | POA: Diagnosis present

## 2016-07-11 DIAGNOSIS — D649 Anemia, unspecified: Secondary | ICD-10-CM | POA: Diagnosis present

## 2016-07-11 DIAGNOSIS — Z87442 Personal history of urinary calculi: Secondary | ICD-10-CM | POA: Diagnosis not present

## 2016-07-11 DIAGNOSIS — E876 Hypokalemia: Secondary | ICD-10-CM | POA: Diagnosis not present

## 2016-07-11 DIAGNOSIS — Z9221 Personal history of antineoplastic chemotherapy: Secondary | ICD-10-CM | POA: Diagnosis not present

## 2016-07-11 DIAGNOSIS — K91870 Postprocedural hematoma of a digestive system organ or structure following a digestive system procedure: Secondary | ICD-10-CM | POA: Diagnosis not present

## 2016-07-11 DIAGNOSIS — K631 Perforation of intestine (nontraumatic): Secondary | ICD-10-CM | POA: Diagnosis present

## 2016-07-11 HISTORY — PX: LAPAROTOMY: SHX154

## 2016-07-11 LAB — BASIC METABOLIC PANEL
Anion gap: 8 (ref 5–15)
BUN: 17 mg/dL (ref 6–20)
CALCIUM: 7.5 mg/dL — AB (ref 8.9–10.3)
CO2: 24 mmol/L (ref 22–32)
Chloride: 108 mmol/L (ref 101–111)
Creatinine, Ser: 1.29 mg/dL — ABNORMAL HIGH (ref 0.61–1.24)
GFR, EST NON AFRICAN AMERICAN: 52 mL/min — AB (ref >60)
Glucose, Bld: 241 mg/dL — ABNORMAL HIGH (ref 65–99)
Potassium: 4.3 mmol/L (ref 3.5–5.1)
SODIUM: 140 mmol/L (ref 135–145)

## 2016-07-11 LAB — CBC
HCT: 36.3 % — ABNORMAL LOW (ref 39.0–52.0)
HEMOGLOBIN: 12.5 g/dL — AB (ref 13.0–17.0)
MCH: 33.6 pg (ref 26.0–34.0)
MCHC: 34.4 g/dL (ref 30.0–36.0)
MCV: 97.6 fL (ref 78.0–100.0)
PLATELETS: 112 10*3/uL — AB (ref 150–400)
RBC: 3.72 MIL/uL — ABNORMAL LOW (ref 4.22–5.81)
RDW: 16.3 % — AB (ref 11.5–15.5)
WBC: 46.4 10*3/uL — ABNORMAL HIGH (ref 4.0–10.5)

## 2016-07-11 LAB — GLUCOSE, CAPILLARY
GLUCOSE-CAPILLARY: 167 mg/dL — AB (ref 65–99)
GLUCOSE-CAPILLARY: 230 mg/dL — AB (ref 65–99)
GLUCOSE-CAPILLARY: 250 mg/dL — AB (ref 65–99)
Glucose-Capillary: 231 mg/dL — ABNORMAL HIGH (ref 65–99)

## 2016-07-11 LAB — I-STAT CG4 LACTIC ACID, ED: Lactic Acid, Venous: 2.04 mmol/L (ref 0.5–1.9)

## 2016-07-11 LAB — APTT: aPTT: 27 seconds (ref 24–36)

## 2016-07-11 LAB — PROTIME-INR
INR: 1.09
PROTHROMBIN TIME: 14.1 s (ref 11.4–15.2)

## 2016-07-11 LAB — TYPE AND SCREEN
ABO/RH(D): A POS
Antibody Screen: NEGATIVE

## 2016-07-11 LAB — MRSA PCR SCREENING: MRSA by PCR: NEGATIVE

## 2016-07-11 LAB — ABO/RH: ABO/RH(D): A POS

## 2016-07-11 SURGERY — LAPAROTOMY, EXPLORATORY
Anesthesia: General | Site: Abdomen

## 2016-07-11 MED ORDER — PROPOFOL 10 MG/ML IV BOLUS
INTRAVENOUS | Status: AC
  Filled 2016-07-11: qty 40

## 2016-07-11 MED ORDER — ETOMIDATE 2 MG/ML IV SOLN
INTRAVENOUS | Status: AC
  Filled 2016-07-11: qty 10

## 2016-07-11 MED ORDER — SODIUM CHLORIDE 0.9 % IR SOLN
Status: DC | PRN
  Administered 2016-07-11: 5000 mL

## 2016-07-11 MED ORDER — SODIUM CHLORIDE 0.9 % IV SOLN
INTRAVENOUS | Status: DC | PRN
  Administered 2016-07-11: 04:00:00 via INTRAVENOUS

## 2016-07-11 MED ORDER — LABETALOL HCL 5 MG/ML IV SOLN
INTRAVENOUS | Status: DC | PRN
  Administered 2016-07-11: 2.5 mg via INTRAVENOUS

## 2016-07-11 MED ORDER — KCL IN DEXTROSE-NACL 20-5-0.9 MEQ/L-%-% IV SOLN
INTRAVENOUS | Status: DC
  Administered 2016-07-11 – 2016-07-13 (×4): via INTRAVENOUS
  Filled 2016-07-11 (×6): qty 1000

## 2016-07-11 MED ORDER — SUGAMMADEX SODIUM 200 MG/2ML IV SOLN
INTRAVENOUS | Status: DC | PRN
  Administered 2016-07-11: 200 mg via INTRAVENOUS

## 2016-07-11 MED ORDER — FENTANYL CITRATE (PF) 100 MCG/2ML IJ SOLN
INTRAMUSCULAR | Status: AC
  Filled 2016-07-11: qty 2

## 2016-07-11 MED ORDER — PIPERACILLIN-TAZOBACTAM 3.375 G IVPB
3.3750 g | Freq: Three times a day (TID) | INTRAVENOUS | Status: AC
  Administered 2016-07-11 – 2016-07-16 (×15): 3.375 g via INTRAVENOUS
  Filled 2016-07-11 (×15): qty 50

## 2016-07-11 MED ORDER — ONDANSETRON HCL 4 MG/2ML IJ SOLN
4.0000 mg | Freq: Four times a day (QID) | INTRAMUSCULAR | Status: DC | PRN
  Administered 2016-07-17: 4 mg via INTRAVENOUS
  Filled 2016-07-11: qty 2

## 2016-07-11 MED ORDER — LACTATED RINGERS IV SOLN
INTRAVENOUS | Status: DC | PRN
  Administered 2016-07-11 (×3): via INTRAVENOUS

## 2016-07-11 MED ORDER — LABETALOL HCL 5 MG/ML IV SOLN
INTRAVENOUS | Status: AC
  Filled 2016-07-11: qty 4

## 2016-07-11 MED ORDER — ONDANSETRON HCL 4 MG/2ML IJ SOLN
INTRAMUSCULAR | Status: AC
  Filled 2016-07-11: qty 2

## 2016-07-11 MED ORDER — ONDANSETRON 4 MG PO TBDP: 4.0000 mg | ORAL_TABLET | Freq: Four times a day (QID) | ORAL | Status: DC | PRN

## 2016-07-11 MED ORDER — ROCURONIUM BROMIDE 100 MG/10ML IV SOLN
INTRAVENOUS | Status: DC | PRN
  Administered 2016-07-11: 50 mg via INTRAVENOUS

## 2016-07-11 MED ORDER — METHOCARBAMOL 1000 MG/10ML IJ SOLN
500.0000 mg | Freq: Three times a day (TID) | INTRAVENOUS | Status: DC
  Administered 2016-07-11 – 2016-07-12 (×3): 500 mg via INTRAVENOUS
  Filled 2016-07-11 (×2): qty 550
  Filled 2016-07-11: qty 5
  Filled 2016-07-11: qty 550

## 2016-07-11 MED ORDER — MORPHINE SULFATE (PF) 2 MG/ML IV SOLN
1.0000 mg | INTRAVENOUS | Status: DC | PRN
  Administered 2016-07-11: 2 mg via INTRAVENOUS
  Administered 2016-07-11: 4 mg via INTRAVENOUS
  Administered 2016-07-11 – 2016-07-12 (×6): 2 mg via INTRAVENOUS
  Filled 2016-07-11: qty 2
  Filled 2016-07-11 (×7): qty 1

## 2016-07-11 MED ORDER — ETOMIDATE 2 MG/ML IV SOLN
INTRAVENOUS | Status: DC | PRN
  Administered 2016-07-11: 16 mg via INTRAVENOUS

## 2016-07-11 MED ORDER — INSULIN ASPART 100 UNIT/ML ~~LOC~~ SOLN
0.0000 [IU] | SUBCUTANEOUS | Status: DC
  Administered 2016-07-11 (×2): 5 [IU] via SUBCUTANEOUS
  Administered 2016-07-12: 3 [IU] via SUBCUTANEOUS
  Administered 2016-07-12: 2 [IU] via SUBCUTANEOUS
  Administered 2016-07-12 (×4): 3 [IU] via SUBCUTANEOUS
  Administered 2016-07-13 – 2016-07-15 (×8): 2 [IU] via SUBCUTANEOUS
  Administered 2016-07-15 (×2): 3 [IU] via SUBCUTANEOUS
  Administered 2016-07-15: 2 [IU] via SUBCUTANEOUS
  Administered 2016-07-16 (×2): 3 [IU] via SUBCUTANEOUS
  Administered 2016-07-16 (×3): 2 [IU] via SUBCUTANEOUS
  Administered 2016-07-17: 3 [IU] via SUBCUTANEOUS
  Administered 2016-07-17 (×2): 2 [IU] via SUBCUTANEOUS
  Administered 2016-07-17 – 2016-07-18 (×3): 3 [IU] via SUBCUTANEOUS
  Administered 2016-07-18: 13:00:00 via SUBCUTANEOUS
  Administered 2016-07-18 (×2): 2 [IU] via SUBCUTANEOUS
  Administered 2016-07-18: 17:00:00 via SUBCUTANEOUS
  Administered 2016-07-19 (×2): 2 [IU] via SUBCUTANEOUS
  Administered 2016-07-19: 3 [IU] via SUBCUTANEOUS
  Administered 2016-07-19 – 2016-07-21 (×8): 2 [IU] via SUBCUTANEOUS
  Administered 2016-07-21: 20:00:00 3 [IU] via SUBCUTANEOUS
  Administered 2016-07-21: 2 [IU] via SUBCUTANEOUS
  Administered 2016-07-21: 13:00:00 3 [IU] via SUBCUTANEOUS
  Administered 2016-07-21: 04:00:00 2 [IU] via SUBCUTANEOUS
  Administered 2016-07-22: 3 [IU] via SUBCUTANEOUS
  Administered 2016-07-22 (×3): 2 [IU] via SUBCUTANEOUS
  Administered 2016-07-22: 17:00:00 3 [IU] via SUBCUTANEOUS
  Administered 2016-07-22: 21:00:00 2 [IU] via SUBCUTANEOUS
  Administered 2016-07-23: 17:00:00 3 [IU] via SUBCUTANEOUS
  Administered 2016-07-23 (×2): 2 [IU] via SUBCUTANEOUS
  Administered 2016-07-23: 3 [IU] via SUBCUTANEOUS
  Administered 2016-07-23 – 2016-07-24 (×2): 2 [IU] via SUBCUTANEOUS
  Administered 2016-07-24 (×3): 3 [IU] via SUBCUTANEOUS
  Administered 2016-07-25 (×4): 2 [IU] via SUBCUTANEOUS
  Administered 2016-07-26: 04:00:00 3 [IU] via SUBCUTANEOUS
  Administered 2016-07-26: 01:00:00 2 [IU] via SUBCUTANEOUS
  Filled 2016-07-11 (×5): qty 1

## 2016-07-11 MED ORDER — ONDANSETRON HCL 4 MG/2ML IJ SOLN
INTRAMUSCULAR | Status: DC | PRN
  Administered 2016-07-11: 4 mg via INTRAVENOUS

## 2016-07-11 MED ORDER — NITROGLYCERIN 0.4 MG SL SUBL: 0.4000 mg | SUBLINGUAL_TABLET | SUBLINGUAL | Status: DC | PRN

## 2016-07-11 MED ORDER — PIPERACILLIN-TAZOBACTAM 3.375 G IVPB
3.3750 g | Freq: Once | INTRAVENOUS | Status: AC
  Administered 2016-07-11: 3.375 g via INTRAVENOUS
  Filled 2016-07-11: qty 50

## 2016-07-11 MED ORDER — FENTANYL CITRATE (PF) 100 MCG/2ML IJ SOLN
INTRAMUSCULAR | Status: DC | PRN
  Administered 2016-07-11 (×5): 50 ug via INTRAVENOUS
  Administered 2016-07-11: 100 ug via INTRAVENOUS
  Administered 2016-07-11: 50 ug via INTRAVENOUS

## 2016-07-11 MED ORDER — PROMETHAZINE HCL 25 MG/ML IJ SOLN: 6.2500 mg | INTRAMUSCULAR | Status: DC | PRN

## 2016-07-11 MED ORDER — HYDROMORPHONE HCL 2 MG/ML IJ SOLN
0.2500 mg | INTRAMUSCULAR | Status: DC | PRN
  Administered 2016-07-11 (×4): 0.5 mg via INTRAVENOUS

## 2016-07-11 MED ORDER — PANTOPRAZOLE SODIUM 40 MG IV SOLR
40.0000 mg | Freq: Every day | INTRAVENOUS | Status: DC
  Administered 2016-07-11 – 2016-07-14 (×4): 40 mg via INTRAVENOUS
  Filled 2016-07-11 (×4): qty 40

## 2016-07-11 MED ORDER — LIDOCAINE HCL (CARDIAC) 20 MG/ML IV SOLN
INTRAVENOUS | Status: DC | PRN
  Administered 2016-07-11: 25 mg via INTRATRACHEAL
  Administered 2016-07-11: 100 mg via INTRAVENOUS

## 2016-07-11 MED ORDER — PHENYLEPHRINE 40 MCG/ML (10ML) SYRINGE FOR IV PUSH (FOR BLOOD PRESSURE SUPPORT)
PREFILLED_SYRINGE | INTRAVENOUS | Status: AC
  Filled 2016-07-11: qty 10

## 2016-07-11 MED ORDER — SUCCINYLCHOLINE CHLORIDE 20 MG/ML IJ SOLN
INTRAMUSCULAR | Status: DC | PRN
  Administered 2016-07-11: 140 mg via INTRAVENOUS

## 2016-07-11 MED ORDER — SUGAMMADEX SODIUM 200 MG/2ML IV SOLN
INTRAVENOUS | Status: AC
  Filled 2016-07-11: qty 2

## 2016-07-11 MED ORDER — PROPOFOL 10 MG/ML IV BOLUS
INTRAVENOUS | Status: DC | PRN
  Administered 2016-07-11: 50 mg via INTRAVENOUS

## 2016-07-11 MED ORDER — HYDROMORPHONE HCL 2 MG/ML IJ SOLN
INTRAMUSCULAR | Status: AC
  Filled 2016-07-11: qty 1

## 2016-07-11 MED ORDER — HEPARIN SODIUM (PORCINE) 5000 UNIT/ML IJ SOLN
5000.0000 [IU] | Freq: Three times a day (TID) | INTRAMUSCULAR | Status: DC
Start: 2016-07-12 — End: 2016-07-26
  Administered 2016-07-12 – 2016-07-26 (×42): 5000 [IU] via SUBCUTANEOUS
  Filled 2016-07-11 (×42): qty 1

## 2016-07-11 MED ORDER — MIDAZOLAM HCL 2 MG/2ML IJ SOLN
INTRAMUSCULAR | Status: AC
  Filled 2016-07-11: qty 2

## 2016-07-11 MED ORDER — LACTATED RINGERS IV SOLN
INTRAVENOUS | Status: DC | PRN
  Administered 2016-07-11: 04:00:00 via INTRAVENOUS

## 2016-07-11 SURGICAL SUPPLY — 43 items
APPLICATOR COTTON TIP 6IN STRL (MISCELLANEOUS) ×3 IMPLANT
BLADE EXTENDED COATED 6.5IN (ELECTRODE) IMPLANT
BLADE HEX COATED 2.75 (ELECTRODE) ×3 IMPLANT
BNDG GAUZE ELAST 4 BULKY (GAUZE/BANDAGES/DRESSINGS) ×2 IMPLANT
COVER MAYO STAND STRL (DRAPES) IMPLANT
COVER SURGICAL LIGHT HANDLE (MISCELLANEOUS) ×3 IMPLANT
DRAPE LAPAROSCOPIC ABDOMINAL (DRAPES) ×3 IMPLANT
DRAPE WARM FLUID 44X44 (DRAPE) ×2 IMPLANT
DRSG OPSITE POSTOP 4X8 (GAUZE/BANDAGES/DRESSINGS) ×2 IMPLANT
ELECT REM PT RETURN 9FT ADLT (ELECTROSURGICAL) ×3
ELECTRODE REM PT RTRN 9FT ADLT (ELECTROSURGICAL) ×1 IMPLANT
GAUZE SPONGE 4X4 12PLY STRL (GAUZE/BANDAGES/DRESSINGS) ×3 IMPLANT
GLOVE BIO SURGEON STRL SZ7.5 (GLOVE) ×6 IMPLANT
GLOVE BIOGEL PI IND STRL 7.0 (GLOVE) ×1 IMPLANT
GLOVE BIOGEL PI INDICATOR 7.0 (GLOVE) ×2
GOWN STRL REUS W/ TWL XL LVL3 (GOWN DISPOSABLE) ×1 IMPLANT
GOWN STRL REUS W/TWL LRG LVL3 (GOWN DISPOSABLE) ×3 IMPLANT
GOWN STRL REUS W/TWL XL LVL3 (GOWN DISPOSABLE) ×9 IMPLANT
HANDLE SUCTION POOLE (INSTRUMENTS) IMPLANT
KIT BASIN OR (CUSTOM PROCEDURE TRAY) ×3 IMPLANT
NS IRRIG 1000ML POUR BTL (IV SOLUTION) ×5 IMPLANT
PACK GENERAL/GYN (CUSTOM PROCEDURE TRAY) ×3 IMPLANT
PAD ABD 8X10 STRL (GAUZE/BANDAGES/DRESSINGS) ×2 IMPLANT
RELOAD PROXIMATE 75MM BLUE (ENDOMECHANICALS) ×6 IMPLANT
RELOAD STAPLE 75 3.8 BLU REG (ENDOMECHANICALS) IMPLANT
SPONGE LAP 18X18 X RAY DECT (DISPOSABLE) IMPLANT
STAPLER GUN LINEAR PROX 60 (STAPLE) ×2 IMPLANT
STAPLER PROXIMATE 75MM BLUE (STAPLE) ×2 IMPLANT
STAPLER VISISTAT 35W (STAPLE) ×3 IMPLANT
SUCTION POOLE HANDLE (INSTRUMENTS) ×3
SUT PDS AB 1 CTX 36 (SUTURE) IMPLANT
SUT PDS AB 1 TP1 96 (SUTURE) ×4 IMPLANT
SUT SILK 2 0 (SUTURE) ×3
SUT SILK 2 0 SH CR/8 (SUTURE) ×2 IMPLANT
SUT SILK 2-0 18XBRD TIE 12 (SUTURE) IMPLANT
SUT SILK 3 0 (SUTURE) ×3
SUT SILK 3 0 SH CR/8 (SUTURE) ×2 IMPLANT
SUT SILK 3-0 18XBRD TIE 12 (SUTURE) IMPLANT
TAPE CLOTH SURG 6X10 WHT LF (GAUZE/BANDAGES/DRESSINGS) ×2 IMPLANT
TOWEL OR 17X26 10 PK STRL BLUE (TOWEL DISPOSABLE) ×6 IMPLANT
TRAY FOLEY W/METER SILVER 16FR (SET/KITS/TRAYS/PACK) ×2 IMPLANT
WATER STERILE IRR 1500ML POUR (IV SOLUTION) ×1 IMPLANT
YANKAUER SUCT BULB TIP NO VENT (SUCTIONS) ×2 IMPLANT

## 2016-07-11 NOTE — Anesthesia Preprocedure Evaluation (Signed)
Anesthesia Evaluation  Patient identified by MRN, date of birth, ID band Patient awake    Reviewed: Allergy & Precautions, NPO status , Patient's Chart, lab work & pertinent test results  Airway Mallampati: III  TM Distance: >3 FB Neck ROM: Limited    Dental  (+) Dental Advisory Given   Pulmonary COPD, former smoker,  Metastatic pancreatic cancer to the lungs   breath sounds clear to auscultation       Cardiovascular + Peripheral Vascular Disease   Rhythm:Regular Rate:Normal     Neuro/Psych negative neurological ROS     GI/Hepatic Neg liver ROS, Small bowel perforation. Pancreatic CA on chemo   Endo/Other  diabetesHypothyroidism   Renal/GU Renal InsufficiencyRenal diseasenegative Renal ROS     Musculoskeletal  (+) Arthritis ,   Abdominal   Peds  Hematology  (+) Blood dyscrasia (Thrombocytopenia), ,   Anesthesia Other Findings   Reproductive/Obstetrics                             Lab Results  Component Value Date   WBC 26.8 (H) 07/10/2016   HGB 14.4 07/10/2016   HCT 40.4 07/10/2016   MCV 94.2 07/10/2016   PLT 102 (L) 07/10/2016   Lab Results  Component Value Date   CREATININE 1.28 (H) 07/10/2016   BUN 16 07/10/2016   NA 139 07/10/2016   K 3.6 07/10/2016   CL 104 07/10/2016   CO2 24 07/10/2016   Lab Results  Component Value Date   INR 1.09 07/11/2016   INR 1.03 01/08/2016    Anesthesia Physical Anesthesia Plan  ASA: IV and emergent  Anesthesia Plan: General   Post-op Pain Management:    Induction: Intravenous, Rapid sequence and Cricoid pressure planned  Airway Management Planned: Oral ETT and Video Laryngoscope Planned  Additional Equipment:   Intra-op Plan:   Post-operative Plan: Possible Post-op intubation/ventilation  Informed Consent: I have reviewed the patients History and Physical, chart, labs and discussed the procedure including the risks, benefits  and alternatives for the proposed anesthesia with the patient or authorized representative who has indicated his/her understanding and acceptance.   Dental advisory given  Plan Discussed with: CRNA  Anesthesia Plan Comments:         Anesthesia Quick Evaluation

## 2016-07-11 NOTE — ED Notes (Signed)
Notified EDP,Liu,MD., pt. I-stat CG4 lactic acid 2.04.

## 2016-07-11 NOTE — Anesthesia Procedure Notes (Signed)
Procedure Name: Intubation Date/Time: 07/11/2016 3:57 AM Performed by: Lissa Morales Pre-anesthesia Checklist: Patient identified, Emergency Drugs available, Suction available and Patient being monitored Patient Re-evaluated:Patient Re-evaluated prior to inductionOxygen Delivery Method: Circle system utilized Preoxygenation: Pre-oxygenation with 100% oxygen Intubation Type: IV induction Ventilation: Mask ventilation without difficulty Laryngoscope Size: 4 and Glidescope Grade View: Grade III Tube type: Oral Tube size: 8.0 (taper tube) mm Number of attempts: 1 Airway Equipment and Method: Stylet and Oral airway Placement Confirmation: ETT inserted through vocal cords under direct vision,  positive ETCO2 and breath sounds checked- equal and bilateral Secured at: 22 cm Tube secured with: Tape Dental Injury: Teeth and Oropharynx as per pre-operative assessment  Difficulty Due To: Difficulty was anticipated Comments: MAP 3, Elective  glidescope with good visualization.repetitive stress injury. Stomach decompressedprior to GA. HOB up 65 degrees

## 2016-07-11 NOTE — Transfer of Care (Signed)
Immediate Anesthesia Transfer of Care Note  Patient: Albert Richards  Procedure(s) Performed: Procedure(s): EXPLORATORY LAPAROTOMY (N/A)  Patient Location: PACU  Anesthesia Type:General  Level of Consciousness: awake, alert , oriented and patient cooperative  Airway & Oxygen Therapy: Patient Spontanous Breathing and Patient connected to face mask oxygen  Post-op Assessment: Report given to RN, Post -op Vital signs reviewed and stable and Patient moving all extremities X 4  Post vital signs: stable  Last Vitals:  Vitals:   07/11/16 0300 07/11/16 0522  BP: 118/74   Pulse: 106   Resp: 25   Temp:  (P) 37.4 C    Last Pain:  Vitals:   07/11/16 0130  PainSc: 10-Worst pain ever         Complications: No apparent anesthesia complications

## 2016-07-11 NOTE — Anesthesia Postprocedure Evaluation (Signed)
Anesthesia Post Note  Patient: Albert Richards  Procedure(s) Performed: Procedure(s) (LRB): EXPLORATORY LAPAROTOMY (N/A)  Patient location during evaluation: PACU Anesthesia Type: General Level of consciousness: awake and alert Pain management: pain level controlled Vital Signs Assessment: post-procedure vital signs reviewed and stable Respiratory status: spontaneous breathing, nonlabored ventilation, respiratory function stable and patient connected to nasal cannula oxygen Cardiovascular status: blood pressure returned to baseline and stable Postop Assessment: no signs of nausea or vomiting Anesthetic complications: no    Last Vitals:  Vitals:   07/11/16 0530 07/11/16 0545  BP: (!) 141/67 132/68  Pulse: 98 (!) 109  Resp: (!) 26 (!) 21  Temp:      Last Pain:  Vitals:   07/11/16 0550  PainSc: 8                  Tiajuana Amass

## 2016-07-11 NOTE — H&P (Addendum)
Albert Richards is an 75 y.o. male.   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 He recently received another dose of chemotherapy. On Friday he developed severe abdominal pain. The pain has gradually gotten worse over the last day or 2. He denies any fevers or chills. He has had some vomiting associated with it. He has not passed any flatus for the last couple days. He had a small bowel movement this morning. In the emergency department he underwent a CT scan that shows some free air and evidence of worsening of his metastatic disease.  Past Medical History:  Diagnosis Date  . Arthritis   . Bowel obstruction   . 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 Diabetes mellitus without complication ([place])   . History of kidney stones 10-12 yrs ago  . Hypothyroidism   . pancreatic ca dx'd 2014  . Pneumonia   . [place] spotted fever     Past Surgical History:  Procedure Laterality Date  . AMPUTATION Left 01/08/2016   Procedure: AMPUTATION REVISION LEFT FIRST FINGER ;  Surgeon: Charlotte Crumb, MD;  Location: [facility];  Service: Orthopedics;  Laterality: Left;  . EUS N/A 07/08/2013   Procedure: UPPER ENDOSCOPIC ULTRASOUND (EUS) LINEAR;  Surgeon: Milus Banister, MD;  Location: [facility];  Service: Endoscopy;  Laterality: N/A;  . I&D EXTREMITY Left 01/08/2016   Procedure: IRRIGATION AND DEBRIDEMENT LEFT THUMB;  Surgeon: Charlotte Crumb, MD;  Location: [place] [facility];  Service: Orthopedics;  Laterality: Left;  . PORTACATH PLACEMENT N/A 07/30/2013   Procedure: INSERTION PORT-A-CATH;  Surgeon: Stark Klein, MD;  Location: [place];  Service: General;  Laterality: N/A;  . TONSILLECTOMY  as child    History reviewed. No pertinent family history. Social History:  reports that he quit smoking about 10 years ago. His smoking use included Cigarettes. He started smoking about 60  years ago. He has a 50.00 pack-year smoking history. He has never used smokeless tobacco. He reports that he does not drink alcohol or use drugs.  Allergies: No Known Allergies   (Not in a hospital admission)  Results for orders placed or performed during the hospital encounter of 07/10/16 (from the past 48 hour(s))  Lipase, blood     Status: None   Collection Time: 07/10/16 10:06 PM  Result Value Ref Range   Lipase 19 11 - 51 U/L  Comprehensive metabolic panel     Status: Abnormal   Collection Time: 07/10/16 10:06 PM  Result Value Ref Range   Sodium 139 135 - 145 mmol/L   Potassium 3.6 3.5 - 5.1 mmol/L   Chloride 104 101 - 111 mmol/L   CO2 24 22 - 32 mmol/L   Glucose, Bld 219 (H) 65 - 99 mg/dL   BUN 16 6 - 20 mg/dL   Creatinine, Ser 1.28 (H) 0.61 - 1.24 mg/dL   Calcium 8.9 8.9 - 10.3 mg/dL   Total Protein 7.3 6.5 - 8.1 g/dL   Albumin 3.9 3.5 - 5.0 g/dL   AST 20 15 - 41 U/L   ALT 22 17 - 63 U/L   Alkaline Phosphatase 228 (H) 38 - 126 U/L   Total Bilirubin 0.5 0.3 - 1.2 mg/dL   GFR calc non Af Amer 53 (L) >60 mL/min   GFR calc Af Amer >60 >60 mL/min    Comment: (NOTE) The eGFR has  been calculated using the CKD EPI equation. This calculation has not been validated in all clinical situations. eGFR's persistently <60 mL/min signify possible Chronic Kidney Disease.    Anion gap 11 5 - 15  CBC     Status: Abnormal   Collection Time: 07/10/16 10:06 PM  Result Value Ref Range   WBC 26.8 (H) 4.0 - 10.5 K/uL   RBC 4.29 4.22 - 5.81 MIL/uL   Hemoglobin 14.4 13.0 - 17.0 g/dL   HCT 40.4 39.0 - 52.0 %   MCV 94.2 78.0 - 100.0 fL   MCH 33.6 26.0 - 34.0 pg   MCHC 35.6 30.0 - 36.0 g/dL   RDW 15.8 (H) 11.5 - 15.5 %   Platelets 102 (L) 150 - 400 K/uL  Differential     Status: Abnormal   Collection Time: 07/10/16 10:06 PM  Result Value Ref Range   Neutrophils Relative % 89 %   Lymphocytes Relative 7 %   Monocytes Relative 4 %   Eosinophils Relative 0 %   Basophils Relative 0 %    nRBC 1 (H) 0 /100 WBC   Neutro Abs 23.8 (H) 1.7 - 7.7 K/uL   Lymphs Abs 1.9 0.7 - 4.0 K/uL   Monocytes Absolute 1.1 (H) 0.1 - 1.0 K/uL   Eosinophils Absolute 0.0 0.0 - 0.7 K/uL   Basophils Absolute 0.0 0.0 - 0.1 K/uL   RBC Morphology RARE NRBCs    WBC Morphology WHITE COUNT CONFIRMED ON SMEAR    Smear Review PLATELET COUNT CONFIRMED BY SMEAR   Urinalysis, Routine w reflex microscopic     Status: Abnormal   Collection Time: 07/10/16 10:10 PM  Result Value Ref Range   Color, Urine YELLOW YELLOW   APPearance TURBID (A) CLEAR   Specific Gravity, Urine 1.026 1.005 - 1.030   pH 5.0 5.0 - 8.0   Glucose, UA NEGATIVE NEGATIVE mg/dL   Hgb urine dipstick NEGATIVE NEGATIVE   Bilirubin Urine NEGATIVE NEGATIVE   Ketones, ur NEGATIVE NEGATIVE mg/dL   Protein, ur 30 (A) NEGATIVE mg/dL   Nitrite NEGATIVE NEGATIVE   Leukocytes, UA NEGATIVE NEGATIVE   RBC / HPF 0-5 0 - 5 RBC/hpf   WBC, UA NONE SEEN 0 - 5 WBC/hpf   Bacteria, UA NONE SEEN NONE SEEN   Squamous Epithelial / LPF 0-5 (A) NONE SEEN   Mucous PRESENT    Amorphous Crystal PRESENT   I-Stat CG4 Lactic Acid, ED     Status: Abnormal   Collection Time: 07/10/16 10:21 PM  Result Value Ref Range   Lactic Acid, Venous 2.71 (HH) 0.5 - 1.9 mmol/L   Comment NOTIFIED PHYSICIAN   Type and screen [place]     Status: None (Preliminary result)   Collection Time: 07/11/16  1:45 AM  Result Value Ref Range   ABO/RH(D) A POS    Antibody Screen PENDING    Sample Expiration 07/14/2016   Protime-INR     Status: None   Collection Time: 07/11/16  1:45 AM  Result Value Ref Range   Prothrombin Time 14.1 11.4 - 15.2 seconds   INR 1.09   APTT     Status: None   Collection Time: 07/11/16  1:45 AM  Result Value Ref Range   aPTT 27 24 - 36 seconds  I-Stat CG4 Lactic Acid, ED     Status: Abnormal   Collection Time: 07/11/16  2:00 AM  Result Value Ref Range   Lactic Acid, Venous 2.04 (HH) 0.5 - 1.9 mmol/L  Comment NOTIFIED PHYSICIAN     Ct Abdomen Pelvis W Contrast  Result Date: 07/11/2016 CLINICAL DATA:  Severe abdominal pain, nausea and vomiting. History of metastatic pancreatic cancer, small bowel obstruction. Recurrent small bowel obstructions. EXAM: CT ABDOMEN AND PELVIS WITH CONTRAST TECHNIQUE: Multidetector CT imaging of the abdomen and pelvis was performed using the standard protocol following bolus administration of intravenous contrast. CONTRAST:  157m ISOVUE-300 IOPAMIDOL (ISOVUE-300) INJECTION 61% COMPARISON:  CT abdomen and pelvis May 21, 2016 and CT abdomen and pelvis December 29, 2015 FINDINGS: LOWER CHEST: Multiple pulmonary nodules in the lung bases, some of which are new or, were not included in the field of view on prior examination. Lower lobe atelectasis. Heart size is mildly enlarged. No pericardial effusions. HEPATOBILIARY: Liver and gallbladder are normal. PANCREAS: Atrophic pancreas without discrete mass. SPLEEN: Normal. ADRENALS/URINARY TRACT: Kidneys are orthotopic, demonstrating symmetric enhancement. No nephrolithiasis, hydronephrosis or solid renal masses. 8.2 cm RIGHT upper pole simple cyst, additional smaller bilateral renal cysts. Too small to characterize hypodensities in the kidneys bilaterally. The unopacified ureters are normal in course and caliber. Delayed imaging through the kidneys demonstrates symmetric prompt contrast excretion within the proximal urinary collecting system. Urinary bladder is decompressed and unremarkable. Normal adrenal glands. STOMACH/BOWEL: Multiple loops of thickened, matted small bowel LEFT abdomen with focal nodular thickening, small bowel feces and, 15 mm intramural nodule versus focal fluid collection. Multiple bubbles of gas/pneumatosis and distal duodenum and proximal jejunum. No hypoenhancing bowel to suggest necrosis/ischemic bowel. VASCULAR/LYMPHATIC: Aortoiliac vessels are normal in course and caliber, mild calcific atherosclerosis. Tiny mesenteric nodules in LEFT  abdomen could be metastatic. No lymphadenopathy by CT size criteria. REPRODUCTIVE: Mild prostatomegaly invading the base the urinary bladder. OTHER: Small amount of perihepatic free fluid and, small amount of ascites in the pelvis. Moderate amount of upper abdomen pneumoperitoneum particular in the LEFT upper quadrant. MUSCULOSKELETAL: Nonacute. Moderate bilateral fat containing inguinal hernias. Multiple hemangiomas. Increasing cons acuity a subcentimeter lucency T10 inferior endplate equivocal for osseous metastasis. No pathologic fracture. IMPRESSION: Pneumoperitoneum consistent with perforated small bowel obstruction, likely within distal duodenum/proximal jejunum. Associated matted , thickened small bowel LEFT abdomen highly concerning for neoplastic involvement. 15 mm small bowel intramural nodule LEFT abdomen versus focal collection. New small to moderate amount of ascites. Nodules within included lung bases consistent with metastatic disease. Increasing conspicuity of lytic lesion in the thoracic spine concerning for osseous metastasis. Acute findings discussed with and [facility] 07/11/2016 at 12:30 am. Electronically Signed   By: CElon AlasM.D.   On: 07/11/2016 00:39   Dg Abd Portable 1 View  Result Date: 07/10/2016 CLINICAL DATA:  Nasogastric tube placement. EXAM: PORTABLE ABDOMEN - 1 VIEW COMPARISON:  CT abdomen and pelvis May 21, 2016 FINDINGS: Nasogastric tube tip projects in distal esophagus. Included bowel gas pattern is nondilated and nonobstructive. IMPRESSION: Nasogastric tube tip projects in distal esophagus, recommend advancement. Electronically Signed   By: CElon AlasM.D.   On: 07/10/2016 23:41    Review of Systems  Constitutional: Negative.   HENT: Negative.   Eyes: Negative.   Respiratory: Negative.   Cardiovascular: Negative.   Gastrointestinal: Positive for abdominal pain, nausea and vomiting.  Genitourinary: Negative.    Musculoskeletal: Positive for back pain.  Skin: Negative.   Neurological: Negative.   Endo/Heme/Allergies: Negative.   Psychiatric/Behavioral: Negative.     Blood pressure 123/68, pulse 101, resp. rate 21, height '5\' 10"'  (1.778 m), weight 97.5 kg (215 lb), SpO2 95 %. Physical Exam  Constitutional: He is oriented to person, place, and time. He appears well-developed and well-nourished.  HENT:  Head: Normocephalic and atraumatic.  Eyes: Conjunctivae and EOM are normal. Pupils are equal, round, and reactive to light.  Neck: Normal range of motion. Neck supple.  Cardiovascular: Normal rate, regular rhythm and normal heart sounds.   Respiratory: Effort normal and breath sounds normal.  GI:  There is severe diffuse tenderness  Musculoskeletal: Normal range of motion.  Neurological: He is alert and oriented to person, place, and time.  Skin: Skin is warm and dry.  Psychiatric: He has a normal mood and affect. His behavior is normal.     Assessment/Plan  The patient appears to have a bowel obstruction which may be related to worsening metastatic disease in the abdominal cavity. I have had a long discussion with him and his family. Because of the risk of overwhelming sepsis of thank you would benefit from having an exploration tonight. Unfortunately given the worsening of his metastatic disease and his recent chemotherapy there is a possibility that he could not survive this hospitalization. There is also a very real possibility that he may not survive longer than the next few weeks to months. I have talked to him and his family in detail about all of this including the risks and benefits of surgery as well as some of the technical aspects and the possibility of needing to be on a breathing machine for an indefinite period of time and he understands and wishes to proceed  Merrie Roof, MD 07/11/2016, 2:39 AM

## 2016-07-11 NOTE — Progress Notes (Signed)
Awake in ICU.  Stable.  Will add Robaxin.

## 2016-07-11 NOTE — Op Note (Signed)
07/10/2016 - 07/11/2016  5:12 AM  PATIENT:  Albert Richards  75 y.o. male  PRE-OPERATIVE DIAGNOSIS:  Small Bowel Perferation  POST-OPERATIVE DIAGNOSIS:  SMALL BOWEL PERFORATION  PROCEDURE:  Procedure(s): EXPLORATORY LAPAROTOMY (N/A) with segmental small bowel resection  SURGEON:  Surgeon(s) and Role:    * Jovita Kussmaul, MD - Primary  PHYSICIAN ASSISTANT:   ASSISTANTS: none   ANESTHESIA:   general  EBL:  Total I/O In: 3051 [I.V.:2001; IV Piggyback:1050] Out: 275 [Urine:125; Blood:150]  BLOOD ADMINISTERED:none  DRAINS: none   LOCAL MEDICATIONS USED:  NONE  SPECIMEN:  Source of Specimen:  loop of small bowel with tumor and perforation  DISPOSITION OF SPECIMEN:  PATHOLOGY  COUNTS:  YES  TOURNIQUET:  * No tourniquets in log *  DICTATION: .Dragon Dictation   After informed consent was obtained the patient was brought to the operating room and placed in the supine position on the operating room table. After adequate induction of general anesthesia the patient's abdomen was prepped with ChloraPrep, allowed to dry, and draped in usual sterile manner. An appropriate timeout was performed. A midline incision was then made with a 10 blade knife. The incision was carried through the skin and subcutaneous tissue sharply with electrocautery until the linea alba was identified. The linea alba was also incised with the electrocautery. The preperitoneal space was then probed bluntly with a hemostat until the peritoneum was opened and access was gained to the abdominal cavity. The rest of the incision was opened under direct vision. There was a moderate amount of purulent material in the abdomen which was aspirated. The small bowel was run from the ligament of Treitz to the ileocecal valve. Along the midportion of the bowel there appeared to be a large area of tumor in the mesentery. A loop of small bowel was adherent to this with a small area of perforation in the middle. There were  multiple nodules in the wall of the small bowel and colon. This obstructed and perforated segment of small bowel appeared resectable. A site was chosen above and below the area of the tumor where the bowel appeared to be relatively healthy. The mesentery at each of these points was opened sharply with the electrocautery. A GIA-75 stapler is placed across the bowel at each of these points, clamped, and fired thereby dividing the bowel between staple lines. The mesentery to the perforated segment was then taken down sharply with the LigaSure. The main vessels in the mesentery were then oversewed with figure-of-eight 2-0 silk stitches. A small opening was then made on the antimesenteric edge of each limb of small bowel. Each limb of a GIA-75 stapler was then placed down the appropriate limb of small bowel, clamped, and fired thereby creating a nice widely patent enteroenterostomy. The common opening was closed with a TA 60 stapler. The staple line was then oversewn with interrupted 2-0 silk stitches and a 2-0 silk crotch stitch was also placed. The mesentery was then closed with interrupted 2-0 silk stitches. Once this was accomplished the anastomosis appeared widely patent and healthy. The abdomen was then irrigated with copious amounts of saline. The liver felt clean. The gallbladder was soft. The stomach was soft and the NG was in the appropriate position. The only abnormality noted were multiple nodules in the mesentery and wall of both the small bowel and colon. Next the fascia of the anterior abdominal wall was closed with 2 running #1 double-stranded loop PDS sutures. The subcutaneous tissue was packed with Kerlix  gauze and sterile dressings were applied. The patient tolerated the procedure well. At the end of the case all needle sponge and instrument counts were correct. The patient was then awakened and taken to recovery in stable condition.  PLAN OF CARE: Admit to inpatient   PATIENT DISPOSITION:  PACU -  hemodynamically stable.   Delay start of Pharmacological VTE agent (>24hrs) due to surgical blood loss or risk of bleeding: no

## 2016-07-11 NOTE — Progress Notes (Signed)
Pharmacy Antibiotic Note  Albert Richards is a 75 y.o. male with hx metastatic pancreatic cancer admitted on 07/10/2016 with severe abdominal pain.  CT showed free air and worsening metastatic disease with small bowel perforation.  Pt underwent s/p exploratory lap and segmental small bowel resection 12/14.  Pharmacy has been consulted for zosyn dosing.    SCr 1.28, CrCl ~58 ml/min.   Elevated WBC and LA NKDA  Plan: Zosyn 3.375g IV q8h (4 hour infusion).  Height: 5\' 10"  (177.8 cm) Weight: 215 lb (97.5 kg) IBW/kg (Calculated) : 73  Temp (24hrs), Avg:98.8 F (37.1 C), Min:98.3 F (36.8 C), Max:99.3 F (37.4 C)   Recent Labs Lab 07/10/16 2206 07/10/16 2221 07/11/16 0200  WBC 26.8*  --   --   CREATININE 1.28*  --   --   LATICACIDVEN  --  2.71* 2.04*    Estimated Creatinine Clearance: 57.5 mL/min (by C-G formula based on SCr of 1.28 mg/dL (H)).    No Known Allergies  Antimicrobials this admission: 12/14 Zosyn >>   Dose adjustments this admission:  Microbiology results:  Thank you for allowing pharmacy to be a part of this patient's care.  Ralene Bathe, PharmD, BCPS 07/11/2016, 7:07 AM  Pager: [phone]

## 2016-07-11 NOTE — Care Management Note (Signed)
Case Management Note  Patient Details  Name: Albert Richards MRN: [id number] Date of Birth: 25-Jul-1941  Subjective/Objective:     sb perforation with repair                Action/Plan:Date:  July 11, 2016 Chart reviewed for concurrent status and case management needs. Will continue to follow patient progress. Discharge Planning: following for needs Expected discharge date: [id number] Velva Harman, BSN, [place], [place]   Expected Discharge Date:   (unknown)               Expected Discharge Plan:  Home/Self Care  In-House Referral:     Discharge planning Services     Post Acute Care Choice:    Choice offered to:     DME Arranged:    DME Agency:     HH Arranged:    [place] Agency:     Status of Service:  In process, will continue to follow  If discussed at Long Length of Stay Meetings, dates discussed:    Additional Comments:  Leeroy Cha, RN 07/11/2016, 11:51 AM

## 2016-07-12 LAB — CBC
HCT: 32.8 % — ABNORMAL LOW (ref 39.0–52.0)
Hemoglobin: 11.4 g/dL — ABNORMAL LOW (ref 13.0–17.0)
MCH: 33.6 pg (ref 26.0–34.0)
MCHC: 34.8 g/dL (ref 30.0–36.0)
MCV: 96.8 fL (ref 78.0–100.0)
Platelets: 134 10*3/uL — ABNORMAL LOW (ref 150–400)
RBC: 3.39 MIL/uL — ABNORMAL LOW (ref 4.22–5.81)
RDW: 16.7 % — AB (ref 11.5–15.5)
WBC: 38.9 10*3/uL — AB (ref 4.0–10.5)

## 2016-07-12 LAB — GLUCOSE, CAPILLARY
GLUCOSE-CAPILLARY: 138 mg/dL — AB (ref 65–99)
GLUCOSE-CAPILLARY: 158 mg/dL — AB (ref 65–99)
GLUCOSE-CAPILLARY: 164 mg/dL — AB (ref 65–99)
GLUCOSE-CAPILLARY: 169 mg/dL — AB (ref 65–99)
GLUCOSE-CAPILLARY: 191 mg/dL — AB (ref 65–99)
Glucose-Capillary: 185 mg/dL — ABNORMAL HIGH (ref 65–99)

## 2016-07-12 LAB — BASIC METABOLIC PANEL
ANION GAP: 4 — AB (ref 5–15)
BUN: 23 mg/dL — ABNORMAL HIGH (ref 6–20)
CALCIUM: 7.6 mg/dL — AB (ref 8.9–10.3)
CO2: 25 mmol/L (ref 22–32)
CREATININE: 1.23 mg/dL (ref 0.61–1.24)
Chloride: 108 mmol/L (ref 101–111)
GFR, EST NON AFRICAN AMERICAN: 55 mL/min — AB (ref >60)
Glucose, Bld: 200 mg/dL — ABNORMAL HIGH (ref 65–99)
Potassium: 4.3 mmol/L (ref 3.5–5.1)
Sodium: 137 mmol/L (ref 135–145)

## 2016-07-12 MED ORDER — SODIUM CHLORIDE 0.9% FLUSH: 9.0000 mL | INTRAVENOUS | Status: DC | PRN

## 2016-07-12 MED ORDER — MORPHINE SULFATE 2 MG/ML IV SOLN
INTRAVENOUS | Status: DC
  Administered 2016-07-12: 3 mg via INTRAVENOUS
  Administered 2016-07-12: 10.5 mg via INTRAVENOUS
  Administered 2016-07-12: 14:00:00 via INTRAVENOUS
  Administered 2016-07-13: 1.5 mg via INTRAVENOUS
  Administered 2016-07-13: 1 mg via INTRAVENOUS
  Administered 2016-07-13 (×2): 3 mg via INTRAVENOUS
  Administered 2016-07-13: 1.5 mg via INTRAVENOUS
  Administered 2016-07-14 (×2): 1 mg via INTRAVENOUS

## 2016-07-12 MED ORDER — METHOCARBAMOL 1000 MG/10ML IJ SOLN
750.0000 mg | Freq: Three times a day (TID) | INTRAVENOUS | Status: DC
  Administered 2016-07-12 – 2016-07-26 (×41): 750 mg via INTRAVENOUS
  Filled 2016-07-12 (×21): qty 7.5
  Filled 2016-07-12: qty 575
  Filled 2016-07-12 (×19): qty 7.5
  Filled 2016-07-12: qty 575
  Filled 2016-07-12 (×3): qty 7.5

## 2016-07-12 MED ORDER — DIPHENHYDRAMINE HCL 50 MG/ML IJ SOLN: 12.5000 mg | Freq: Four times a day (QID) | INTRAMUSCULAR | Status: DC | PRN

## 2016-07-12 MED ORDER — ORAL CARE MOUTH RINSE
15.0000 mL | Freq: Two times a day (BID) | OROMUCOSAL | Status: DC
  Administered 2016-07-12 – 2016-07-26 (×20): 15 mL via OROMUCOSAL

## 2016-07-12 MED ORDER — DIPHENHYDRAMINE HCL 12.5 MG/5ML PO ELIX: 12.5000 mg | ORAL_SOLUTION | Freq: Four times a day (QID) | ORAL | Status: DC | PRN

## 2016-07-12 MED ORDER — NALOXONE HCL 0.4 MG/ML IJ SOLN: 0.4000 mg | INTRAMUSCULAR | Status: DC | PRN

## 2016-07-12 MED ORDER — ONDANSETRON HCL 4 MG/2ML IJ SOLN: 4.0000 mg | Freq: Four times a day (QID) | INTRAMUSCULAR | Status: DC | PRN

## 2016-07-12 NOTE — Progress Notes (Signed)
[facility] Progress Note  1 Day Post-Op  Subjective: Wife and nursing staff at bedside. Patient reports intermittent cramping abdominal pain around his incision that he rates as a 6/10 at rest and 20/10 with movement. Denies flatus or BM. Sat up in a chair for 1 hour yesterday. Pulling <500 cc on IS.  Objective: Vital signs in last 24 hours: Temp:  [97.7 F (36.5 C)-98.9 F (37.2 C)] 97.7 F (36.5 C) (12/15 0400) Pulse Rate:  [52-111] 92 (12/15 0611) Resp:  [16-33] 19 (12/15 0611) BP: (99-145)/(53-79) 145/68 (12/15 0611) SpO2:  [88 %-98 %] 97 % (12/15 0611)    Intake/Output from previous day: 12/14 0701 - 12/15 0700 In: 2496.7 [I.V.:2191.7; IV Piggyback:305] Out: 980 [Urine:780; Emesis/NG output:200] Intake/Output this shift: No intake/output data recorded.  PE: Gen: alert, oriented, in NAD, cooperative HEENT: NGT in right nare with <300 cc bilious output in cannister, mouth is dry CV: pedal pulses 2+ bilateral Pulm: CTABL, unlabored Abdomen: soft, appropriately tender, midline incision c/d/i - dressing taken down and wound healing appropriately, mostly granulation tissue. Would base is clean and dry. Wet-to-dry dressing replaced.  GU: foley in place Ext: peripheral neuropathy of bilateral lower extremities   Lab Results:   Recent Labs  07/11/16 0834 07/12/16 0429  WBC 46.4* 38.9*  HGB 12.5* 11.4*  HCT 36.3* 32.8*  PLT 112* 134*   BMET  Recent Labs  07/11/16 0834 07/12/16 0429  NA 140 137  K 4.3 4.3  CL 108 108  CO2 24 25  GLUCOSE 241* 200*  BUN 17 23*  CREATININE 1.29* 1.23  CALCIUM 7.5* 7.6*   PT/INR  Recent Labs  07/11/16 0145  LABPROT 14.1  INR 1.09   CMP     Component Value Date/Time   NA 137 07/12/2016 0429   NA 140 04/08/2016 1112   K 4.3 07/12/2016 0429   K 4.3 04/08/2016 1112   CL 108 07/12/2016 0429   CO2 25 07/12/2016 0429   CO2 26 04/08/2016 1112   GLUCOSE 200 (H) 07/12/2016 0429   GLUCOSE 137 04/08/2016 1112    BUN 23 (H) 07/12/2016 0429   BUN 16.8 04/08/2016 1112   CREATININE 1.23 07/12/2016 0429   CREATININE 1.1 04/08/2016 1112   CALCIUM 7.6 (L) 07/12/2016 0429   CALCIUM 9.3 04/08/2016 1112   PROT 7.3 07/10/2016 2206   PROT 6.7 07/20/2013 1432   ALBUMIN 3.9 07/10/2016 2206   ALBUMIN 3.4 (L) 07/20/2013 1432   AST 20 07/10/2016 2206   AST 15 07/20/2013 1432   ALT 22 07/10/2016 2206   ALT 15 07/20/2013 1432   ALKPHOS 228 (H) 07/10/2016 2206   ALKPHOS 81 07/20/2013 1432   BILITOT 0.5 07/10/2016 2206   BILITOT 0.40 07/20/2013 1432   GFRNONAA 55 (L) 07/12/2016 0429   GFRAA >60 07/12/2016 0429   Lipase     Component Value Date/Time   LIPASE 19 07/10/2016 2206       Studies/Results: Ct Abdomen Pelvis W Contrast  Result Date: 07/11/2016 CLINICAL DATA:  Severe abdominal pain, nausea and vomiting. History of metastatic pancreatic cancer, small bowel obstruction. Recurrent small bowel obstructions. EXAM: CT ABDOMEN AND PELVIS WITH CONTRAST TECHNIQUE: Multidetector CT imaging of the abdomen and pelvis was performed using the standard protocol following bolus administration of intravenous contrast. CONTRAST:  148mL ISOVUE-300 IOPAMIDOL (ISOVUE-300) INJECTION 61% COMPARISON:  CT abdomen and pelvis May 21, 2016 and CT abdomen and pelvis December 29, 2015 FINDINGS: LOWER CHEST: Multiple pulmonary nodules in the lung bases, some  of which are new or, were not included in the field of view on prior examination. Lower lobe atelectasis. Heart size is mildly enlarged. No pericardial effusions. HEPATOBILIARY: Liver and gallbladder are normal. PANCREAS: Atrophic pancreas without discrete mass. SPLEEN: Normal. ADRENALS/URINARY TRACT: Kidneys are orthotopic, demonstrating symmetric enhancement. No nephrolithiasis, hydronephrosis or solid renal masses. 8.2 cm RIGHT upper pole simple cyst, additional smaller bilateral renal cysts. Too small to characterize hypodensities in the kidneys bilaterally. The unopacified  ureters are normal in course and caliber. Delayed imaging through the kidneys demonstrates symmetric prompt contrast excretion within the proximal urinary collecting system. Urinary bladder is decompressed and unremarkable. Normal adrenal glands. STOMACH/BOWEL: Multiple loops of thickened, matted small bowel LEFT abdomen with focal nodular thickening, small bowel feces and, 15 mm intramural nodule versus focal fluid collection. Multiple bubbles of gas/pneumatosis and distal duodenum and proximal jejunum. No hypoenhancing bowel to suggest necrosis/ischemic bowel. VASCULAR/LYMPHATIC: Aortoiliac vessels are normal in course and caliber, mild calcific atherosclerosis. Tiny mesenteric nodules in LEFT abdomen could be metastatic. No lymphadenopathy by CT size criteria. REPRODUCTIVE: Mild prostatomegaly invading the base the urinary bladder. OTHER: Small amount of perihepatic free fluid and, small amount of ascites in the pelvis. Moderate amount of upper abdomen pneumoperitoneum particular in the LEFT upper quadrant. MUSCULOSKELETAL: Nonacute. Moderate bilateral fat containing inguinal hernias. Multiple hemangiomas. Increasing cons acuity a subcentimeter lucency T10 inferior endplate equivocal for osseous metastasis. No pathologic fracture. IMPRESSION: Pneumoperitoneum consistent with perforated small bowel obstruction, likely within distal duodenum/proximal jejunum. Associated matted , thickened small bowel LEFT abdomen highly concerning for neoplastic involvement. 15 mm small bowel intramural nodule LEFT abdomen versus focal collection. New small to moderate amount of ascites. Nodules within included lung bases consistent with metastatic disease. Increasing conspicuity of lytic lesion in the thoracic spine concerning for osseous metastasis. Acute findings discussed with and reconfirmed by [facility] on 07/11/2016 at 12:30 am. Electronically Signed   By: Elon Alas M.D.   On: 07/11/2016 00:39   Dg Abd Portable 1  View  Result Date: 07/10/2016 CLINICAL DATA:  Nasogastric tube placement. EXAM: PORTABLE ABDOMEN - 1 VIEW COMPARISON:  CT abdomen and pelvis May 21, 2016 FINDINGS: Nasogastric tube tip projects in distal esophagus. Included bowel gas pattern is nondilated and nonobstructive. IMPRESSION: Nasogastric tube tip projects in distal esophagus, recommend advancement. Electronically Signed   By: Elon Alas M.D.   On: 07/10/2016 23:41    Anti-infectives: Anti-infectives    Start     Dose/Rate Route Frequency Ordered Stop   07/11/16 1000  piperacillin-tazobactam (ZOSYN) IVPB 3.375 g     3.375 g 12.5 mL/hr over 240 Minutes Intravenous Every 8 hours 07/11/16 0705     07/11/16 0030  piperacillin-tazobactam (ZOSYN) IVPB 3.375 g     3.375 g 12.5 mL/hr over 240 Minutes Intravenous  Once 07/11/16 0028 07/11/16 0244     Assessment/Plan Small bowel perforation in the setting of metastatic pancreatic adenocarcinoma S/P EXPLORATORY LAPAROTOMY, SEGMENTAL SMALL BOWEL RESECTION, 07/11/16 Dr. Autumn Messing III  POD#1  Surgical pathology pending  HR 94 bpm, afebrile, VSS  WBC 38.9 from 46.4   NGT 200 cc in over 24 h, bilious   Pancreatic cancer- last dose chemotherapy 07/02/16 Diabetes Mellitus - Moderate SSI Hypothyroidism - levothyroxine 125 mcg, currently held due to NPO status COPD  FEN: NPO, IVF - limited ice chips for comfort ID: Zosyn VTE: heparin/ SCD's  Plan: IV abx, await bowel function, d/c foley, PT eval CBC/BMET in AM Transfer to floor tomorrow; will discuss  with MD      LOS: 1 day    Jill Alexanders , [facility] 07/12/2016, 7:44 AM Pager: [phone] Consults: [phone] Mon-Fri 7:00 am-4:30 pm Sat-Sun 7:00 am-11:30 am

## 2016-07-13 ENCOUNTER — Encounter (HOSPITAL_COMMUNITY): Payer: Self-pay | Admitting: Surgery

## 2016-07-13 LAB — BASIC METABOLIC PANEL
Anion gap: 7 (ref 5–15)
BUN: 17 mg/dL (ref 6–20)
CALCIUM: 7.9 mg/dL — AB (ref 8.9–10.3)
CO2: 25 mmol/L (ref 22–32)
Chloride: 108 mmol/L (ref 101–111)
Creatinine, Ser: 1.06 mg/dL (ref 0.61–1.24)
GFR calc Af Amer: >60 mL/min (ref >60)
GFR calc non Af Amer: >60 mL/min (ref >60)
Glucose, Bld: 171 mg/dL — ABNORMAL HIGH (ref 65–99)
Potassium: 4.3 mmol/L (ref 3.5–5.1)
SODIUM: 140 mmol/L (ref 135–145)

## 2016-07-13 LAB — CBC
HCT: 30.1 % — ABNORMAL LOW (ref 39.0–52.0)
Hemoglobin: 10.3 g/dL — ABNORMAL LOW (ref 13.0–17.0)
MCH: 33.3 pg (ref 26.0–34.0)
MCHC: 34.2 g/dL (ref 30.0–36.0)
MCV: 97.4 fL (ref 78.0–100.0)
PLATELETS: 165 10*3/uL (ref 150–400)
RBC: 3.09 MIL/uL — ABNORMAL LOW (ref 4.22–5.81)
RDW: 17.2 % — AB (ref 11.5–15.5)
WBC: 32.6 10*3/uL — ABNORMAL HIGH (ref 4.0–10.5)

## 2016-07-13 LAB — GLUCOSE, CAPILLARY
GLUCOSE-CAPILLARY: 107 mg/dL — AB (ref 65–99)
GLUCOSE-CAPILLARY: 132 mg/dL — AB (ref 65–99)
GLUCOSE-CAPILLARY: 143 mg/dL — AB (ref 65–99)
Glucose-Capillary: 147 mg/dL — ABNORMAL HIGH (ref 65–99)
Glucose-Capillary: 144 mg/dL — ABNORMAL HIGH (ref 65–99)

## 2016-07-13 MED ORDER — ALUM & MAG HYDROXIDE-SIMETH 200-200-20 MG/5ML PO SUSP: 30.0000 mL | Freq: Four times a day (QID) | ORAL | Status: DC | PRN

## 2016-07-13 MED ORDER — BISACODYL 10 MG RE SUPP
10.0000 mg | Freq: Every day | RECTAL | Status: DC
  Administered 2016-07-13 – 2016-07-25 (×8): 10 mg via RECTAL
  Filled 2016-07-13 (×10): qty 1

## 2016-07-13 MED ORDER — SODIUM CHLORIDE 0.9 % IV SOLN
25.0000 mg | Freq: Four times a day (QID) | INTRAVENOUS | Status: DC | PRN
  Filled 2016-07-13: qty 1

## 2016-07-13 MED ORDER — LIP MEDEX EX OINT
1.0000 "application " | TOPICAL_OINTMENT | Freq: Two times a day (BID) | CUTANEOUS | Status: DC
  Administered 2016-07-15 – 2016-07-26 (×18): 1 via TOPICAL
  Filled 2016-07-13: qty 7

## 2016-07-13 MED ORDER — LACTATED RINGERS IV SOLN
INTRAVENOUS | Status: DC
  Administered 2016-07-13 – 2016-07-16 (×3): via INTRAVENOUS
  Administered 2016-07-17 – 2016-07-19 (×2): 50 mL/h via INTRAVENOUS

## 2016-07-13 MED ORDER — MENTHOL 3 MG MT LOZG
1.0000 | LOZENGE | OROMUCOSAL | Status: DC | PRN
  Administered 2016-07-13: 3 mg via ORAL
  Filled 2016-07-13: qty 9

## 2016-07-13 MED ORDER — LACTATED RINGERS IV BOLUS (SEPSIS): 1000.0000 mL | Freq: Three times a day (TID) | INTRAVENOUS | Status: AC | PRN

## 2016-07-13 MED ORDER — KETOROLAC TROMETHAMINE 15 MG/ML IJ SOLN
7.5000 mg | Freq: Three times a day (TID) | INTRAMUSCULAR | Status: AC
  Administered 2016-07-13 – 2016-07-15 (×6): 7.5 mg via INTRAVENOUS
  Filled 2016-07-13 (×5): qty 1

## 2016-07-13 MED ORDER — PHENOL 1.4 % MT LIQD
2.0000 | OROMUCOSAL | Status: DC | PRN
  Administered 2016-07-13: 2 via OROMUCOSAL
  Filled 2016-07-13: qty 177

## 2016-07-13 MED ORDER — METOPROLOL TARTRATE 5 MG/5ML IV SOLN: 5.0000 mg | Freq: Four times a day (QID) | INTRAVENOUS | Status: DC | PRN

## 2016-07-13 MED ORDER — LEVOTHYROXINE SODIUM 100 MCG IV SOLR
75.0000 ug | Freq: Every day | INTRAVENOUS | Status: DC
  Administered 2016-07-13 – 2016-07-14 (×2): 75 ug via INTRAVENOUS
  Filled 2016-07-13 (×3): qty 5

## 2016-07-13 MED ORDER — MAGIC MOUTHWASH
15.0000 mL | Freq: Four times a day (QID) | ORAL | Status: DC | PRN
  Filled 2016-07-13: qty 15

## 2016-07-13 NOTE — Evaluation (Signed)
Physical Therapy Evaluation Patient Details Name: Albert Richards MRN: [id number] DOB: 05-18-1941 Today's Date: 07/13/2016   History of Present Illness  The patient is a 75 year old white male who has a history of metastatic pancreatic cancer for the last 3 years, adm with severe abd pain;  s/p EXPLORATORY LAPAROTOMY  with segmental small bowel resection on 07/11/16;  Clinical Impression  Pt admitted with above diagnosis. Pt currently with functional limitations due to the deficits listed below (see PT Problem List).  Pt will benefit from skilled PT to increase their independence and safety with mobility to allow discharge to the venue listed below.   Pt agreeable to PT but wife VERY upset, discouraging pt from moving at all; when asked if she and pt would prefer we not try OOB--wife stated she was "not saying no to that"; pt did well, amb ~34' with RW, could have likely amb  further if family was more supportive; will continue to follow; VSS during session, O2 sats >91% on RA, O2 replaced end of session    Follow Up Recommendations Home health PT    Equipment Recommendations  Rolling walker with 5" wheels    Recommendations for Other Services       Precautions / Restrictions Precautions Precautions: Fall Precaution Comments: abd incision/wound, NGT Restrictions Weight Bearing Restrictions: No      Mobility  Bed Mobility Overal bed mobility: Needs Assistance Bed Mobility: Rolling;Sidelying to Sit;Sit to Sidelying Rolling: Min assist Sidelying to sit: Min assist     Sit to sidelying: Min assist;Mod assist General bed mobility comments: incr time, cues to log roll; assist to bring LEs on/of bed and light assist to bring trunk to upright  Transfers Overall transfer level: Needs assistance Equipment used: Rolling walker (2 wheeled) Transfers: Sit to/from Stand Sit to Stand: Min assist         General transfer comment: cues for hand placement and use of LEs to power  up/contorl descent  Ambulation/Gait        amb 65' with min to min/guard assist and use of RW;        Stairs            Wheelchair Mobility    Modified Rankin (Stroke Patients Only)       Balance Overall balance assessment: Needs assistance Sitting-balance support: Feet supported;No upper extremity supported Sitting balance-Leahy Scale: Fair       Standing balance-Leahy Scale: Fair                               Pertinent Vitals/Pain Pain Assessment: 0-10 Pain Location: abd Pain Descriptors / Indicators: Sore Pain Intervention(s): Limited activity within patient's tolerance;Monitored during session;PCA encouraged    Home Living Family/patient expects to be discharged to:: Private residence Living Arrangements: Spouse/significant other Available Help at Discharge: Family Type of Home: House Home Access: Stairs to enter Entrance Stairs-Rails: Right Entrance Stairs-Number of Steps: 6 Home Layout: One level Home Equipment: None      Prior Function Level of Independence: Independent               Hand Dominance        Extremity/Trunk Assessment   Upper Extremity Assessment Upper Extremity Assessment: Defer to OT evaluation;Overall [facility] for tasks assessed    Lower Extremity Assessment Lower Extremity Assessment: Overall WFL for tasks assessed (moves through ROM slowly but grossly [facility])       Communication   Communication:  No difficulties  Cognition Arousal/Alertness: Awake/alert Behavior During Therapy: WFL for tasks assessed/performed Overall Cognitive Status: Within Functional Limits for tasks assessed                      General Comments      Exercises     Assessment/Plan    PT Assessment Patient needs continued PT services  PT Problem List Decreased strength;Decreased activity tolerance;Decreased mobility;Decreased knowledge of use of DME;Pain          PT Treatment Interventions DME instruction;Gait  training;Functional mobility training;Therapeutic exercise;Therapeutic activities;Patient/family education    PT Goals (Current goals can be found in the Care Plan section)  Acute Rehab PT Goals Patient Stated Goal: home PT Goal Formulation: With patient/family Time For Goal Achievement: 07/20/16 Potential to Achieve Goals: Good    Frequency Min 3X/week   Barriers to discharge        Co-evaluation               End of Session   Activity Tolerance: Patient tolerated treatment well Patient left: in bed;with call bell/phone within reach;with family/visitor present;with bed alarm set           Time: 1610-9604 PT Time Calculation (min) (ACUTE ONLY): 24 min   Charges:   PT Evaluation $PT Eval Moderate Complexity: 1 Procedure PT Treatments $Gait Training: 8-22 mins   PT G Codes:        Allyn Bartelson 07/21/16, 4:53 PM

## 2016-07-13 NOTE — Progress Notes (Signed)
[facility]  [address] [place], [place] [address] Phone: [phone] FAX: [phone]   Albert Richards [id number] [birth year]    Problem List:   Principal Problem:   Small bowel perforation s/p SB resection 07/11/2016 Active Problems:   Stage IV adenocarcinoma of pancreas (HCC)   COPD (chronic obstructive pulmonary disease) ([place])   Diabetes mellitus ([place])   2 Days Post-Op  07/11/2016  5:12 AM  PATIENT:  Albert Richards  75 y.o. male  PRE-OPERATIVE DIAGNOSIS:  Small Bowel Perferation  POST-OPERATIVE DIAGNOSIS:  SMALL BOWEL PERFORATION  PROCEDURE:  Procedure(s): EXPLORATORY LAPAROTOMY (N/A) with segmental small bowel resection  SURGEON:  Surgeon(s) and Role:    * Autumn Messing III, MD - Primary  Assessment  Recovering  Plan:  -IV ABx x 5 d per peritonitis protocol -wean IVF w PRN backup -f/u pathology -pain control.  RTC toradol & erobaxin x 2 d.  PCA -NGT until flatus, then clamping trial.  Sips/ice OK -DM control - stop D5.  Cont SSI -hypothyroidism - give levothyroxine IV with Ileus -VTE prophylaxis- SCDs, etc -mobilize as tolerated to help recovery - get him up! PT/OT evals   I updated the patient's status to the patient, spouse, and nurse.  Recommendations were made.  Questions were answered.  They expressed understanding & appreciation.   Albert Richards, M.D., F.A.C.S. Gastrointestinal and Minimally Invasive Surgery [facility] [address] [place], [place] [address] [phone] Main / Paging   07/13/2016  CARE TEAM:  PCP: [name], MD  Outpatient Care Team: Patient Care Team: [name], MD as PCP - General (Internal Medicine)  Inpatient Treatment Team: Treatment Team: Attending Provider: Nolon Nations, MD; Attending Physician: Nolon Nations, MD; Registered Nurse: Tylene Fantasia, RN; Technician: Tenna Child, NT; Physical Therapist: Neil Crouch, PT; Registered Nurse: Celedonio Savage, RN; Technician: Macy Junita Push, NT  Subjective:  Sore but PCA helps C/o dry moith - wants liquids Nausea w using IS Up in chair Wife in room RN in room  Objective:  Vital signs:  Vitals:   07/13/16 0227 07/13/16 0414 07/13/16 0542 07/13/16 0749  BP: 112/65  136/78   Pulse: 84  83   Resp: '20 19 15 '$ (!) 23  Temp: 98.7 F (37.1 C)  98.3 F (36.8 C)   TempSrc: Oral  Oral   SpO2: 95% 95% 96% 94%  Weight:      Height:        Last BM Date: 07/09/16  Intake/Output   Yesterday:  12/15 0701 - 12/16 0700 In: 2515 [I.V.:2300; IV Piggyback:215] Out: 1100 [Urine:750; Emesis/NG output:350] This shift:  No intake/output data recorded.  Bowel function:  Flatus: No  BM:  No  Drain: Bilious - thin   Physical Exam:  General: Pt awake/alert/oriented x4 in No acute distress Eyes: PERRL, normal EOM.  Sclera clear.  No icterus Neuro: CN II-XII intact w/o focal sensory/motor deficits. Lymph: No head/neck/groin lymphadenopathy Psych:  No delerium/psychosis/paranoia HENT: Normocephalic, Mucus membranes moist.  No thrush Neck: Supple, No tracheal deviation Chest: No chest wall pain w good excursion CV:  Pulses intact.  Regular rhythm MS: Normal AROM mjr joints.  No obvious deformity Abdomen: Somewhat firm.  Moderately distended.  Mildly tender at incisions only.  No evidence of peritonitis.  No incarcerated hernias. Ext:  SCDs BLE.  No mjr edema.  No cyanosis Skin: No petechiae / purpura  Results:   Labs: Results for  orders placed or performed during the hospital encounter of 07/10/16 (from the past 48 hour(s))  Glucose, capillary     Status: Abnormal   Collection Time: 07/11/16 11:51 AM  Result Value Ref Range   Glucose-Capillary 230 (H) 65 - 99 mg/dL  MRSA PCR Screening     Status: None   Collection Time: 07/11/16 12:45 PM  Result Value Ref Range   MRSA by PCR NEGATIVE NEGATIVE    Comment:        The GeneXpert MRSA  Assay (FDA approved for NASAL specimens only), is one component of a comprehensive MRSA colonization surveillance program. It is not intended to diagnose MRSA infection nor to guide or monitor treatment for MRSA infections.   Glucose, capillary     Status: Abnormal   Collection Time: 07/11/16  3:23 PM  Result Value Ref Range   Glucose-Capillary 231 (H) 65 - 99 mg/dL  Glucose, capillary     Status: Abnormal   Collection Time: 07/11/16  8:46 PM  Result Value Ref Range   Glucose-Capillary 250 (H) 65 - 99 mg/dL  Glucose, capillary     Status: Abnormal   Collection Time: 07/12/16 12:42 AM  Result Value Ref Range   Glucose-Capillary 191 (H) 65 - 99 mg/dL   Comment 1 Notify RN    Comment 2 Document in Chart   Glucose, capillary     Status: Abnormal   Collection Time: 07/12/16  4:08 AM  Result Value Ref Range   Glucose-Capillary 185 (H) 65 - 99 mg/dL   Comment 1 Notify RN    Comment 2 Document in Chart   CBC     Status: Abnormal   Collection Time: 07/12/16  4:29 AM  Result Value Ref Range   WBC 38.9 (H) 4.0 - 10.5 K/uL   RBC 3.39 (L) 4.22 - 5.81 MIL/uL   Hemoglobin 11.4 (L) 13.0 - 17.0 g/dL   HCT 32.8 (L) 39.0 - 52.0 %   MCV 96.8 78.0 - 100.0 fL   MCH 33.6 26.0 - 34.0 pg   MCHC 34.8 30.0 - 36.0 g/dL   RDW 16.7 (H) 11.5 - 15.5 %   Platelets 134 (L) 150 - 400 K/uL  Basic metabolic panel     Status: Abnormal   Collection Time: 07/12/16  4:29 AM  Result Value Ref Range   Sodium 137 135 - 145 mmol/L   Potassium 4.3 3.5 - 5.1 mmol/L   Chloride 108 101 - 111 mmol/L   CO2 25 22 - 32 mmol/L   Glucose, Bld 200 (H) 65 - 99 mg/dL   BUN 23 (H) 6 - 20 mg/dL   Creatinine, Ser 1.23 0.61 - 1.24 mg/dL   Calcium 7.6 (L) 8.9 - 10.3 mg/dL   GFR calc non Af Amer 55 (L) >60 mL/min   GFR calc Af Amer >60 >60 mL/min    Comment: (NOTE) The eGFR has been calculated using the CKD EPI equation. This calculation has not been validated in all clinical situations. eGFR's persistently <60 mL/min  signify possible Chronic Kidney Disease.    Anion gap 4 (L) 5 - 15  Glucose, capillary     Status: Abnormal   Collection Time: 07/12/16  8:05 AM  Result Value Ref Range   Glucose-Capillary 164 (H) 65 - 99 mg/dL   Comment 1 Notify RN    Comment 2 Document in Chart   Glucose, capillary     Status: Abnormal   Collection Time: 07/12/16  1:21 PM  Result Value Ref  Range   Glucose-Capillary 169 (H) 65 - 99 mg/dL   Comment 1 Notify RN    Comment 2 Document in Chart   Glucose, capillary     Status: Abnormal   Collection Time: 07/12/16  6:18 PM  Result Value Ref Range   Glucose-Capillary 158 (H) 65 - 99 mg/dL  Glucose, capillary     Status: Abnormal   Collection Time: 07/12/16  7:39 PM  Result Value Ref Range   Glucose-Capillary 138 (H) 65 - 99 mg/dL  Glucose, capillary     Status: Abnormal   Collection Time: 07/13/16 12:10 AM  Result Value Ref Range   Glucose-Capillary 132 (H) 65 - 99 mg/dL  Glucose, capillary     Status: Abnormal   Collection Time: 07/13/16  4:09 AM  Result Value Ref Range   Glucose-Capillary 143 (H) 65 - 99 mg/dL  CBC     Status: Abnormal   Collection Time: 07/13/16  6:49 AM  Result Value Ref Range   WBC 32.6 (H) 4.0 - 10.5 K/uL   RBC 3.09 (L) 4.22 - 5.81 MIL/uL   Hemoglobin 10.3 (L) 13.0 - 17.0 g/dL   HCT 30.1 (L) 39.0 - 52.0 %   MCV 97.4 78.0 - 100.0 fL   MCH 33.3 26.0 - 34.0 pg   MCHC 34.2 30.0 - 36.0 g/dL   RDW 17.2 (H) 11.5 - 15.5 %   Platelets 165 150 - 400 K/uL  Basic metabolic panel     Status: Abnormal   Collection Time: 07/13/16  6:49 AM  Result Value Ref Range   Sodium 140 135 - 145 mmol/L   Potassium 4.3 3.5 - 5.1 mmol/L   Chloride 108 101 - 111 mmol/L   CO2 25 22 - 32 mmol/L   Glucose, Bld 171 (H) 65 - 99 mg/dL   BUN 17 6 - 20 mg/dL   Creatinine, Ser 1.06 0.61 - 1.24 mg/dL   Calcium 7.9 (L) 8.9 - 10.3 mg/dL   GFR calc non Af Amer >60 >60 mL/min   GFR calc Af Amer >60 >60 mL/min    Comment: (NOTE) The eGFR has been calculated using the  CKD EPI equation. This calculation has not been validated in all clinical situations. eGFR's persistently <60 mL/min signify possible Chronic Kidney Disease.    Anion gap 7 5 - 15  Glucose, capillary     Status: Abnormal   Collection Time: 07/13/16  7:37 AM  Result Value Ref Range   Glucose-Capillary 147 (H) 65 - 99 mg/dL    Imaging / Studies: No results found.  Medications / Allergies: per chart  Antibiotics: Anti-infectives    Start     Dose/Rate Route Frequency Ordered Stop   07/11/16 1000  piperacillin-tazobactam (ZOSYN) IVPB 3.375 g     3.375 g 12.5 mL/hr over 240 Minutes Intravenous Every 8 hours 07/11/16 0705     07/11/16 0030  piperacillin-tazobactam (ZOSYN) IVPB 3.375 g     3.375 g 12.5 mL/hr over 240 Minutes Intravenous  Once 07/11/16 0028 07/11/16 0244        Note: Portions of this report may have been transcribed using voice recognition software. Every effort was made to ensure accuracy; however, inadvertent computerized transcription errors may be present.   Any transcriptional errors that result from this process are unintentional.     Albert Richards, M.D., F.A.C.S. Gastrointestinal and Minimally Invasive Surgery [facility] [address], [place], [place] [address] [phone] Main / Paging  07/13/2016

## 2016-07-13 NOTE — Progress Notes (Signed)
Pharmacy Antibiotic Note  Albert Richards is a 75 y.o. male with hx metastatic pancreatic cancer admitted on 07/10/2016 with severe abdominal pain.  CT showed free air and worsening metastatic disease with small bowel perforation.  Pt underwent s/p exploratory lap and segmental small bowel resection 12/14.  Pharmacy has been consulted for zosyn dosing.    Today, 07/13/2016 Day #3 of 5 abx   - renal: SCr improved - WBC improving  Plan: Zosyn 3.375g IV q8h (4 hour infusion). - no changes to dose.  Pharmacy to sign-off as do not anticipate dose adjustment based on current renal function and length of therapy.    Height: 5\' 10"  (177.8 cm) Weight: 215 lb (97.5 kg) IBW/kg (Calculated) : 73  Temp (24hrs), Avg:98.1 F (36.7 C), Min:97.8 F (36.6 C), Max:98.7 F (37.1 C)   Recent Labs Lab 07/10/16 2206 07/10/16 2221 07/11/16 0200 07/11/16 0834 07/12/16 0429 07/13/16 0649  WBC 26.8*  --   --  46.4* 38.9* 32.6*  CREATININE 1.28*  --   --  1.29* 1.23 1.06  LATICACIDVEN  --  2.71* 2.04*  --   --   --     Estimated Creatinine Clearance: 69.4 mL/min (by C-G formula based on SCr of 1.06 mg/dL).    No Known Allergies  Antimicrobials this admission: 12/14 Zosyn >>   Dose adjustments this admission:  Microbiology results:  Thank you for allowing pharmacy to be a part of this patient's care.  Doreene Eland, PharmD, BCPS.   Pager: [phone] 07/13/2016 10:35 AM

## 2016-07-14 LAB — GLUCOSE, CAPILLARY
GLUCOSE-CAPILLARY: 120 mg/dL — AB (ref 65–99)
GLUCOSE-CAPILLARY: 127 mg/dL — AB (ref 65–99)
Glucose-Capillary: 118 mg/dL — ABNORMAL HIGH (ref 65–99)
Glucose-Capillary: 123 mg/dL — ABNORMAL HIGH (ref 65–99)
Glucose-Capillary: 114 mg/dL — ABNORMAL HIGH (ref 65–99)
Glucose-Capillary: 138 mg/dL — ABNORMAL HIGH (ref 65–99)

## 2016-07-14 MED ORDER — MORPHINE SULFATE (PF) 2 MG/ML IV SOLN
2.0000 mg | INTRAVENOUS | Status: DC | PRN
  Administered 2016-07-15 – 2016-07-16 (×5): 2 mg via INTRAVENOUS
  Administered 2016-07-16 – 2016-07-17 (×3): 4 mg via INTRAVENOUS
  Administered 2016-07-18 – 2016-07-20 (×7): 2 mg via INTRAVENOUS
  Administered 2016-07-20: 4 mg via INTRAVENOUS
  Administered 2016-07-21 – 2016-07-25 (×5): 2 mg via INTRAVENOUS
  Filled 2016-07-14: qty 1
  Filled 2016-07-14 (×3): qty 2
  Filled 2016-07-14 (×3): qty 1
  Filled 2016-07-14: qty 2
  Filled 2016-07-14: qty 1
  Filled 2016-07-14: qty 2
  Filled 2016-07-14 (×7): qty 1
  Filled 2016-07-14: qty 2
  Filled 2016-07-14 (×4): qty 1

## 2016-07-14 NOTE — Progress Notes (Signed)
[facility]  [address] [place], [place] [address] Phone: [phone] FAX: [phone]   SLAYTON LUBITZ [id number] 1941/01/17    Problem List:   Principal Problem:   Small bowel perforation s/p SB resection 07/11/2016 Active Problems:   Stage IV adenocarcinoma of pancreas (HCC)   COPD (chronic obstructive pulmonary disease) ([place])   SBO (small bowel obstruction)   Diabetes mellitus ([place])   Hypothyroid   3 Days Post-Op  07/11/2016  5:12 AM  PATIENT:  Albert Richards  75 y.o. male  PRE-OPERATIVE DIAGNOSIS:  Small Bowel Perferation  POST-OPERATIVE DIAGNOSIS:  SMALL BOWEL PERFORATION  PROCEDURE:  Procedure(s): EXPLORATORY LAPAROTOMY (N/A) with segmental small bowel resection  SURGEON:  Surgeon(s) and Role:    * Autumn Messing III, MD - Primary  Assessment  Recovering  Plan:  -IV ABx x 5 d per peritonitis protocol -wean IVF w PRN backup -f/u pathology -pain control.  RTC toradol & robaxin x 2 d.  PCA -NGT until flatus, then clamping trial.  Low vol clears OK.  STRICT I&O -DM control -  Cont SSI -hypothyroidism - give levothyroxine IV with Ileus -VTE prophylaxis- SCDs, etc -mobilize as tolerated to help recovery - get him up! PT/OT evals   I updated the patient's status to the patient and spouse.  Recommendations were made.  Questions were answered.  They expressed understanding & appreciation.   Adin Hector, M.D., F.A.C.S. Gastrointestinal and Minimally Invasive Surgery [facility] [address] [place], [place] [address] [phone] Main / Paging   07/14/2016  CARE TEAM:  PCP: [name], MD  Outpatient Care Team: Patient Care Team: [name], MD as PCP - General (Internal Medicine)  Inpatient Treatment Team: Treatment Team: Attending Provider: Nolon Nations, MD; Attending Physician: Nolon Nations, MD; Registered Nurse: Tylene Fantasia, RN; Technician: Tenna Child, NT; Registered Nurse: Celedonio Savage, RN; Technician: Macy Junita Push, NT  Subjective:  Less sore - using PCA less C/o dry mouth - drinking # liquids  Up in chair Walked in hallways Wife in room Wants NGT out   Objective:  Vital signs:  Vitals:   07/13/16 2208 07/14/16 0000 07/14/16 0400 07/14/16 0614  BP: 133/63   127/68  Pulse: 82   75  Resp: '20 18 14 19  ' Temp: 98 F (36.7 C)   98 F (36.7 C)  TempSrc: Oral   Oral  SpO2: 94% 96% 91% 94%  Weight:      Height:        Last BM Date: 07/09/16  Intake/Output   Yesterday:  12/16 0701 - 12/17 0700 In: 1280 [P.O.:30; I.V.:927.5; IV Piggyback:322.5] Out: 2200 [Urine:700; Emesis/NG output:1500] This shift:  No intake/output data recorded.  Bowel function:  Flatus: No  BM:  No  Drain: Serous - thin   Physical Exam:  General: Pt awake/alert/oriented x4 in No acute distress Eyes: PERRL, normal EOM.  Sclera clear.  No icterus Neuro: CN II-XII intact w/o focal sensory/motor deficits. Lymph: No head/neck/groin lymphadenopathy Psych:  No delerium/psychosis/paranoia HENT: Normocephalic, Mucus membranes moist.  No thrush Neck: Supple, No tracheal deviation Chest: No chest wall pain w good excursion CV:  Pulses intact.  Regular rhythm MS: Normal AROM mjr joints.  No obvious deformity Abdomen: Somewhat firm.  Mildy distended.  Mildly tender at incisions only.  Wound without cellulitis and clean.  No evidence of peritonitis.  No incarcerated hernias. Ext:  SCDs BLE.  No mjr edema.  No cyanosis Skin: No petechiae / purpura  Results:   Labs: Results for orders placed or performed during the hospital encounter of 07/10/16 (from the past 48 hour(s))  Glucose, capillary     Status: Abnormal   Collection Time: 07/12/16  8:05 AM  Result Value Ref Range   Glucose-Capillary 164 (H) 65 - 99 mg/dL   Comment 1 Notify RN    Comment 2 Document in Chart   Glucose, capillary     Status: Abnormal   Collection  Time: 07/12/16  1:21 PM  Result Value Ref Range   Glucose-Capillary 169 (H) 65 - 99 mg/dL   Comment 1 Notify RN    Comment 2 Document in Chart   Glucose, capillary     Status: Abnormal   Collection Time: 07/12/16  6:18 PM  Result Value Ref Range   Glucose-Capillary 158 (H) 65 - 99 mg/dL  Glucose, capillary     Status: Abnormal   Collection Time: 07/12/16  7:39 PM  Result Value Ref Range   Glucose-Capillary 138 (H) 65 - 99 mg/dL  Glucose, capillary     Status: Abnormal   Collection Time: 07/13/16 12:10 AM  Result Value Ref Range   Glucose-Capillary 132 (H) 65 - 99 mg/dL  Glucose, capillary     Status: Abnormal   Collection Time: 07/13/16  4:09 AM  Result Value Ref Range   Glucose-Capillary 143 (H) 65 - 99 mg/dL  CBC     Status: Abnormal   Collection Time: 07/13/16  6:49 AM  Result Value Ref Range   WBC 32.6 (H) 4.0 - 10.5 K/uL   RBC 3.09 (L) 4.22 - 5.81 MIL/uL   Hemoglobin 10.3 (L) 13.0 - 17.0 g/dL   HCT 30.1 (L) 39.0 - 52.0 %   MCV 97.4 78.0 - 100.0 fL   MCH 33.3 26.0 - 34.0 pg   MCHC 34.2 30.0 - 36.0 g/dL   RDW 17.2 (H) 11.5 - 15.5 %   Platelets 165 150 - 400 K/uL  Basic metabolic panel     Status: Abnormal   Collection Time: 07/13/16  6:49 AM  Result Value Ref Range   Sodium 140 135 - 145 mmol/L   Potassium 4.3 3.5 - 5.1 mmol/L   Chloride 108 101 - 111 mmol/L   CO2 25 22 - 32 mmol/L   Glucose, Bld 171 (H) 65 - 99 mg/dL   BUN 17 6 - 20 mg/dL   Creatinine, Ser 1.06 0.61 - 1.24 mg/dL   Calcium 7.9 (L) 8.9 - 10.3 mg/dL   GFR calc non Af Amer >60 >60 mL/min   GFR calc Af Amer >60 >60 mL/min    Comment: (NOTE) The eGFR has been calculated using the CKD EPI equation. This calculation has not been validated in all clinical situations. eGFR's persistently <60 mL/min signify possible Chronic Kidney Disease.    Anion gap 7 5 - 15  Glucose, capillary     Status: Abnormal   Collection Time: 07/13/16  7:37 AM  Result Value Ref Range   Glucose-Capillary 147 (H) 65 - 99  mg/dL  Glucose, capillary     Status: Abnormal   Collection Time: 07/13/16 12:34 PM  Result Value Ref Range   Glucose-Capillary 144 (H) 65 - 99 mg/dL  Glucose, capillary     Status: Abnormal   Collection Time: 07/13/16  5:14 PM  Result Value Ref Range   Glucose-Capillary 107 (H) 65 - 99 mg/dL  Glucose, capillary     Status: Abnormal  Collection Time: 07/14/16 12:35 AM  Result Value Ref Range   Glucose-Capillary 120 (H) 65 - 99 mg/dL  Glucose, capillary     Status: Abnormal   Collection Time: 07/14/16  3:59 AM  Result Value Ref Range   Glucose-Capillary 127 (H) 65 - 99 mg/dL    Imaging / Studies: No results found.  Medications / Allergies: per chart  Antibiotics: Anti-infectives    Start     Dose/Rate Route Frequency Ordered Stop   07/11/16 1000  piperacillin-tazobactam (ZOSYN) IVPB 3.375 g     3.375 g 12.5 mL/hr over 240 Minutes Intravenous Every 8 hours 07/11/16 0705 07/16/16 0959   07/11/16 0030  piperacillin-tazobactam (ZOSYN) IVPB 3.375 g     3.375 g 12.5 mL/hr over 240 Minutes Intravenous  Once 07/11/16 0028 07/11/16 0244        Note: Portions of this report may have been transcribed using voice recognition software. Every effort was made to ensure accuracy; however, inadvertent computerized transcription errors may be present.   Any transcriptional errors that result from this process are unintentional.     Adin Hector, M.D., F.A.C.S. Gastrointestinal and Minimally Invasive Surgery [facility] [address] [place], [place] [address] [phone] Main / Paging   07/14/2016

## 2016-07-15 LAB — BASIC METABOLIC PANEL
ANION GAP: 5 (ref 5–15)
BUN: 19 mg/dL (ref 6–20)
CALCIUM: 7.7 mg/dL — AB (ref 8.9–10.3)
CO2: 27 mmol/L (ref 22–32)
CREATININE: 0.95 mg/dL (ref 0.61–1.24)
Chloride: 106 mmol/L (ref 101–111)
GFR calc Af Amer: >60 mL/min (ref >60)
GLUCOSE: 151 mg/dL — AB (ref 65–99)
Potassium: 3.3 mmol/L — ABNORMAL LOW (ref 3.5–5.1)
Sodium: 138 mmol/L (ref 135–145)

## 2016-07-15 LAB — GLUCOSE, CAPILLARY
GLUCOSE-CAPILLARY: 104 mg/dL — AB (ref 65–99)
GLUCOSE-CAPILLARY: 131 mg/dL — AB (ref 65–99)
GLUCOSE-CAPILLARY: 137 mg/dL — AB (ref 65–99)
Glucose-Capillary: 108 mg/dL — ABNORMAL HIGH (ref 65–99)
Glucose-Capillary: 155 mg/dL — ABNORMAL HIGH (ref 65–99)
Glucose-Capillary: 102 mg/dL — ABNORMAL HIGH (ref 65–99)
Glucose-Capillary: 188 mg/dL — ABNORMAL HIGH (ref 65–99)

## 2016-07-15 LAB — CBC
HCT: 25.6 % — ABNORMAL LOW (ref 39.0–52.0)
HEMOGLOBIN: 8.9 g/dL — AB (ref 13.0–17.0)
MCH: 33.1 pg (ref 26.0–34.0)
MCHC: 34.8 g/dL (ref 30.0–36.0)
MCV: 95.2 fL (ref 78.0–100.0)
PLATELETS: 275 10*3/uL (ref 150–400)
RBC: 2.69 MIL/uL — ABNORMAL LOW (ref 4.22–5.81)
RDW: 16.5 % — AB (ref 11.5–15.5)
WBC: 14.8 10*3/uL — ABNORMAL HIGH (ref 4.0–10.5)

## 2016-07-15 LAB — MAGNESIUM: MAGNESIUM: 1.8 mg/dL (ref 1.7–2.4)

## 2016-07-15 MED ORDER — PANTOPRAZOLE SODIUM 40 MG PO TBEC
40.0000 mg | DELAYED_RELEASE_TABLET | Freq: Every day | ORAL | Status: DC
  Administered 2016-07-15 – 2016-07-16 (×2): 40 mg via ORAL
  Filled 2016-07-15 (×2): qty 1

## 2016-07-15 MED ORDER — LEVOTHYROXINE SODIUM 100 MCG PO TABS
175.0000 ug | ORAL_TABLET | Freq: Every day | ORAL | Status: DC
  Administered 2016-07-15 – 2016-07-17 (×3): 175 ug via ORAL
  Filled 2016-07-15 (×2): qty 1

## 2016-07-15 MED ORDER — POTASSIUM CHLORIDE CRYS ER 20 MEQ PO TBCR
20.0000 meq | EXTENDED_RELEASE_TABLET | Freq: Two times a day (BID) | ORAL | Status: AC
  Administered 2016-07-15 (×2): 20 meq via ORAL
  Filled 2016-07-15 (×2): qty 1

## 2016-07-15 NOTE — Progress Notes (Signed)
Patient ID: Albert Richards, male   DOB: [birth year], 75 y.o.   MRN: [id number]  [facility] Surgery Progress Note  4 Days Post-Op  Subjective: NG clamping trial yesterday evening. Tolerating clear liquids, BM yesterday, continues to pass flatus today. Abdominal pain well controlled. Reports some heartburn after PO intake yesterday, tolerating breakfast well today thus far. Denies n/v.  Objective: Vital signs in last 24 hours: Temp:  [98.2 F (36.8 C)-99.4 F (37.4 C)] 98.2 F (36.8 C) (12/18 0444) Pulse Rate:  [71-87] 71 (12/18 0444) Resp:  [16-18] 18 (12/18 0444) BP: (130-150)/(62-72) 134/64 (12/18 0444) SpO2:  [88 %-97 %] 97 % (12/18 0444) Last BM Date: 07/15/16  Intake/Output from previous day: 12/17 0701 - 12/18 0700 In: 1610 [P.O.:360; I.V.:1200; IV Piggyback:50] Out: 0  Intake/Output this shift: No intake/output data recorded.  PE: Gen:  Alert, NAD, pleasant Card:  RRR, no M/G/R heard Pulm:  on O2, CTAB, no W/R/R, effort normal Abd: Soft, mild distension, NT, +BS, open midline incision healing well with good granulation tissue and no drainage or surrounding erythema  Lab Results:   Recent Labs  07/13/16 0649 07/15/16 0347  WBC 32.6* 14.8*  HGB 10.3* 8.9*  HCT 30.1* 25.6*  PLT 165 275   BMET  Recent Labs  07/13/16 0649 07/15/16 0347  NA 140 138  K 4.3 3.3*  CL 108 106  CO2 25 27  GLUCOSE 171* 151*  BUN 17 19  CREATININE 1.06 0.95  CALCIUM 7.9* 7.7*   PT/INR No results for input(s): LABPROT, INR in the last 72 hours. CMP     Component Value Date/Time   NA 138 07/15/2016 0347   NA 140 04/08/2016 1112   K 3.3 (L) 07/15/2016 0347   K 4.3 04/08/2016 1112   CL 106 07/15/2016 0347   CO2 27 07/15/2016 0347   CO2 26 04/08/2016 1112   GLUCOSE 151 (H) 07/15/2016 0347   GLUCOSE 137 04/08/2016 1112   BUN 19 07/15/2016 0347   BUN 16.8 04/08/2016 1112   CREATININE 0.95 07/15/2016 0347   CREATININE 1.1 04/08/2016 1112   CALCIUM 7.7 (L)  07/15/2016 0347   CALCIUM 9.3 04/08/2016 1112   PROT 7.3 07/10/2016 2206   PROT 6.7 07/20/2013 1432   ALBUMIN 3.9 07/10/2016 2206   ALBUMIN 3.4 (L) 07/20/2013 1432   AST 20 07/10/2016 2206   AST 15 07/20/2013 1432   ALT 22 07/10/2016 2206   ALT 15 07/20/2013 1432   ALKPHOS 228 (H) 07/10/2016 2206   ALKPHOS 81 07/20/2013 1432   BILITOT 0.5 07/10/2016 2206   BILITOT 0.40 07/20/2013 1432   GFRNONAA >60 07/15/2016 0347   GFRAA >60 07/15/2016 0347   Lipase     Component Value Date/Time   LIPASE 19 07/10/2016 2206       Studies/Results: No results found.  Anti-infectives: Anti-infectives    Start     Dose/Rate Route Frequency Ordered Stop   07/11/16 1000  piperacillin-tazobactam (ZOSYN) IVPB 3.375 g     3.375 g 12.5 mL/hr over 240 Minutes Intravenous Every 8 hours 07/11/16 0705 07/16/16 0959   07/11/16 0030  piperacillin-tazobactam (ZOSYN) IVPB 3.375 g     3.375 g 12.5 mL/hr over 240 Minutes Intravenous  Once 07/11/16 0028 07/11/16 0244       Assessment/Plan Small bowel perforation in the setting of metastatic pancreatic adenocarcinoma S/P EXPLORATORY LAPAROTOMY, SEGMENTAL SMALL BOWEL RESECTION, 07/11/16 Dr. Autumn Messing III             POD#4  Surgical pathology pending             WBC 14.8 from 32.6, TMAX 99.4 over night  NG clamping trial started yesterday, tolerated well  BM yesterday and passing flatus.  Pancreatic cancer- last dose chemotherapy 07/02/16 Diabetes Mellitus - Moderate SSI Hypothyroidism - levothyroxine 175 mcg COPD GERD - protonix 40mg   FEN: full liquids ID: Zosyn 12/14>>12/19 VTE: heparin/ SCD's  Plan: Bowel function returning. D/c NG tube and advance to full liquids. Switch protonix and levothyroxine to PO. Continue PT/mobilization. Continue IV antibiotics through tomorrow. Continue BID wet to dry dressing changes. Replace potassium. CBC/BMET in AM Home health ordered for PT/RN for dressing changes.   LOS: 4 days    Jerrye Beavers , [facility] 07/15/2016, 9:27 AM Pager: [phone] Consults: [phone] Mon-Fri 7:00 am-4:30 pm Sat-Sun 7:00 am-11:30 am

## 2016-07-15 NOTE — Progress Notes (Signed)
Physical Therapy Treatment Patient Details Name: CHARON BRANDES MRN: [id number] DOB: [birth year] Today's Date: 07/15/2016    History of Present Illness The patient is a 75 year old white male who has a history of metastatic pancreatic cancer for the last 3 years, adm with severe abd pain;  s/p EXPLORATORY LAPAROTOMY  with segmental small bowel resection on 07/11/16;    PT Comments    EXP LAP POD #4 Assisted out of recliner to amb a greater distance in hallway using RW.  Slow but steady gait.  Pre medicated for pain.  Assisted back to bed per pt request.  Spouse present and very helpful.   Follow Up Recommendations  Home health PT     Equipment Recommendations  Rolling walker with 5" wheels    Recommendations for Other Services       Precautions / Restrictions Precautions Precautions: Fall Precaution Comments: abd incision/wound, Restrictions Weight Bearing Restrictions: No    Mobility  Bed Mobility Overal bed mobility: Needs Assistance Bed Mobility: Rolling;Sidelying to Sit;Sit to Sidelying Rolling: Min assist Sidelying to sit: Min assist     Sit to sidelying: Mod assist;Min assist General bed mobility comments: increased time and assist with B LE up onto bed  Transfers Overall transfer level: Needs assistance Equipment used: Rolling walker (2 wheeled) Transfers: Sit to/from Stand Sit to Stand: Min assist         General transfer comment: cues for hand placement and use of LEs to power up/contorl descent  Ambulation/Gait Ambulation/Gait assistance: Min assist Ambulation Distance (Feet): 75 Feet Assistive device: Rolling walker (2 wheeled) Gait Pattern/deviations: Step-to pattern;Step-through pattern;Trunk flexed;Decreased step length - right;Decreased step length - left Gait velocity: decreased   General Gait Details: increased time, slow but steady gait   Stairs            Wheelchair Mobility    Modified Rankin (Stroke Patients Only)        Balance                                    Cognition Arousal/Alertness: Awake/alert Behavior During Therapy: WFL for tasks assessed/performed Overall Cognitive Status: Within Functional Limits for tasks assessed                      Exercises      General Comments        Pertinent Vitals/Pain Pain Assessment: Faces Pain Score: 8  Faces Pain Scale: Hurts little more Pain Location: ABD recent surgery Pain Descriptors / Indicators: Discomfort;Grimacing;Operative site guarding Pain Intervention(s): Monitored during session;Repositioned    Home Living Family/patient expects to be discharged to:: Private residence Living Arrangements: Spouse/significant other Available Help at Discharge: Family Type of Home: House Home Access: Stairs to enter Entrance Stairs-Rails: Right Home Layout: One level Home Equipment: None      Prior Function Level of Independence: Independent          PT Goals (current goals can now be found in the care plan section) Acute Rehab PT Goals Patient Stated Goal: home Progress towards PT goals: Progressing toward goals    Frequency    Min 3X/week      PT Plan Current plan remains appropriate    Co-evaluation             End of Session Equipment Utilized During Treatment: Gait belt Activity Tolerance: Patient tolerated treatment well Patient left: in bed;with call bell/phone within  reach;with bed alarm set;with family/visitor present     Time: 1555-1620 PT Time Calculation (min) (ACUTE ONLY): 25 min  Charges:  $Gait Training: 8-22 mins $Therapeutic Activity: 8-22 mins                    G Codes:      Rica Koyanagi  PTA [facility]  Acute  Rehab Pager      [phone]

## 2016-07-15 NOTE — Progress Notes (Signed)
Spoke with patient and wife at bedside. Discussed HH options for PT and nursing. Wife will be primary caregiver, she is uncomfortable with wound care, also concerned about weakness and mobility. Discussed limitations of HH, they are familiar with SNF and do not want that as an option. Wife plans to contact a friend to get a referral for [facility], will f/u with her tomorrow regarding decision. Will arrange [place] and DME at that time.

## 2016-07-15 NOTE — Evaluation (Signed)
Occupational Therapy Evaluation Patient Details Name: Albert Richards MRN: [id number] DOB: February 01, 1941 Today's Date: 07/15/2016    History of Present Illness The patient is a 75 year old white male who has a history of metastatic pancreatic cancer for the last 3 years, adm with severe abd pain;  s/p EXPLORATORY LAPAROTOMY  with segmental small bowel resection on 07/11/16;   Clinical Impression   Pt admitted with ab pain. Pt currently with functional limitations due to the deficits listed below (see OT Problem List).  Pt will benefit from skilled OT to increase their safety and independence with ADL and functional mobility for ADL to facilitate discharge to venue listed below.      Follow Up Recommendations  Home health OT    Equipment Recommendations  None recommended by OT       Precautions / Restrictions Precautions Precaution Comments: abd incision/wound, NGT      Mobility Bed Mobility Overal bed mobility: Needs Assistance Bed Mobility: Rolling;Sidelying to Sit;Sit to Sidelying Rolling: Min assist Sidelying to sit: Min assist     Sit to sidelying: Min assist;Mod assist General bed mobility comments: incr time, cues to log roll; assist to bring LEs on/of bed and light assist to bring trunk to upright  Transfers Overall transfer level: Needs assistance Equipment used: Rolling walker (2 wheeled) Transfers: Sit to/from Stand Sit to Stand: Min assist         General transfer comment: cues for hand placement and use of LEs to power up/contorl descent         ADL Overall ADL's : Needs assistance/impaired Eating/Feeding: Set up;Sitting   Grooming: Set up;Sitting   Upper Body Bathing: Minimal assistance;Sitting   Lower Body Bathing: Maximal assistance;Sit to/from stand;Cueing for safety;Cueing for sequencing   Upper Body Dressing : Minimal assistance;Sitting   Lower Body Dressing: Maximal assistance;Sit to/from stand;Cueing for safety;Cueing for  sequencing   Toilet Transfer: Minimal assistance;RW;Stand-pivot;Cueing for sequencing;Cueing for safety Toilet Transfer Details (indicate cue type and reason): bed to chair Toileting- Clothing Manipulation and Hygiene: Sit to/from stand;Cueing for safety;Cueing for sequencing;Maximal assistance         General ADL Comments: pt stating he is not feeling as well as yesterday               Pertinent Vitals/Pain Pain Score: 8  Pain Location: stomach Pain Descriptors / Indicators: Sore Pain Intervention(s): Monitored during session        Extremity/Trunk Assessment Upper Extremity Assessment Upper Extremity Assessment: Overall WFL for tasks assessed           Communication Communication Communication: No difficulties   Cognition Arousal/Alertness: Awake/alert Behavior During Therapy: WFL for tasks assessed/performed Overall Cognitive Status: Within Functional Limits for tasks assessed                                Home Living Family/patient expects to be discharged to:: Private residence Living Arrangements: Spouse/significant other Available Help at Discharge: Family Type of Home: House Home Access: Stairs to enter Technical brewer of Steps: 6 Entrance Stairs-Rails: Right Home Layout: One level     Bathroom Shower/Tub: Chief Strategy Officer: None          Prior Functioning/Environment Level of Independence: Independent                 OT Problem List: Decreased strength;Pain;Decreased safety awareness;Decreased activity tolerance   OT Treatment/Interventions:  Self-care/ADL training;DME and/or AE instruction;Patient/family education    OT Goals(Current goals can be found in the care plan section) Acute Rehab OT Goals Patient Stated Goal: home OT Goal Formulation: With patient Time For Goal Achievement: 07/29/16 Potential to Achieve Goals: Good  OT Frequency: Min 2X/week              End of Session  Equipment Utilized During Treatment: Rolling walker Nurse Communication: Mobility status  Activity Tolerance: Patient tolerated treatment well Patient left: in chair;with call bell/phone within reach;with family/visitor present   Time: 1330-1405 OT Time Calculation (min): 35 min Charges:  OT General Charges $OT Visit: 1 Procedure OT Evaluation $OT Eval Moderate Complexity: 1 Procedure OT Treatments $Self Care/Home Management : 8-22 mins G-Codes:    Payton Mccallum D 08/04/2016, 2:09 PM

## 2016-07-15 NOTE — Care Management Important Message (Signed)
Important Message  Patient Details  Name: Albert Richards MRN: [id number] Date of Birth: February 17, 1941   Medicare Important Message Given:  Yes    Kerin Salen 07/15/2016, 2:01 [place] Message  Patient Details  Name: Albert Richards MRN: [id number] Date of Birth: 08/23/1940   Medicare Important Message Given:  Yes    Kerin Salen 07/15/2016, 2:01 PM

## 2016-07-16 LAB — BASIC METABOLIC PANEL
ANION GAP: 7 (ref 5–15)
BUN: 12 mg/dL (ref 6–20)
CALCIUM: 7.8 mg/dL — AB (ref 8.9–10.3)
CO2: 26 mmol/L (ref 22–32)
Chloride: 104 mmol/L (ref 101–111)
Creatinine, Ser: 0.81 mg/dL (ref 0.61–1.24)
GFR calc non Af Amer: >60 mL/min (ref >60)
Glucose, Bld: 122 mg/dL — ABNORMAL HIGH (ref 65–99)
Potassium: 3.4 mmol/L — ABNORMAL LOW (ref 3.5–5.1)
Sodium: 137 mmol/L (ref 135–145)

## 2016-07-16 LAB — GLUCOSE, CAPILLARY
GLUCOSE-CAPILLARY: 114 mg/dL — AB (ref 65–99)
GLUCOSE-CAPILLARY: 133 mg/dL — AB (ref 65–99)
GLUCOSE-CAPILLARY: 179 mg/dL — AB (ref 65–99)
Glucose-Capillary: 107 mg/dL — ABNORMAL HIGH (ref 65–99)
Glucose-Capillary: 139 mg/dL — ABNORMAL HIGH (ref 65–99)
Glucose-Capillary: 170 mg/dL — ABNORMAL HIGH (ref 65–99)

## 2016-07-16 LAB — CBC
HEMATOCRIT: 27 % — AB (ref 39.0–52.0)
HEMOGLOBIN: 9.4 g/dL — AB (ref 13.0–17.0)
MCH: 33.1 pg (ref 26.0–34.0)
MCHC: 34.8 g/dL (ref 30.0–36.0)
MCV: 95.1 fL (ref 78.0–100.0)
Platelets: 353 10*3/uL (ref 150–400)
RBC: 2.84 MIL/uL — ABNORMAL LOW (ref 4.22–5.81)
RDW: 16.5 % — AB (ref 11.5–15.5)
WBC: 15.9 10*3/uL — AB (ref 4.0–10.5)

## 2016-07-16 MED ORDER — POTASSIUM CHLORIDE CRYS ER 20 MEQ PO TBCR
20.0000 meq | EXTENDED_RELEASE_TABLET | Freq: Two times a day (BID) | ORAL | Status: AC
  Administered 2016-07-16 (×2): 20 meq via ORAL
  Filled 2016-07-16 (×2): qty 1

## 2016-07-16 NOTE — Progress Notes (Signed)
Patient ID: Albert Richards, male   DOB: 07-31-1940, 75 y.o.   MRN: [id number]  [facility] Progress Note  5 Days Post-Op  Subjective: Feels about the same as yesterday. Having some abdominal discomfort after PO intake. Passing flatus. Worked with PT yesterday.  Objective: Vital signs in last 24 hours: Temp:  [98.1 F (36.7 C)-99.3 F (37.4 C)] 98.1 F (36.7 C) (12/19 0407) Pulse Rate:  [73-77] 73 (12/19 0407) Resp:  [18] 18 (12/19 0407) BP: (116-134)/(62-67) 116/67 (12/19 0407) SpO2:  [92 %-94 %] 94 % (12/19 0407) Last BM Date: 07/15/16  Intake/Output from previous day: 12/18 0701 - 12/19 0700 In: 1995 [P.O.:240; I.V.:1210; IV Piggyback:545] Out: -  Intake/Output this shift: Total I/O In: 120 [P.O.:120] Out: -   PE: Gen:  Alert, NAD, pleasant Card:  RRR, no M/G/R heard Pulm:  on O2, CTAB, no W/R/R, effort normal Abd: Soft, mild distension, NT, +BS, open midline incision covered with clean/dry dressing (per RN wound is clean with no drainage)  Lab Results:   Recent Labs  07/15/16 0347 07/16/16 0534  WBC 14.8* 15.9*  HGB 8.9* 9.4*  HCT 25.6* 27.0*  PLT 275 353   BMET  Recent Labs  07/15/16 0347 07/16/16 0534  NA 138 137  K 3.3* 3.4*  CL 106 104  CO2 27 26  GLUCOSE 151* 122*  BUN 19 12  CREATININE 0.95 0.81  CALCIUM 7.7* 7.8*   PT/INR No results for input(s): LABPROT, INR in the last 72 hours. CMP     Component Value Date/Time   NA 137 07/16/2016 0534   NA 140 04/08/2016 1112   K 3.4 (L) 07/16/2016 0534   K 4.3 04/08/2016 1112   CL 104 07/16/2016 0534   CO2 26 07/16/2016 0534   CO2 26 04/08/2016 1112   GLUCOSE 122 (H) 07/16/2016 0534   GLUCOSE 137 04/08/2016 1112   BUN 12 07/16/2016 0534   BUN 16.8 04/08/2016 1112   CREATININE 0.81 07/16/2016 0534   CREATININE 1.1 04/08/2016 1112   CALCIUM 7.8 (L) 07/16/2016 0534   CALCIUM 9.3 04/08/2016 1112   PROT 7.3 07/10/2016 2206   PROT 6.7 07/20/2013 1432   ALBUMIN 3.9 07/10/2016  2206   ALBUMIN 3.4 (L) 07/20/2013 1432   AST 20 07/10/2016 2206   AST 15 07/20/2013 1432   ALT 22 07/10/2016 2206   ALT 15 07/20/2013 1432   ALKPHOS 228 (H) 07/10/2016 2206   ALKPHOS 81 07/20/2013 1432   BILITOT 0.5 07/10/2016 2206   BILITOT 0.40 07/20/2013 1432   GFRNONAA >60 07/16/2016 0534   GFRAA >60 07/16/2016 0534   Lipase     Component Value Date/Time   LIPASE 19 07/10/2016 2206       Studies/Results: No results found.  Anti-infectives: Anti-infectives    Start     Dose/Rate Route Frequency Ordered Stop   07/11/16 1000  piperacillin-tazobactam (ZOSYN) IVPB 3.375 g     3.375 g 12.5 mL/hr over 240 Minutes Intravenous Every 8 hours 07/11/16 0705 07/16/16 0528   07/11/16 0030  piperacillin-tazobactam (ZOSYN) IVPB 3.375 g     3.375 g 12.5 mL/hr over 240 Minutes Intravenous  Once 07/11/16 0028 07/11/16 0244       Assessment/Plan Small bowel perforation in the setting of metastatic pancreatic adenocarcinoma S/P EXPLORATORY LAPAROTOMY, SEGMENTAL SMALL BOWEL RESECTION, 07/11/16 Dr. Autumn Messing III POD#5 Surgical pathology pending WBC slightly up today 15.9 from 14.8, afebrile             passing  flatus and tolerating small amounts FLD  Pancreatic cancer- last dose chemotherapy 07/02/16 Diabetes Mellitus - Moderate SSI Hypothyroidism - levothyroxine 175 mcg COPD GERD - protonix 40mg   FEN: full liquids, IVF ID: Zosyn 12/14>>12/19 VTE: heparin/ SCD's  Plan: continue full liquids. Patient needs to ambulate more today. Continue PT/mobilization. Continue IV antibiotics today. Continue BID wet to dry dressing changes. Replace potassium. CBC/BMET in AM    LOS: 5 days    Jerrye Beavers , [facility] Surgery 07/16/2016, 9:56 AM Pager: [phone] Consults: [phone] Mon-Fri 7:00 am-4:30 pm Sat-Sun 7:00 am-11:30 am

## 2016-07-17 ENCOUNTER — Encounter (HOSPITAL_COMMUNITY): Admission: EM | Disposition: A | Discharge status: Home Health | Payer: Self-pay | Source: Home / Self Care

## 2016-07-17 ENCOUNTER — Inpatient Hospital Stay (HOSPITAL_COMMUNITY): Payer: Medicare Other | Admitting: Anesthesiology

## 2016-07-17 ENCOUNTER — Inpatient Hospital Stay (HOSPITAL_COMMUNITY): Payer: Medicare Other

## 2016-07-17 HISTORY — PX: LAPAROTOMY: SHX154

## 2016-07-17 LAB — GLUCOSE, CAPILLARY
GLUCOSE-CAPILLARY: 185 mg/dL — AB (ref 65–99)
Glucose-Capillary: 161 mg/dL — ABNORMAL HIGH (ref 65–99)
Glucose-Capillary: 136 mg/dL — ABNORMAL HIGH (ref 65–99)
Glucose-Capillary: 159 mg/dL — ABNORMAL HIGH (ref 65–99)
Glucose-Capillary: 129 mg/dL — ABNORMAL HIGH (ref 65–99)

## 2016-07-17 LAB — URINALYSIS, ROUTINE W REFLEX MICROSCOPIC
Bilirubin Urine: NEGATIVE
GLUCOSE, UA: NEGATIVE mg/dL
HGB URINE DIPSTICK: NEGATIVE
KETONES UR: NEGATIVE mg/dL
Leukocytes, UA: NEGATIVE
Nitrite: NEGATIVE
PH: 5 (ref 5.0–8.0)
Protein, ur: NEGATIVE mg/dL
Specific Gravity, Urine: 1.026 (ref 1.005–1.030)

## 2016-07-17 LAB — BASIC METABOLIC PANEL
ANION GAP: 5 (ref 5–15)
BUN: 12 mg/dL (ref 6–20)
CHLORIDE: 103 mmol/L (ref 101–111)
CO2: 27 mmol/L (ref 22–32)
Calcium: 7.8 mg/dL — ABNORMAL LOW (ref 8.9–10.3)
Creatinine, Ser: 0.82 mg/dL (ref 0.61–1.24)
GFR calc Af Amer: >60 mL/min (ref >60)
GLUCOSE: 162 mg/dL — AB (ref 65–99)
POTASSIUM: 3.4 mmol/L — AB (ref 3.5–5.1)
SODIUM: 135 mmol/L (ref 135–145)

## 2016-07-17 LAB — CBC
HCT: 29.7 % — ABNORMAL LOW (ref 39.0–52.0)
HEMOGLOBIN: 10.2 g/dL — AB (ref 13.0–17.0)
MCH: 32.8 pg (ref 26.0–34.0)
MCHC: 34.3 g/dL (ref 30.0–36.0)
MCV: 95.5 fL (ref 78.0–100.0)
Platelets: 472 10*3/uL — ABNORMAL HIGH (ref 150–400)
RBC: 3.11 MIL/uL — AB (ref 4.22–5.81)
RDW: 16.9 % — ABNORMAL HIGH (ref 11.5–15.5)
WBC: 16.9 10*3/uL — AB (ref 4.0–10.5)

## 2016-07-17 SURGERY — LAPAROTOMY, EXPLORATORY
Anesthesia: General

## 2016-07-17 MED ORDER — SUGAMMADEX SODIUM 200 MG/2ML IV SOLN
INTRAVENOUS | Status: DC | PRN
  Administered 2016-07-17: 200 mg via INTRAVENOUS

## 2016-07-17 MED ORDER — ONDANSETRON HCL 4 MG/2ML IJ SOLN
INTRAMUSCULAR | Status: AC
  Filled 2016-07-17: qty 2

## 2016-07-17 MED ORDER — ROCURONIUM BROMIDE 50 MG/5ML IV SOSY
PREFILLED_SYRINGE | INTRAVENOUS | Status: AC
  Filled 2016-07-17: qty 5

## 2016-07-17 MED ORDER — SUCCINYLCHOLINE CHLORIDE 200 MG/10ML IV SOSY
PREFILLED_SYRINGE | INTRAVENOUS | Status: AC
  Filled 2016-07-17: qty 10

## 2016-07-17 MED ORDER — LEVOTHYROXINE SODIUM 75 MCG PO TABS: 150.0000 ug | ORAL_TABLET | ORAL | Status: DC

## 2016-07-17 MED ORDER — MEPERIDINE HCL 50 MG/ML IJ SOLN: 6.2500 mg | INTRAMUSCULAR | Status: DC | PRN

## 2016-07-17 MED ORDER — LEVOTHYROXINE SODIUM 75 MCG PO TABS: 175.0000 ug | ORAL_TABLET | ORAL | Status: DC

## 2016-07-17 MED ORDER — ROCURONIUM BROMIDE 50 MG/5ML IV SOSY
PREFILLED_SYRINGE | INTRAVENOUS | Status: DC | PRN
  Administered 2016-07-17: 20 mg via INTRAVENOUS
  Administered 2016-07-17: 10 mg via INTRAVENOUS

## 2016-07-17 MED ORDER — PROMETHAZINE HCL 25 MG/ML IJ SOLN: 6.2500 mg | INTRAMUSCULAR | Status: DC | PRN

## 2016-07-17 MED ORDER — LIDOCAINE 2% (20 MG/ML) 5 ML SYRINGE
INTRAMUSCULAR | Status: AC
  Filled 2016-07-17: qty 5

## 2016-07-17 MED ORDER — ONDANSETRON HCL 4 MG/2ML IJ SOLN
INTRAMUSCULAR | Status: DC | PRN
  Administered 2016-07-17: 4 mg via INTRAVENOUS

## 2016-07-17 MED ORDER — ALBUMIN HUMAN 5 % IV SOLN
INTRAVENOUS | Status: AC
  Filled 2016-07-17: qty 250

## 2016-07-17 MED ORDER — FUROSEMIDE 10 MG/ML IJ SOLN
20.0000 mg | Freq: Once | INTRAMUSCULAR | Status: AC
  Administered 2016-07-17: 20 mg via INTRAVENOUS
  Filled 2016-07-17: qty 2

## 2016-07-17 MED ORDER — IOPAMIDOL (ISOVUE-300) INJECTION 61%: 30.0000 mL | Freq: Once | INTRAVENOUS | Status: DC | PRN

## 2016-07-17 MED ORDER — SUGAMMADEX SODIUM 200 MG/2ML IV SOLN
INTRAVENOUS | Status: AC
  Filled 2016-07-17: qty 2

## 2016-07-17 MED ORDER — HYDROMORPHONE HCL 1 MG/ML IJ SOLN
INTRAMUSCULAR | Status: AC
  Filled 2016-07-17: qty 1

## 2016-07-17 MED ORDER — PIPERACILLIN-TAZOBACTAM 3.375 G IVPB
INTRAVENOUS | Status: AC
  Filled 2016-07-17: qty 50

## 2016-07-17 MED ORDER — LACTATED RINGERS IV SOLN
INTRAVENOUS | Status: DC | PRN
  Administered 2016-07-17 (×2): via INTRAVENOUS

## 2016-07-17 MED ORDER — FENTANYL CITRATE (PF) 100 MCG/2ML IJ SOLN
INTRAMUSCULAR | Status: DC | PRN
  Administered 2016-07-17 (×2): 50 ug via INTRAVENOUS

## 2016-07-17 MED ORDER — ETOMIDATE 2 MG/ML IV SOLN
INTRAVENOUS | Status: AC
  Filled 2016-07-17: qty 10

## 2016-07-17 MED ORDER — SUCCINYLCHOLINE CHLORIDE 200 MG/10ML IV SOSY
PREFILLED_SYRINGE | INTRAVENOUS | Status: DC | PRN
  Administered 2016-07-17: 120 mg via INTRAVENOUS

## 2016-07-17 MED ORDER — IOPAMIDOL (ISOVUE-300) INJECTION 61%
INTRAVENOUS | Status: AC
  Filled 2016-07-17: qty 100

## 2016-07-17 MED ORDER — FENTANYL CITRATE (PF) 100 MCG/2ML IJ SOLN
INTRAMUSCULAR | Status: AC
  Filled 2016-07-17: qty 2

## 2016-07-17 MED ORDER — POTASSIUM CHLORIDE CRYS ER 20 MEQ PO TBCR
20.0000 meq | EXTENDED_RELEASE_TABLET | Freq: Three times a day (TID) | ORAL | Status: DC
  Administered 2016-07-17 (×2): 20 meq via ORAL
  Filled 2016-07-17 (×2): qty 1

## 2016-07-17 MED ORDER — PROPOFOL 10 MG/ML IV BOLUS
INTRAVENOUS | Status: AC
  Filled 2016-07-17: qty 20

## 2016-07-17 MED ORDER — ALBUMIN HUMAN 5 % IV SOLN
INTRAVENOUS | Status: DC | PRN
  Administered 2016-07-17: 23:00:00 via INTRAVENOUS

## 2016-07-17 MED ORDER — HYDROMORPHONE HCL 1 MG/ML IJ SOLN
0.2500 mg | INTRAMUSCULAR | Status: DC | PRN
  Administered 2016-07-17 – 2016-07-18 (×4): 0.5 mg via INTRAVENOUS

## 2016-07-17 MED ORDER — IOPAMIDOL (ISOVUE-300) INJECTION 61%
100.0000 mL | Freq: Once | INTRAVENOUS | Status: AC | PRN
  Administered 2016-07-17: 100 mL via INTRAVENOUS

## 2016-07-17 MED ORDER — ETOMIDATE 2 MG/ML IV SOLN
INTRAVENOUS | Status: DC | PRN
  Administered 2016-07-17: 16 mg via INTRAVENOUS

## 2016-07-17 MED ORDER — IOPAMIDOL (ISOVUE-300) INJECTION 61%
INTRAVENOUS | Status: AC
  Filled 2016-07-17: qty 30

## 2016-07-17 MED ORDER — PIPERACILLIN-TAZOBACTAM 3.375 G IVPB
3.3750 g | Freq: Three times a day (TID) | INTRAVENOUS | Status: DC
  Administered 2016-07-17: 3.375 g via INTRAVENOUS
  Filled 2016-07-17 (×3): qty 50

## 2016-07-17 MED ORDER — LIDOCAINE 2% (20 MG/ML) 5 ML SYRINGE
INTRAMUSCULAR | Status: DC | PRN
  Administered 2016-07-17: 80 mg via INTRAVENOUS

## 2016-07-17 MED ORDER — 0.9 % SODIUM CHLORIDE (POUR BTL) OPTIME
TOPICAL | Status: DC | PRN
  Administered 2016-07-17: 9000 mL

## 2016-07-17 SURGICAL SUPPLY — 47 items
BNDG GAUZE ELAST 4 BULKY (GAUZE/BANDAGES/DRESSINGS) ×4 IMPLANT
CHLORAPREP W/TINT 26ML (MISCELLANEOUS) ×3 IMPLANT
COVER MAYO STAND STRL (DRAPES) ×1 IMPLANT
COVER SURGICAL LIGHT HANDLE (MISCELLANEOUS) ×3 IMPLANT
DRAIN CHANNEL 19F RND (DRAIN) ×4 IMPLANT
DRAPE LAPAROSCOPIC ABDOMINAL (DRAPES) ×3 IMPLANT
DRAPE UTILITY XL STRL (DRAPES) ×3 IMPLANT
DRAPE WARM FLUID 44X44 (DRAPE) ×3 IMPLANT
DRSG OPSITE POSTOP 4X10 (GAUZE/BANDAGES/DRESSINGS) IMPLANT
DRSG OPSITE POSTOP 4X6 (GAUZE/BANDAGES/DRESSINGS) IMPLANT
DRSG OPSITE POSTOP 4X8 (GAUZE/BANDAGES/DRESSINGS) IMPLANT
DRSG PAD ABDOMINAL 8X10 ST (GAUZE/BANDAGES/DRESSINGS) ×4 IMPLANT
ELECT BLADE 6.5 EXT (BLADE) IMPLANT
ELECT REM PT RETURN 9FT ADLT (ELECTROSURGICAL) ×3
ELECTRODE REM PT RTRN 9FT ADLT (ELECTROSURGICAL) ×1 IMPLANT
EVACUATOR SILICONE 100CC (DRAIN) ×4 IMPLANT
GLOVE BIOGEL PI IND STRL 7.0 (GLOVE) ×1 IMPLANT
GLOVE BIOGEL PI INDICATOR 7.0 (GLOVE) ×2
GLOVE SURG SS PI 7.0 STRL IVOR (GLOVE) ×3 IMPLANT
GOWN STRL REUS W/TWL LRG LVL3 (GOWN DISPOSABLE) ×3 IMPLANT
HANDLE SUCTION POOLE (INSTRUMENTS) ×1 IMPLANT
KIT BASIN OR (CUSTOM PROCEDURE TRAY) ×3 IMPLANT
PACK GENERAL/GYN (CUSTOM PROCEDURE TRAY) ×3 IMPLANT
RELOAD PROXIMATE 100 BLUE (MISCELLANEOUS) IMPLANT
RELOAD PROXIMATE 100MM BLUE (MISCELLANEOUS)
RELOAD STAPLE 100 3.8 BLU REG (MISCELLANEOUS) IMPLANT
SEALER TISSUE X1 CVD JAW (INSTRUMENTS) IMPLANT
SPONGE DRAIN TRACH 4X4 STRL 2S (GAUZE/BANDAGES/DRESSINGS) ×4 IMPLANT
SPONGE LAP 18X18 X RAY DECT (DISPOSABLE) IMPLANT
STAPLER PROXIMATE 100MM BLUE (MISCELLANEOUS) IMPLANT
STAPLER VISISTAT 35W (STAPLE) ×1 IMPLANT
SUCTION POOLE HANDLE (INSTRUMENTS) ×3
SUT ETHILON 2 0 PS N (SUTURE) ×4 IMPLANT
SUT MNCRL AB 4-0 PS2 18 (SUTURE) IMPLANT
SUT PDS AB 0 CT1 36 (SUTURE) IMPLANT
SUT PDS AB 1 TP1 96 (SUTURE) ×4 IMPLANT
SUT SILK 2 0 (SUTURE) ×3
SUT SILK 2 0 SH CR/8 (SUTURE) ×3 IMPLANT
SUT SILK 2-0 18XBRD TIE 12 (SUTURE) ×1 IMPLANT
SUT SILK 3 0 (SUTURE) ×3
SUT SILK 3 0 SH CR/8 (SUTURE) ×3 IMPLANT
SUT SILK 3-0 18XBRD TIE 12 (SUTURE) ×1 IMPLANT
TAPE CLOTH SURG 6X10 WHT LF (GAUZE/BANDAGES/DRESSINGS) ×2 IMPLANT
TOWEL OR 17X26 10 PK STRL BLUE (TOWEL DISPOSABLE) ×3 IMPLANT
TOWEL OR NON WOVEN STRL DISP B (DISPOSABLE) ×3 IMPLANT
TRAY FOLEY W/METER SILVER 16FR (SET/KITS/TRAYS/PACK) ×3 IMPLANT
YANKAUER SUCT BULB TIP NO VENT (SUCTIONS) ×1 IMPLANT

## 2016-07-17 NOTE — Transfer of Care (Signed)
Immediate Anesthesia Transfer of Care Note  Patient: Albert Richards  Procedure(s) Performed: Procedure(s): EXPLORATORY LAPAROTOMY, EVACUATION OF HEMATOMA (N/A)  Patient Location: PACU  Anesthesia Type:General  Level of Consciousness: sedated  Airway & Oxygen Therapy: Patient Spontanous Breathing and Patient connected to face mask oxygen  Post-op Assessment: Report given to RN and Post -op Vital signs reviewed and stable  Post vital signs: Reviewed and stable  Last Vitals:  Vitals:   07/17/16 1414 07/17/16 2027  BP:  128/61  Pulse:  74  Resp:  18  Temp: 36.4 C 37.2 C    Last Pain:  Vitals:   07/17/16 2027  TempSrc: Oral  PainSc:       Patients Stated Pain Goal: 5 ([id number])  Complications: No apparent anesthesia complications

## 2016-07-17 NOTE — Anesthesia Procedure Notes (Signed)
Procedure Name: Intubation Date/Time: 07/17/2016 10:13 PM Performed by: Dione Booze Pre-anesthesia Checklist: Patient identified, Emergency Drugs available, Suction available and Patient being monitored Patient Re-evaluated:Patient Re-evaluated prior to inductionOxygen Delivery Method: Circle system utilized Preoxygenation: Pre-oxygenation with 100% oxygen Intubation Type: IV induction, Rapid sequence and Cricoid Pressure applied Laryngoscope Size: Glidescope and 4 Grade View: Grade II Tube type: Oral Tube size: 7.5 mm Number of attempts: 1 Airway Equipment and Method: Stylet and Video-laryngoscopy Placement Confirmation: ETT inserted through vocal cords under direct vision,  positive ETCO2 and breath sounds checked- equal and bilateral Secured at: 22 cm Tube secured with: Tape Dental Injury: Teeth and Oropharynx as per pre-operative assessment  Comments: Previous glidescope use

## 2016-07-17 NOTE — Progress Notes (Signed)
[facility] Progress Note  6 Days Post-Op  Subjective: Increased abdominal discomfort today. Last episode of flatus was yesterday evening. Reports increased belching. SOB secondary to abdominal distention. Denies fever, chills, productive cough, nausea, vomiting, or dysuria. Using IS and ambulating.   Objective: Vital signs in last 24 hours: Temp:  [98 F (36.7 C)-98.7 F (37.1 C)] 98.7 F (37.1 C) (12/20 0443) Pulse Rate:  [80-82] 80 (12/20 0443) Resp:  [18-19] 18 (12/20 0443) BP: (110-125)/(62-76) 110/68 (12/20 0443) SpO2:  [92 %-95 %] 95 % (12/20 0443) Last BM Date: 07/15/16  Intake/Output from previous day: 12/19 0701 - 12/20 0700 In: [id number] [P.O.:240; I.V.:1200; IV Piggyback:115] Out: -  Intake/Output this shift: No intake/output data recorded.  PE: Gen:  Alert, NAD, cooperative.  Pulm:  CTAB w/ decreased breath sounds in bilateral bases, no W/R/R Abd: Soft, appropriately tender, distended, hypoactive BS, midline incision c/d/i - wound base dry and granulating nicely with no slough/necrosis. Ext:  No erythema, edema, or tenderness   Lab Results:   Recent Labs  07/16/16 0534 07/17/16 0412  WBC 15.9* 16.9*  HGB 9.4* 10.2*  HCT 27.0* 29.7*  PLT 353 472*   BMET  Recent Labs  07/16/16 0534 07/17/16 0412  NA 137 135  K 3.4* 3.4*  CL 104 103  CO2 26 27  GLUCOSE 122* 162*  BUN 12 12  CREATININE 0.81 0.82  CALCIUM 7.8* 7.8*   CMP     Component Value Date/Time   NA 135 07/17/2016 0412   NA 140 04/08/2016 1112   K 3.4 (L) 07/17/2016 0412   K 4.3 04/08/2016 1112   CL 103 07/17/2016 0412   CO2 27 07/17/2016 0412   CO2 26 04/08/2016 1112   GLUCOSE 162 (H) 07/17/2016 0412   GLUCOSE 137 04/08/2016 1112   BUN 12 07/17/2016 0412   BUN 16.8 04/08/2016 1112   CREATININE 0.82 07/17/2016 0412   CREATININE 1.1 04/08/2016 1112   CALCIUM 7.8 (L) 07/17/2016 0412   CALCIUM 9.3 04/08/2016 1112   PROT 7.3 07/10/2016 2206   PROT 6.7 07/20/2013 1432   ALBUMIN 3.9 07/10/2016 2206   ALBUMIN 3.4 (L) 07/20/2013 1432   AST 20 07/10/2016 2206   AST 15 07/20/2013 1432   ALT 22 07/10/2016 2206   ALT 15 07/20/2013 1432   ALKPHOS 228 (H) 07/10/2016 2206   ALKPHOS 81 07/20/2013 1432   BILITOT 0.5 07/10/2016 2206   BILITOT 0.40 07/20/2013 1432   GFRNONAA >60 07/17/2016 0412   GFRAA >60 07/17/2016 0412   Lipase     Component Value Date/Time   LIPASE 19 07/10/2016 2206   Anti-infectives: Anti-infectives    Start     Dose/Rate Route Frequency Ordered Stop   07/11/16 1000  piperacillin-tazobactam (ZOSYN) IVPB 3.375 g     3.375 g 12.5 mL/hr over 240 Minutes Intravenous Every 8 hours 07/11/16 0705 07/16/16 0528   07/11/16 0030  piperacillin-tazobactam (ZOSYN) IVPB 3.375 g     3.375 g 12.5 mL/hr over 240 Minutes Intravenous  Once 07/11/16 0028 07/11/16 0244     Assessment/Plan Small bowel perforation in the setting of metastatic pancreatic adenocarcinoma S/P EXPLORATORY LAPAROTOMY, SEGMENTAL SMALL BOWEL RESECTION, 07/11/16 Dr. Autumn Messing III POD#6 Surgical path - small bowel w/ full thickness adenocarcinoma, margins negative for dysplasia/tumor, all 8 nodes negative. WBC trending up - 16.9 today post-op ileus: No longer passing flatus, increased belching today  Pancreatic cancer- last dose chemotherapy 07/02/16 Diabetes Mellitus - Moderate SSI Hypothyroidism - levothyroxine 175 mcg COPD  GERD - protonix 40mg   FEN:  IVF and back off diet to clear liquids ID: Zosyn 12/14>>12/19 VTE: heparin/ SCD's  Plan: 1. worsening leukocytosis - will order CXR and UA to rule out possible PNA or UTI; higher suspicion for intra-abdominal source. If CXR and UA negative will consider repeat CT scan abd/pelv.  2. Post-op ileus - back off diet to clear liquids, continue dulcolax, ambulate 3. Mild hypokalemia - 3.4, will replace potassium today  RECHECK - UA negative. CXR significant for low lung volumes,  pleural effusion R>L, and possible RLL opacity. Will diurese with 20 mg Lasix. Encourage IS. IV abx were discontinued 24 h ago. Re-start IV abx tonight vs tomorrow after possible repeat imaging of the abdomen - will discuss with MD.    LOS: 6 days    Jill Alexanders , [facility] 07/17/2016, 9:13 AM Pager: [phone] Consults: [phone] Mon-Fri 7:00 am-4:30 pm Sat-Sun 7:00 am-11:30 am

## 2016-07-17 NOTE — Progress Notes (Signed)
OT Cancellation Note  Patient Details Name: Albert Richards MRN: [id number] DOB: 1941/06/17   Cancelled Treatment:    Reason Eval/Treat Not Completed: Other (comment) Pt had pain meds then became nauseous and had zofran- pt not feeling well although he is sitting up in chair.  Declined OT- will check back next day  Esto, Thereasa Parkin 07/17/2016, 2:02 PM

## 2016-07-17 NOTE — Op Note (Signed)
Preoperative diagnosis: concern for anastomotic leak  Postoperative diagnosis: post operative hematoma  Procedure: reopening of recent laparotomy, evacuation of hematoma Surgeon: Gurney Maxin, M.D.  Asst: none  Anesthesia: general  Indications for procedure: Albert Richards is a 75 y.o. year old male with symptoms of abdominal, nausea, leukocytosis. He underwent CT scan showing free fluid around liver and small bowel concerning for anastomotic leak. Findings were discussed and patient brought to operative suite.  Description of procedure: The patient was brought into the operative suite. Anesthesia was administered with General endotracheal anesthesia. WHO checklist was applied. The patient was then placed in supine position. The area was prepped and draped in the usual sterile fashion.  Next the previous incision was reopened and the fascial suture cut and removed. Upon entering the abdominal a moderate amount of thin red fluid drained. Upon further exploration of the abdomen there was a large old hematoma along the interior aspect of the mesenteric closure from previous bowel anastomosis. The small intestine was inspected for other abnormality, the previous anastomosis was inspected and no leak was seen. The crotch of the anastomosis seemed to be under some tension and an additional suture was placed for reinforcement. Large amount of irrigation used to evacuate the hematoma. 2 19 Fr blake drains were placed through the right abdomen. The superior was placed through right and left paracolic gutters and the superior aspect of the anastomosis. The inferior drain was placed into the pelvis and the inferior aspect of the anastomosis. The fascia was then closed with #1 PDS in running fashion. Saline soaked kerlix was placed in the subcutaneous aspect of the wound and dressing put in place.  Findings: large hematoma  Specimen: none  Implant: 2 19 french blake drains   Blood loss: <198ml, 1L  hematoma evacuated  Local anesthesia: none  Complications: none  Gurney Maxin, M.D. General, Bariatric, & Minimally Invasive Surgery [facility]

## 2016-07-17 NOTE — Progress Notes (Signed)
Physical Therapy Treatment Patient Details Name: Albert Richards MRN: [id number] DOB: [birth year] Today's Date: 07/17/2016    History of Present Illness The patient is a 75 year old white male who has a history of metastatic pancreatic cancer for the last 3 years, adm with severe abd pain;  s/p EXPLORATORY LAPAROTOMY  with segmental small bowel resection on 07/11/16;    PT Comments    Assisted out of recliner to amb to bathroom with increased time and RW.  Slow but steady gait.  Greatest difficulty is rising from low level surface due to recent ABD surgery and pain.  Assisted back to bed and positioned to comfort.   Follow Up Recommendations  Home health PT     Equipment Recommendations  Rolling walker with 5" wheels    Recommendations for Other Services       Precautions / Restrictions Precautions Precautions: Fall Precaution Comments: abd incision/wound, Restrictions Weight Bearing Restrictions: No    Mobility  Bed Mobility Overal bed mobility: Needs Assistance Bed Mobility: Sit to Supine       Sit to supine: Max assist   General bed mobility comments: max assist to support B LE up onto bed due to ABD pain and recent surgery  Transfers Overall transfer level: Needs assistance Equipment used: Rolling walker (2 wheeled) Transfers: Sit to/from Stand           General transfer comment: cues for hand placement and use of LEs to power up/contorl descent  Ambulation/Gait Ambulation/Gait assistance: Min assist Ambulation Distance (Feet): 22 Feet Assistive device: Rolling walker (2 wheeled) Gait Pattern/deviations: Step-to pattern;Step-through pattern;Trunk flexed;Decreased step length - right;Decreased step length - left Gait velocity: decreased   General Gait Details: increased time, slow but steady gait   only tolerated walking to and from bathroom   Stairs            Wheelchair Mobility    Modified Rankin (Stroke Patients Only)       Balance                                     Cognition Arousal/Alertness: Awake/alert Behavior During Therapy: WFL for tasks assessed/performed Overall Cognitive Status: Within Functional Limits for tasks assessed                      Exercises      General Comments        Pertinent Vitals/Pain Pain Assessment: Faces Faces Pain Scale: Hurts little more Pain Location: ABD recent surgery Pain Descriptors / Indicators: Discomfort;Grimacing;Operative site guarding Pain Intervention(s): Monitored during session;Repositioned    Home Living                      Prior Function            PT Goals (current goals can now be found in the care plan section) Progress towards PT goals: Progressing toward goals    Frequency    Min 3X/week      PT Plan Current plan remains appropriate    Co-evaluation             End of Session Equipment Utilized During Treatment: Gait belt   Patient left: in bed;with call bell/phone within reach;with bed alarm set;with family/visitor present     Time: [id number] PT Time Calculation (min) (ACUTE ONLY): 16 min  Charges:  $Gait Training: 8-22 mins  G Codes:      Rica Koyanagi  PTA [facility]  Acute  Rehab Pager      [phone]

## 2016-07-17 NOTE — Progress Notes (Signed)
CT results concerning for anastomotic leak. Patient persistent pain throughout the day, no vomiting. WBC increasing. I discussed these findings with family and patient and purposed plan for return to operating room for exploration, possible repair, possible externalization of the problem. We discussed that this is standard of care for this situation, would likely lengthen hospital course and lead to other issues. We discussed alternatives of IV abx and waiting, which I think would lead to quickened death. The family and patient agreed to proceed with surgery.  Gurney Maxin, M.D. [facility] Pg: [phone]

## 2016-07-17 NOTE — Anesthesia Preprocedure Evaluation (Signed)
Anesthesia Evaluation  Patient identified by MRN, date of birth, ID band Patient awake    Reviewed: Allergy & Precautions, NPO status , Patient's Chart, lab work & pertinent test results  Airway Mallampati: III  TM Distance: >3 FB Neck ROM: Limited    Dental  (+) Dental Advisory Given   Pulmonary COPD, former smoker,  Metastatic pancreatic cancer to the lungs   breath sounds clear to auscultation       Cardiovascular + Peripheral Vascular Disease   Rhythm:Regular Rate:Normal     Neuro/Psych negative neurological ROS     GI/Hepatic Neg liver ROS, Small bowel perforation. Pancreatic CA on chemo   Endo/Other  diabetes, Type 2Hypothyroidism   Renal/GU Renal InsufficiencyRenal diseasenegative Renal ROS     Musculoskeletal  (+) Arthritis ,   Abdominal   Peds  Hematology  (+) Blood dyscrasia (Thrombocytopenia), ,   Anesthesia Other Findings   Reproductive/Obstetrics                             Lab Results  Component Value Date   WBC 16.9 (H) 07/17/2016   HGB 10.2 (L) 07/17/2016   HCT 29.7 (L) 07/17/2016   MCV 95.5 07/17/2016   PLT 472 (H) 07/17/2016   Lab Results  Component Value Date   CREATININE 0.82 07/17/2016   BUN 12 07/17/2016   NA 135 07/17/2016   K 3.4 (L) 07/17/2016   CL 103 07/17/2016   CO2 27 07/17/2016   Lab Results  Component Value Date   INR 1.09 07/11/2016   INR 1.03 01/08/2016    Anesthesia Physical  Anesthesia Plan  ASA: III and emergent  Anesthesia Plan: General   Post-op Pain Management:    Induction: Intravenous, Rapid sequence and Cricoid pressure planned  Airway Management Planned: Oral ETT and Video Laryngoscope Planned  Additional Equipment:   Intra-op Plan:   Post-operative Plan: Possible Post-op intubation/ventilation  Informed Consent: I have reviewed the patients History and Physical, chart, labs and discussed the procedure including  the risks, benefits and alternatives for the proposed anesthesia with the patient or authorized representative who has indicated his/her understanding and acceptance.   Dental advisory given  Plan Discussed with: CRNA  Anesthesia Plan Comments:         Anesthesia Quick Evaluation

## 2016-07-18 ENCOUNTER — Encounter (HOSPITAL_COMMUNITY): Payer: Self-pay | Admitting: General Surgery

## 2016-07-18 LAB — GLUCOSE, CAPILLARY
GLUCOSE-CAPILLARY: 133 mg/dL — AB (ref 65–99)
GLUCOSE-CAPILLARY: 144 mg/dL — AB (ref 65–99)
GLUCOSE-CAPILLARY: 145 mg/dL — AB (ref 65–99)
GLUCOSE-CAPILLARY: 155 mg/dL — AB (ref 65–99)
Glucose-Capillary: 164 mg/dL — ABNORMAL HIGH (ref 65–99)
Glucose-Capillary: 154 mg/dL — ABNORMAL HIGH (ref 65–99)

## 2016-07-18 MED ORDER — LEVOTHYROXINE SODIUM 100 MCG IV SOLR
87.5000 ug | INTRAVENOUS | Status: DC
  Administered 2016-07-19 – 2016-07-23 (×3): 87.5 ug via INTRAVENOUS
  Filled 2016-07-18 (×3): qty 5

## 2016-07-18 MED ORDER — LEVOFLOXACIN IN D5W 750 MG/150ML IV SOLN
750.0000 mg | INTRAVENOUS | Status: DC
  Filled 2016-07-18: qty 150

## 2016-07-18 MED ORDER — PIPERACILLIN-TAZOBACTAM 3.375 G IVPB
3.3750 g | Freq: Three times a day (TID) | INTRAVENOUS | Status: DC
  Administered 2016-07-18: 3.375 g via INTRAVENOUS
  Filled 2016-07-18 (×4): qty 50

## 2016-07-18 MED ORDER — ACETAMINOPHEN 10 MG/ML IV SOLN
1000.0000 mg | Freq: Once | INTRAVENOUS | Status: AC
  Administered 2016-07-18: 1000 mg via INTRAVENOUS

## 2016-07-18 MED ORDER — ACETAMINOPHEN 10 MG/ML IV SOLN
INTRAVENOUS | Status: AC
  Filled 2016-07-18: qty 100

## 2016-07-18 MED ORDER — PIPERACILLIN-TAZOBACTAM 3.375 G IVPB
3.3750 g | Freq: Three times a day (TID) | INTRAVENOUS | Status: DC
  Administered 2016-07-18 – 2016-07-26 (×25): 3.375 g via INTRAVENOUS
  Filled 2016-07-18 (×28): qty 50

## 2016-07-18 MED ORDER — PIPERACILLIN-TAZOBACTAM 3.375 G IVPB: 3.3750 g | Freq: Three times a day (TID) | INTRAVENOUS | Status: DC

## 2016-07-18 MED ORDER — PANTOPRAZOLE SODIUM 40 MG IV SOLR
40.0000 mg | Freq: Every day | INTRAVENOUS | Status: DC
Start: 2016-07-18 — End: 2016-07-25
  Administered 2016-07-18 – 2016-07-24 (×7): 40 mg via INTRAVENOUS
  Filled 2016-07-18 (×7): qty 40

## 2016-07-18 MED ORDER — LEVOTHYROXINE SODIUM 100 MCG IV SOLR
75.0000 ug | INTRAVENOUS | Status: DC
  Administered 2016-07-18 – 2016-07-22 (×3): 75 ug via INTRAVENOUS
  Filled 2016-07-18 (×4): qty 5

## 2016-07-18 MED ORDER — PIPERACILLIN-TAZOBACTAM 4.5 G IVPB: 4.5000 g | Freq: Four times a day (QID) | INTRAVENOUS | Status: DC

## 2016-07-18 NOTE — Progress Notes (Signed)
Patient ID: Albert Richards, male   DOB: 1940/10/22, 75 y.o.   MRN: [id number]  [facility] Progress Note  1 Day Post-Op  Subjective: Tired this morning. Reports abdominal soreness and no flatus. Denies n/v.  Objective: Vital signs in last 24 hours: Temp:  [97.4 F (36.3 C)-98.9 F (37.2 C)] 97.7 F (36.5 C) (12/21 0445) Pulse Rate:  [74-97] 91 (12/21 0445) Resp:  [14-26] 16 (12/21 0445) BP: (107-144)/(61-84) 107/84 (12/21 0445) SpO2:  [90 %-99 %] 96 % (12/21 0445) Last BM Date: 07/15/16  Intake/Output from previous day: 12/20 0701 - 12/21 0700 In: 2941.3 [P.O.:428; I.V.:1928.3; NG/GT:55; IV Piggyback:530] Out: 1390 [Urine:1100; Emesis/NG output:50; Drains:190; Blood:50] Intake/Output this shift: No intake/output data recorded.  PE: Gen:  Alert, NAD, cooperative.  Pulm:  CTAB w/ decreased breath sounds in bilateral bases, no W/R/R, effort normal Abd: Soft, appropriately tender, distended, present but hypoactive BS, midline incision covered with clean/dry dressing Ext:  No erythema, edema, or tenderness   IS - 600  Lab Results:   Recent Labs  07/16/16 0534 07/17/16 0412  WBC 15.9* 16.9*  HGB 9.4* 10.2*  HCT 27.0* 29.7*  PLT 353 472*   BMET  Recent Labs  07/16/16 0534 07/17/16 0412  NA 137 135  K 3.4* 3.4*  CL 104 103  CO2 26 27  GLUCOSE 122* 162*  BUN 12 12  CREATININE 0.81 0.82  CALCIUM 7.8* 7.8*   PT/INR No results for input(s): LABPROT, INR in the last 72 hours. CMP     Component Value Date/Time   NA 135 07/17/2016 0412   NA 140 04/08/2016 1112   K 3.4 (L) 07/17/2016 0412   K 4.3 04/08/2016 1112   CL 103 07/17/2016 0412   CO2 27 07/17/2016 0412   CO2 26 04/08/2016 1112   GLUCOSE 162 (H) 07/17/2016 0412   GLUCOSE 137 04/08/2016 1112   BUN 12 07/17/2016 0412   BUN 16.8 04/08/2016 1112   CREATININE 0.82 07/17/2016 0412   CREATININE 1.1 04/08/2016 1112   CALCIUM 7.8 (L) 07/17/2016 0412   CALCIUM 9.3 04/08/2016 1112   PROT  7.3 07/10/2016 2206   PROT 6.7 07/20/2013 1432   ALBUMIN 3.9 07/10/2016 2206   ALBUMIN 3.4 (L) 07/20/2013 1432   AST 20 07/10/2016 2206   AST 15 07/20/2013 1432   ALT 22 07/10/2016 2206   ALT 15 07/20/2013 1432   ALKPHOS 228 (H) 07/10/2016 2206   ALKPHOS 81 07/20/2013 1432   BILITOT 0.5 07/10/2016 2206   BILITOT 0.40 07/20/2013 1432   GFRNONAA >60 07/17/2016 0412   GFRAA >60 07/17/2016 0412   Lipase     Component Value Date/Time   LIPASE 19 07/10/2016 2206       Studies/Results: Dg Chest 2 View  Result Date: 07/17/2016 CLINICAL DATA:  75 year old male with a history of shortness of breath EXAM: CHEST  2 VIEW COMPARISON:  CT 04/22/2016, 12/29/2015, chest x-ray 07/30/2013 FINDINGS: Cardiomediastinal silhouette relatively unchanged. Unchanged position of left subclavian approach central venous catheter. Low lung volumes. Blunting of the costophrenic sulcus on the lateral view with blunting of the right cardiophrenic angle and costophrenic angle. Opacity at the right base partially obscuring the right hemidiaphragm. Minimal blunting of the left costophrenic angle. No pneumothorax. No interlobular septal thickening. IMPRESSION: New right basilar opacity, likely a combination of pleural effusion, atelectasis, and/ or consolidation. Evidence of trace left-sided pleural effusion. Unchanged position of left subclavian approach central venous catheter. Signed, Dulcy Fanny. Earleen Newport, DO [facility] Electronically Signed   By: Corrie Mckusick D.O.   On: 07/17/2016 11:39   Ct Abdomen Pelvis W Contrast  Result Date: 07/17/2016 CLINICAL DATA:  Bowel perforation. EXAM: CT ABDOMEN AND PELVIS WITH CONTRAST TECHNIQUE: Multidetector CT imaging of the abdomen and pelvis was performed using the standard protocol following bolus administration of intravenous contrast. CONTRAST:  151mL ISOVUE-300 IOPAMIDOL (ISOVUE-300) INJECTION 61% COMPARISON:  CT of the  abdomen and pelvis 07/10/2016. FINDINGS: Lower chest: Bilateral pleural effusions are present. Right greater than left lower lobe collapse likely reflects atelectasis with significant volume loss. 11 mm ill-defined nodule was press anteriorly in the right upper lobe, pleural based. The heart size is normal. Coronary artery calcifications are present. No significant pericardial effusion is evident. Hepatobiliary: No discrete hepatic lesions are present. Extrahepatic fluid creates some mass effect on the liver capsule. Common bile duct and gallbladder are normal. Pancreas: The pancreas is atrophic. No discrete lesions are present. There is no ductal dilation. Spleen: Within normal limits. Adrenals/Urinary Tract: The adrenal glands are normal bilaterally. A right-sided renal cysts are again noted. Ureters are unremarkable. There is no obstruction. The urinary bladder is within normal limits. Stomach/Bowel: The stomach and duodenum are within normal limits. The patient is status post small bowel and tumor resection. Anastomosis is identified. There is a small amount of gas adjacent to the anastomosis. Extensive layering heterogeneous fluid is present throughout the mesenteries. High-density material is noted adjacent to the anastomosis. There is also some mass effect on the liver capsule. Heterogeneous fluid is present throughout the mesentery extending into the pelvis. Contrast can be followed into the distal ileum. The ascending and transverse colon are within normal limits. The descending and sigmoid colon are mostly collapsed. Two locules of fluid are seen adjacent to small bowel in the left side of the abdomen measuring 8 and 12 mm respectively. An 11 mm collection in an adjacent loop of small bowel is also noted. This is in the region of the previous tumor resection more there was infection. There is fluid along the peritoneal numb which is somewhat loculated and diffuse inflammatory change throughout the  mesentery in this area. Vascular/Lymphatic: Minimal atherosclerotic calcifications are present without aneurysm. Renal arteries are duplicated bilaterally. Reproductive: Prostate is mildly enlarged without change. Musculoskeletal: Hemangiomas are again noted. Vertebral body heights and alignment are otherwise unremarkable. The bony pelvis is intact. IMPRESSION: 1. Anastomotic leak with heterogeneous fluid layering throughout the mesentery. 2. The fluid appears loculated adjacent to the peritoneal on the left and creates mass effect on the liver. This suggests infection. 3. At least 3 small locules of fluid are seen adjacent to small bowel near the resection site. Abscess was noted adjacent to the resected tumor. These are highly concerning for recurrent small abscess. 4. A small amount of gas is noted adjacent to the anastomosis, also suggesting leak. 5. Bilateral pleural effusions and associated atelectasis. 6. Minimal atherosclerotic changes. These results were called by telephone at the time of interpretation on 07/17/2016 at 7:36 pm to Dr. Kieth Brightly, who verbally acknowledged these results. Electronically Signed   By: San Morelle M.D.   On: 07/17/2016 19:45    Anti-infectives: Anti-infectives    Start     Dose/Rate Route Frequency Ordered Stop   07/18/16 1200  piperacillin-tazobactam (ZOSYN) IVPB 4.5 g  Status:  Discontinued     4.5 g 200 mL/hr over 30 Minutes Intravenous Every 6 hours 07/18/16 0729 07/18/16 0730   07/18/16 0830  piperacillin-tazobactam (ZOSYN) IVPB  3.375 g     3.375 g 12.5 mL/hr over 240 Minutes Intravenous Every 8 hours 07/18/16 0732     07/18/16 0745  piperacillin-tazobactam (ZOSYN) IVPB 3.375 g  Status:  Discontinued     3.375 g 12.5 mL/hr over 240 Minutes Intravenous Every 8 hours 07/18/16 0730 07/18/16 0732   07/18/16 0730  levofloxacin (LEVAQUIN) IVPB 750 mg     750 mg 100 mL/hr over 90 Minutes Intravenous Every 24 hours 07/18/16 0729     07/18/16 0030   piperacillin-tazobactam (ZOSYN) IVPB 3.375 g  Status:  Discontinued     3.375 g 12.5 mL/hr over 240 Minutes Intravenous Every 8 hours 07/18/16 0023 07/18/16 0729   07/17/16 1600  piperacillin-tazobactam (ZOSYN) IVPB 3.375 g  Status:  Discontinued     3.375 g 12.5 mL/hr over 240 Minutes Intravenous Every 8 hours 07/17/16 1515 07/18/16 0023   07/11/16 1000  piperacillin-tazobactam (ZOSYN) IVPB 3.375 g     3.375 g 12.5 mL/hr over 240 Minutes Intravenous Every 8 hours 07/11/16 0705 07/16/16 0528   07/11/16 0030  piperacillin-tazobactam (ZOSYN) IVPB 3.375 g     3.375 g 12.5 mL/hr over 240 Minutes Intravenous  Once 07/11/16 0028 07/11/16 0244       Assessment/Plan Small bowel perforation in the setting of metastatic pancreatic adenocarcinoma S/P EXPLORATORY LAPAROTOMY, SEGMENTAL SMALL BOWEL RESECTION, 07/11/16 Dr. Autumn Messing III - POD#7 - Surgical path - small bowel w/ full thickness adenocarcinoma, margins negative for dysplasia/tumor, all 8 nodes negative.  S/p reopening of recent laparotomy, evacuation of hematoma 12/20 Dr. Kieth Brightly - POD 1 - no leak found intraoperatively - drain (1) 25cc serosanguinous, drain (2) 165cc serosanguinous  Possible HCAP Pneumonia - continue zosyn. Spoke with pharmacy, do not need cross coverage for HCAP at [facility], keep on just zosyn for now, consider adding vanc for MRSA coverage if not improving.  Pancreatic cancer- last dose chemotherapy 07/02/16 Diabetes Mellitus - Moderate SSI Hypothyroidism - IV levothyroxine 87.27mcg Sun/M/W/F and 51mcg T/Th/Sat COPD GERD - IV protonix 40mg   FEN:  IVF, NG/NPO ID: Zosyn 12/14>>12/19, zosyn 12/20>> VTE: heparin/ SCD's  Plan: Ileus persists. Continue NPO/NGT. Encourage patient to use IS more frequently. Continue zosyn. Plan to change dressing tomorrow. Will discuss TPN with MD. CBC/BMP in AM.   LOS: 7 days    Jerrye Beavers , [facility] 07/18/2016, 8:26 AM Pager: [phone] Consults:  [phone] Mon-Fri 7:00 am-4:30 pm Sat-Sun 7:00 am-11:30 am

## 2016-07-18 NOTE — Progress Notes (Signed)
PT Cancellation Note  Patient Details Name: Albert Richards MRN: [id number] DOB: Oct 28, 1940   Cancelled Treatment:     RN reports pt received 2 new drains and is in a lot pain.     Rica Koyanagi  PTA [facility]  Acute  Rehab Pager      [phone]

## 2016-07-18 NOTE — Progress Notes (Signed)
OT Cancellation Note  Patient Details Name: Albert Richards MRN: [id number] DOB: Dec 25, 1940   Cancelled Treatment:    Reason Eval/Treat Not Completed: Fatigue/lethargy limiting ability to participate;Pain limiting ability to participate  Pt and wife stated MD had encouraged pt to sit up this day.  Pt has had morphine but declined sitting up with OT at this time.  Will recheck  On pt later in day or next day Kari Baars, [place]  Payton Mccallum D 07/18/2016, 11:35 AM

## 2016-07-19 LAB — BASIC METABOLIC PANEL
Anion gap: 9 (ref 5–15)
BUN: 18 mg/dL (ref 6–20)
CALCIUM: 7.4 mg/dL — AB (ref 8.9–10.3)
CO2: 26 mmol/L (ref 22–32)
CREATININE: 0.95 mg/dL (ref 0.61–1.24)
Chloride: 100 mmol/L — ABNORMAL LOW (ref 101–111)
GFR calc Af Amer: >60 mL/min (ref >60)
GFR calc non Af Amer: >60 mL/min (ref >60)
GLUCOSE: 143 mg/dL — AB (ref 65–99)
Potassium: 4.1 mmol/L (ref 3.5–5.1)
Sodium: 135 mmol/L (ref 135–145)

## 2016-07-19 LAB — GLUCOSE, CAPILLARY
Glucose-Capillary: 134 mg/dL — ABNORMAL HIGH (ref 65–99)
Glucose-Capillary: 122 mg/dL — ABNORMAL HIGH (ref 65–99)
Glucose-Capillary: 148 mg/dL — ABNORMAL HIGH (ref 65–99)
Glucose-Capillary: 128 mg/dL — ABNORMAL HIGH (ref 65–99)
Glucose-Capillary: 139 mg/dL — ABNORMAL HIGH (ref 65–99)

## 2016-07-19 LAB — CBC
HEMATOCRIT: 30.9 % — AB (ref 39.0–52.0)
Hemoglobin: 10.8 g/dL — ABNORMAL LOW (ref 13.0–17.0)
MCH: 33 pg (ref 26.0–34.0)
MCHC: 35 g/dL (ref 30.0–36.0)
MCV: 94.5 fL (ref 78.0–100.0)
PLATELETS: 653 10*3/uL — AB (ref 150–400)
RBC: 3.27 MIL/uL — ABNORMAL LOW (ref 4.22–5.81)
RDW: 17 % — AB (ref 11.5–15.5)
WBC: 25.1 10*3/uL — ABNORMAL HIGH (ref 4.0–10.5)

## 2016-07-19 MED ORDER — VANCOMYCIN HCL 10 G IV SOLR
1500.0000 mg | Freq: Once | INTRAVENOUS | Status: AC
  Administered 2016-07-20: 1500 mg via INTRAVENOUS
  Filled 2016-07-19: qty 1500

## 2016-07-19 MED ORDER — ALTEPLASE 2 MG IJ SOLR
2.0000 mg | Freq: Once | INTRAMUSCULAR | Status: AC
  Administered 2016-07-19: 2 mg
  Filled 2016-07-19: qty 2

## 2016-07-19 MED ORDER — VANCOMYCIN HCL 10 G IV SOLR
1250.0000 mg | Freq: Two times a day (BID) | INTRAVENOUS | Status: DC
  Administered 2016-07-20 – 2016-07-26 (×13): 1250 mg via INTRAVENOUS
  Filled 2016-07-19: qty 1000
  Filled 2016-07-19 (×13): qty 1250

## 2016-07-19 NOTE — Progress Notes (Signed)
Pt's porta cath is clotted, so IV nurse is going to instill TPA and get the labs a little later.

## 2016-07-19 NOTE — Progress Notes (Addendum)
Pharmacy Antibiotic Note  Albert Richards is a 75 y.o. male with hx metastatic pancreatic cancer admitted on 07/10/2016 with severe abdominal pain. CT showed free air and worsening metastatic disease with small bowel perforation. Pt underwent s/p exploratory lap and segmental small bowel resection 12/14. Pharmacy has been consulted for zosyn dosing.    Today, 07/19/2016  Respiratory status stable/improved, except for reduced breath sounds on physical; no CXR since 12/20  New bump in WBC  Surgery restarting Zosyn and adding vancomycin for HCAP  Plan:  Vancomycin 1500 mg IV now, then 1250 mg IV q12 hr; goal trough 15-20 mcg/mL  Measure vancomycin trough levels at steady state as indicated  Continue Zosyn 3.375 g IV every 8 hrs by 4-hr infusion.   Height: 5\' 10"  (177.8 cm) Weight: 215 lb (97.5 kg) IBW/kg (Calculated) : 73  Temp (24hrs), Avg:98.5 F (36.9 C), Min:98.1 F (36.7 C), Max:98.9 F (37.2 C)   Recent Labs Lab 07/13/16 0649 07/15/16 0347 07/16/16 0534 07/17/16 0412 07/19/16 1013  WBC 32.6* 14.8* 15.9* 16.9* 25.1*  CREATININE 1.06 0.95 0.81 0.82 0.95    Estimated Creatinine Clearance: 77.5 mL/min (by C-G formula based on SCr of 0.95 mg/dL).    No Known Allergies  Antimicrobials this admission: 12/14 Zosyn >> 12/19; resumed 12/20 >> 12/23 Vancomycin >>   Dose adjustments this admission: ---   Microbiology results: 12/14: MRSA PCR: neg   Thank you for allowing pharmacy to be a part of this patient's care.  Reuel Boom, PharmD, BCPS Pager: [phone] 07/19/2016, 5:34 PM

## 2016-07-19 NOTE — Anesthesia Postprocedure Evaluation (Signed)
Anesthesia Post Note  Patient: Albert Richards  Procedure(s) Performed: Procedure(s) (LRB): EXPLORATORY LAPAROTOMY, EVACUATION OF HEMATOMA (N/A)  Patient location during evaluation: PACU Anesthesia Type: General Level of consciousness: sedated and patient cooperative Pain management: pain level controlled Vital Signs Assessment: post-procedure vital signs reviewed and stable Respiratory status: spontaneous breathing Cardiovascular status: stable Anesthetic complications: no       Last Vitals:  Vitals:   07/19/16 0207 07/19/16 0506  BP: 109/60 104/61  Pulse: 91 86  Resp: 16 18  Temp: 37.1 C 36.7 C    Last Pain:  Vitals:   07/19/16 0506  TempSrc: Axillary  PainSc:                  Nolon Nations

## 2016-07-19 NOTE — Progress Notes (Signed)
PT Cancellation Note  Patient Details Name: Albert Richards MRN: [id number] DOB: Jul 31, 1940   Cancelled Treatment:    Reason Eval/Treat Not Completed: Attempted PT tx session. Family declined PT today. Wife stated pt had recently sat up in chair for 3 hours. Will check back another day. Recommend nursing mobilize pt OOB over weekend. Thanks.   Weston Anna, MPT Pager: [phone]

## 2016-07-19 NOTE — Progress Notes (Signed)
Pharmacy - TPN  Assessment: Patient ordered TPN; PICC unable to be placed until tomorrow, 12/23  Plan: have ordered labs for tomorrow; f/u dietician recommendations for TPN  Reuel Boom, PharmD, BCPS Pager: [phone] 07/19/2016, 5:04 PM

## 2016-07-19 NOTE — Care Management Important Message (Signed)
Important Message  Patient Details  Name: Albert Richards MRN: [id number] Date of Birth: May 08, 1941   Medicare Important Message Given:  Yes    Kerin Salen 07/19/2016, 11:38 AMImportant Message  Patient Details  Name: Albert Richards MRN: [id number] Date of Birth: [birth year]   Medicare Important Message Given:  Yes    Kerin Salen 07/19/2016, 11:38 AM

## 2016-07-19 NOTE — Progress Notes (Signed)
Patient ID: Albert [facility], male   DOB: 1941/07/21, 75 y.o.   MRN: [id number]  [facility] Surgery Progress Note  2 Days Post-Op  Subjective: Was not able to get out of bed yesterday, but he is having less abdominal pain today. Denies n/v. Not passing any flatus. Denies cough or SOB.  Objective: Vital signs in last 24 hours: Temp:  [98.1 F (36.7 C)-98.9 F (37.2 C)] 98.1 F (36.7 C) (12/22 0506) Pulse Rate:  [86-102] 86 (12/22 0506) Resp:  [16-18] 18 (12/22 0506) BP: (100-109)/(51-66) 104/61 (12/22 0506) SpO2:  [90 %-98 %] 90 % (12/22 0506) Last BM Date: 07/15/16  Intake/Output from previous day: 12/21 0701 - 12/22 0700 In: 2052.5 [P.O.:480; I.V.:1200; IV Piggyback:372.5] Out: 2190 [Urine:850; Emesis/NG output:1150; Drains:190] Intake/Output this shift: No intake/output data recorded.  PE: Gen: Alert, NAD, cooperative.  Pulm: CTAB w/ decreased breath sounds in bilateral bases, no W/R/R, effort normal Abd: Soft, appropriately tender, distended, present but hypoactive BS, open midline incision healing well with beefy red tissue and no drainage or erythema Ext: No erythema, edema, or tenderness   IS - 1000  Lab Results:   Recent Labs  07/17/16 0412  WBC 16.9*  HGB 10.2*  HCT 29.7*  PLT 472*   BMET  Recent Labs  07/17/16 0412  NA 135  K 3.4*  CL 103  CO2 27  GLUCOSE 162*  BUN 12  CREATININE 0.82  CALCIUM 7.8*   PT/INR No results for input(s): LABPROT, INR in the last 72 hours. CMP     Component Value Date/Time   NA 135 07/17/2016 0412   NA 140 04/08/2016 1112   K 3.4 (L) 07/17/2016 0412   K 4.3 04/08/2016 1112   CL 103 07/17/2016 0412   CO2 27 07/17/2016 0412   CO2 26 04/08/2016 1112   GLUCOSE 162 (H) 07/17/2016 0412   GLUCOSE 137 04/08/2016 1112   BUN 12 07/17/2016 0412   BUN 16.8 04/08/2016 1112   CREATININE 0.82 07/17/2016 0412   CREATININE 1.1 04/08/2016 1112   CALCIUM 7.8 (L) 07/17/2016 0412   CALCIUM 9.3 04/08/2016 1112   PROT 7.3 07/10/2016 2206   PROT 6.7 07/20/2013 1432   ALBUMIN 3.9 07/10/2016 2206   ALBUMIN 3.4 (L) 07/20/2013 1432   AST 20 07/10/2016 2206   AST 15 07/20/2013 1432   ALT 22 07/10/2016 2206   ALT 15 07/20/2013 1432   ALKPHOS 228 (H) 07/10/2016 2206   ALKPHOS 81 07/20/2013 1432   BILITOT 0.5 07/10/2016 2206   BILITOT 0.40 07/20/2013 1432   GFRNONAA >60 07/17/2016 0412   GFRAA >60 07/17/2016 0412   Lipase     Component Value Date/Time   LIPASE 19 07/10/2016 2206       Studies/Results: Dg Chest 2 View  Result Date: 07/17/2016 CLINICAL DATA:  75 year old male with a history of shortness of breath EXAM: CHEST  2 VIEW COMPARISON:  CT 04/22/2016, 12/29/2015, chest x-ray 07/30/2013 FINDINGS: Cardiomediastinal silhouette relatively unchanged. Unchanged position of left subclavian approach central venous catheter. Low lung volumes. Blunting of the costophrenic sulcus on the lateral view with blunting of the right cardiophrenic angle and costophrenic angle. Opacity at the right base partially obscuring the right hemidiaphragm. Minimal blunting of the left costophrenic angle. No pneumothorax. No interlobular septal thickening. IMPRESSION: New right basilar opacity, likely a combination of pleural effusion, atelectasis, and/ or consolidation. Evidence of trace left-sided pleural effusion. Unchanged position of left subclavian approach central venous catheter. Signed, Dulcy Fanny. Earleen Newport, DO [facility] Electronically Signed   By: Corrie Mckusick D.O.   On: 07/17/2016 11:39   Ct Abdomen Pelvis W Contrast  Result Date: 07/17/2016 CLINICAL DATA:  Bowel perforation. EXAM: CT ABDOMEN AND PELVIS WITH CONTRAST TECHNIQUE: Multidetector CT imaging of the abdomen and pelvis was performed using the standard protocol following bolus administration of intravenous contrast. CONTRAST:  196mL ISOVUE-300 IOPAMIDOL (ISOVUE-300) INJECTION 61% COMPARISON:  CT of  the abdomen and pelvis 07/10/2016. FINDINGS: Lower chest: Bilateral pleural effusions are present. Right greater than left lower lobe collapse likely reflects atelectasis with significant volume loss. 11 mm ill-defined nodule was press anteriorly in the right upper lobe, pleural based. The heart size is normal. Coronary artery calcifications are present. No significant pericardial effusion is evident. Hepatobiliary: No discrete hepatic lesions are present. Extrahepatic fluid creates some mass effect on the liver capsule. Common bile duct and gallbladder are normal. Pancreas: The pancreas is atrophic. No discrete lesions are present. There is no ductal dilation. Spleen: Within normal limits. Adrenals/Urinary Tract: The adrenal glands are normal bilaterally. A right-sided renal cysts are again noted. Ureters are unremarkable. There is no obstruction. The urinary bladder is within normal limits. Stomach/Bowel: The stomach and duodenum are within normal limits. The patient is status post small bowel and tumor resection. Anastomosis is identified. There is a small amount of gas adjacent to the anastomosis. Extensive layering heterogeneous fluid is present throughout the mesenteries. High-density material is noted adjacent to the anastomosis. There is also some mass effect on the liver capsule. Heterogeneous fluid is present throughout the mesentery extending into the pelvis. Contrast can be followed into the distal ileum. The ascending and transverse colon are within normal limits. The descending and sigmoid colon are mostly collapsed. Two locules of fluid are seen adjacent to small bowel in the left side of the abdomen measuring 8 and 12 mm respectively. An 11 mm collection in an adjacent loop of small bowel is also noted. This is in the region of the previous tumor resection more there was infection. There is fluid along the peritoneal numb which is somewhat loculated and diffuse inflammatory change throughout the  mesentery in this area. Vascular/Lymphatic: Minimal atherosclerotic calcifications are present without aneurysm. Renal arteries are duplicated bilaterally. Reproductive: Prostate is mildly enlarged without change. Musculoskeletal: Hemangiomas are again noted. Vertebral body heights and alignment are otherwise unremarkable. The bony pelvis is intact. IMPRESSION: 1. Anastomotic leak with heterogeneous fluid layering throughout the mesentery. 2. The fluid appears loculated adjacent to the peritoneal on the left and creates mass effect on the liver. This suggests infection. 3. At least 3 small locules of fluid are seen adjacent to small bowel near the resection site. Abscess was noted adjacent to the resected tumor. These are highly concerning for recurrent small abscess. 4. A small amount of gas is noted adjacent to the anastomosis, also suggesting leak. 5. Bilateral pleural effusions and associated atelectasis. 6. Minimal atherosclerotic changes. These results were called by telephone at the time of interpretation on 07/17/2016 at 7:36 pm to Dr. Kieth Brightly, who verbally acknowledged these results. Electronically Signed   By: San Morelle M.D.   On: 07/17/2016 19:45    Anti-infectives: Anti-infectives    Start     Dose/Rate Route Frequency Ordered Stop   07/18/16 1200  piperacillin-tazobactam (ZOSYN) IVPB 4.5 g  Status:  Discontinued     4.5 g 200 mL/hr over 30 Minutes Intravenous Every 6 hours 07/18/16 0729 07/18/16 0730   07/18/16 1000  levofloxacin (  LEVAQUIN) IVPB 750 mg  Status:  Discontinued     750 mg 100 mL/hr over 90 Minutes Intravenous Every 24 hours 07/18/16 0729 07/18/16 0852   07/18/16 0830  piperacillin-tazobactam (ZOSYN) IVPB 3.375 g     3.375 g 12.5 mL/hr over 240 Minutes Intravenous Every 8 hours 07/18/16 0732     07/18/16 0745  piperacillin-tazobactam (ZOSYN) IVPB 3.375 g  Status:  Discontinued     3.375 g 12.5 mL/hr over 240 Minutes Intravenous Every 8 hours 07/18/16 0730  07/18/16 0732   07/18/16 0030  piperacillin-tazobactam (ZOSYN) IVPB 3.375 g  Status:  Discontinued     3.375 g 12.5 mL/hr over 240 Minutes Intravenous Every 8 hours 07/18/16 0023 07/18/16 0729   07/17/16 1600  piperacillin-tazobactam (ZOSYN) IVPB 3.375 g  Status:  Discontinued     3.375 g 12.5 mL/hr over 240 Minutes Intravenous Every 8 hours 07/17/16 1515 07/18/16 0023   07/11/16 1000  piperacillin-tazobactam (ZOSYN) IVPB 3.375 g     3.375 g 12.5 mL/hr over 240 Minutes Intravenous Every 8 hours 07/11/16 0705 07/16/16 0528   07/11/16 0030  piperacillin-tazobactam (ZOSYN) IVPB 3.375 g     3.375 g 12.5 mL/hr over 240 Minutes Intravenous  Once 07/11/16 0028 07/11/16 0244       Assessment/Plan Small bowel perforation in the setting of metastatic pancreatic adenocarcinoma S/P EXPLORATORY LAPAROTOMY, SEGMENTAL SMALL BOWEL RESECTION, 07/11/16 Dr. Autumn Messing III - POD#8 - Surgical path - small bowel w/ full thickness adenocarcinoma, margins negative for dysplasia/tumor, all 8 nodes negative. - WBC elevated to 25.1, afebrile  S/p reopening of recent laparotomy, evacuation of hematoma 12/20 Dr. Kieth Brightly - POD 2 - no leak found intraoperatively - drain (1) 65cc serosanguinous, drain (2) 125cc serosanguinous  Possible HCAP Pneumonia - continue zosyn. Spoke with pharmacy, do not need cross coverage for HCAP at [facility], keep on just zosyn for now, consider adding vanc for MRSA coverage if not improving.  Pancreatic cancer- last dose chemotherapy 07/02/16 Diabetes Mellitus - Moderate SSI Hypothyroidism - IV levothyroxine 87.34mcg Sun/M/W/F and 57mcg T/Th/Sat COPD GERD - IV protonix 40mg   FEN: IVF, NG/NPO ID: Zosyn 12/14>>12/19, zosyn 12/20>> VTE: heparin/ SCD's  Plan: WBC elevated to 25.1, afebrile. Discussed with MD and will continue zosyn and also add vancomycin for broader coverage for HCAP. D/c foley. Ileus persists. Continue NPO/NGT. Start PICC/TPN. Encourage OOB and IS use more  frequently today. Abdominal wound dressing changes BID with wet to dry dressings.   LOS: 8 days    Jerrye Beavers , [facility] Surgery 07/19/2016, 7:59 AM Pager: [phone] Consults: [phone] Mon-Fri 7:00 am-4:30 pm Sat-Sun 7:00 am-11:30 am

## 2016-07-19 NOTE — Progress Notes (Signed)
Occupational Therapy Treatment Patient Details Name: JAKYREN MCCLENDON MRN: [id number] DOB: 1940/07/31 Today's Date: 07/19/2016    History of present illness The patient is a 75 year old white male who has a history of metastatic pancreatic cancer for the last 3 years, adm with severe abd pain;  s/p EXPLORATORY LAPAROTOMY  with segmental small bowel resection on 07/11/16;  12/20 EXPLORATORY LAPAROTOMY, EVACUATION OF HEMATOMA (N/A)   OT comments  Pt agreed to get up to chair!    Follow Up Recommendations  Home health OT;Supervision/Assistance - 24 hour    Equipment Recommendations  3 in 1 bedside commode       Precautions / Restrictions Precautions Precautions: Fall Precaution Comments: abd incision/wound, drains, NG tube       Mobility Bed Mobility Overal bed mobility: Needs Assistance Bed Mobility: Sit to Supine       Sit to supine: Max assist   General bed mobility comments: MAx A- pt iniitally with posterior lean but was able to self correct  Transfers Overall transfer level: Needs assistance Equipment used: Rolling walker (2 wheeled) Transfers: Sit to/from Stand           General transfer comment: cues for hand placement and use of LEs to power up/contorl descent        ADL Overall ADL's : Needs assistance/impaired                         Toilet Transfer: Minimal assistance;RW;Stand-pivot;Cueing for sequencing;Cueing for safety;+2 for safety/equipment Toilet Transfer Details (indicate cue type and reason): bed to chair           General ADL Comments: pts bed wet, pts wife upset.  OT encouraged pt to get OOB and pt agreed!  CNA Aed with drains. Pt needed increased time and encouragement.  Upon getting to chair IV team arrived                Cognition   Behavior During Therapy: [facility] for tasks assessed/performed Overall Cognitive Status: Within Functional Limits for tasks assessed                                     Pertinent Vitals/ Pain       Pain Assessment: 0-10 Faces Pain Scale: Hurts little more Pain Location: ABD recent surgery Pain Descriptors / Indicators: Discomfort;Grimacing;Operative site guarding Pain Intervention(s): Monitored during session;Limited activity within patient's tolerance;Repositioned         Frequency  Min 2X/week        Progress Toward Goals  OT Goals(current goals can now be found in the care plan section)  Progress towards OT goals: Progressing toward goals     Plan Discharge plan remains appropriate       End of Session Equipment Utilized During Treatment: Rolling walker   Activity Tolerance Patient tolerated treatment well   Patient Left in chair;with call bell/phone within reach;with family/visitor present   Nurse Communication Mobility status        Time: 1002-1019 OT Time Calculation (min): 17 min  Charges: OT General Charges $OT Visit: 1 Procedure OT Treatments $Self Care/Home Management : 8-22 mins  Webster, Aamani Moose D 07/19/2016, 11:02 AM

## 2016-07-20 LAB — DIFFERENTIAL
BASOS ABS: 0.1 10*3/uL (ref 0.0–0.1)
Basophils Relative: 0 %
EOS ABS: 0.2 10*3/uL (ref 0.0–0.7)
EOS PCT: 1 %
Lymphocytes Relative: 12 %
Lymphs Abs: 2.4 10*3/uL (ref 0.7–4.0)
Monocytes Absolute: 3 10*3/uL — ABNORMAL HIGH (ref 0.1–1.0)
Monocytes Relative: 16 %
NEUTROS PCT: 71 %
Neutro Abs: 13.6 10*3/uL — ABNORMAL HIGH (ref 1.7–7.7)

## 2016-07-20 LAB — COMPREHENSIVE METABOLIC PANEL
ALT: 9 U/L — AB (ref 17–63)
ANION GAP: 6 (ref 5–15)
AST: 15 U/L (ref 15–41)
Albumin: 1.8 g/dL — ABNORMAL LOW (ref 3.5–5.0)
Alkaline Phosphatase: 77 U/L (ref 38–126)
BUN: 19 mg/dL (ref 6–20)
CHLORIDE: 102 mmol/L (ref 101–111)
CO2: 27 mmol/L (ref 22–32)
Calcium: 7.3 mg/dL — ABNORMAL LOW (ref 8.9–10.3)
Creatinine, Ser: 0.82 mg/dL (ref 0.61–1.24)
GFR calc non Af Amer: >60 mL/min (ref >60)
Glucose, Bld: 111 mg/dL — ABNORMAL HIGH (ref 65–99)
Potassium: 3.8 mmol/L (ref 3.5–5.1)
SODIUM: 135 mmol/L (ref 135–145)
Total Bilirubin: 0.9 mg/dL (ref 0.3–1.2)
Total Protein: 5 g/dL — ABNORMAL LOW (ref 6.5–8.1)

## 2016-07-20 LAB — GLUCOSE, CAPILLARY
GLUCOSE-CAPILLARY: 107 mg/dL — AB (ref 65–99)
GLUCOSE-CAPILLARY: 124 mg/dL — AB (ref 65–99)
GLUCOSE-CAPILLARY: 127 mg/dL — AB (ref 65–99)
GLUCOSE-CAPILLARY: 129 mg/dL — AB (ref 65–99)
Glucose-Capillary: 106 mg/dL — ABNORMAL HIGH (ref 65–99)
Glucose-Capillary: 114 mg/dL — ABNORMAL HIGH (ref 65–99)

## 2016-07-20 LAB — MAGNESIUM: MAGNESIUM: 2 mg/dL (ref 1.7–2.4)

## 2016-07-20 LAB — TRIGLYCERIDES: TRIGLYCERIDES: 154 mg/dL — AB (ref <150)

## 2016-07-20 LAB — CBC
HCT: 26.6 % — ABNORMAL LOW (ref 39.0–52.0)
Hemoglobin: 9.3 g/dL — ABNORMAL LOW (ref 13.0–17.0)
MCH: 33 pg (ref 26.0–34.0)
MCHC: 35 g/dL (ref 30.0–36.0)
MCV: 94.3 fL (ref 78.0–100.0)
PLATELETS: 577 10*3/uL — AB (ref 150–400)
RBC: 2.82 MIL/uL — AB (ref 4.22–5.81)
RDW: 17 % — ABNORMAL HIGH (ref 11.5–15.5)
WBC: 19.2 10*3/uL — ABNORMAL HIGH (ref 4.0–10.5)

## 2016-07-20 LAB — PHOSPHORUS: PHOSPHORUS: 2.8 mg/dL (ref 2.5–4.6)

## 2016-07-20 LAB — PREALBUMIN: Prealbumin: <5 mg/dL — ABNORMAL LOW (ref 18–38)

## 2016-07-20 MED ORDER — KCL IN DEXTROSE-NACL 20-5-0.45 MEQ/L-%-% IV SOLN
INTRAVENOUS | Status: DC
  Administered 2016-07-21 – 2016-07-25 (×6): via INTRAVENOUS
  Filled 2016-07-20 (×8): qty 1000

## 2016-07-20 NOTE — Progress Notes (Addendum)
[place] [facility] Office:  [phone] General Surgery Progress Note   LOS: 9 days  POD -  3 Days Post-Op  Assessment/Plan: 1A. EXPLORATORY LAPAROTOMY with segmental small bowel resection - 07/11/2016 - Marlou Starks  B. EXPLORATORY LAPAROTOMY, EVACUATION OF HEMATOMA - 07/17/2016 - [name]  WBC - 19,200 - 07/20/2016  Seems to be getting slowly better.  To give ice.  Needs to ambulate more.  2.  Possible HCAP Pneumonia - zosyn/Vanc  3.  Pancreatic cancer- last dose chemotherapy 07/02/16  Dr. Tressie Stalker Jule Ser 4.  Diabetes Mellitus - Moderate SSI 5.  Hypothyroidism -   IV levothyroxine 87.64mcg Sun/M/W/F and 50mcg T/Th/Sat 6.  COPD 7.  Poor mobility 8.  Anemia - Hgb 9.3 - 07/20/2016 9.  DVT prophylaxis - SQ heparin 10.  Nutrition - if we can feed in a few days, will hold off on TPN.  Otherwise, will need nutrition support.  Prealbumin pending.   Principal Problem:   Small bowel perforation s/p SB resection 07/11/2016 Active Problems:   Stage IV adenocarcinoma of pancreas (HCC)   COPD (chronic obstructive pulmonary disease) (HCC)   SBO (small bowel obstruction)   Diabetes mellitus (HCC)   Hypothyroid   Subjective:  Passing flatus.  Wife in room.  Needs to walk more.  Objective:   Vitals:   07/19/16 2119 07/20/16 0534  BP: 109/62 (!) 99/56  Pulse: 77 69  Resp: 18 18  Temp: 97.6 F (36.4 C) 98.4 F (36.9 C)     Intake/Output from previous day:  12/22 0701 - 12/23 0700 In: 1620 [P.O.:420; I.V.:1100; IV Piggyback:100] Out: [id number] [Urine:1250; Emesis/NG output:280; Drains:79]  Intake/Output this shift:  Total I/O In: -  Out: 20 [Drains:20]   Physical Exam:   General: Older WM who is alert and oriented.    HEENT: Normal. Pupils equal. .   Lungs: Clear.  IS - 1,400 cc   Abdomen: Mild distention.  Few BS.   Wound: Clean. Two drains in right abdomen - 39 and 40 cc recorded yesterday   Lab Results:    Recent Labs  07/19/16 1013 07/20/16 0334  WBC  25.1* 19.2*  HGB 10.8* 9.3*  HCT 30.9* 26.6*  PLT 653* 577*    BMET   Recent Labs  07/19/16 1013 07/20/16 0334  NA 135 135  K 4.1 3.8  CL 100* 102  CO2 26 27  GLUCOSE 143* 111*  BUN 18 19  CREATININE 0.95 0.82  CALCIUM 7.4* 7.3*    PT/INR  No results for input(s): LABPROT, INR in the last 72 hours.  ABG  No results for input(s): PHART, HCO3 in the last 72 hours.  Invalid input(s): PCO2, PO2   Studies/Results:  No results found.   Anti-infectives:   Anti-infectives    Start     Dose/Rate Route Frequency Ordered Stop   07/20/16 1000  vancomycin (VANCOCIN) 1,250 mg in sodium chloride 0.9 % 250 mL IVPB     1,250 mg 166.7 mL/hr over 90 Minutes Intravenous Every 12 hours 07/19/16 1741     07/19/16 1800  vancomycin (VANCOCIN) 1,500 mg in sodium chloride 0.9 % 500 mL IVPB     1,500 mg 250 mL/hr over 120 Minutes Intravenous  Once 07/19/16 1741 07/20/16 0316   07/18/16 1200  piperacillin-tazobactam (ZOSYN) IVPB 4.5 g  Status:  Discontinued     4.5 g 200 mL/hr over 30 Minutes Intravenous Every 6 hours 07/18/16 0729 07/18/16 0730   07/18/16 1000  levofloxacin (LEVAQUIN) IVPB 750 mg  Status:  Discontinued     750 mg 100 mL/hr over 90 Minutes Intravenous Every 24 hours 07/18/16 0729 07/18/16 0852   07/18/16 0830  piperacillin-tazobactam (ZOSYN) IVPB 3.375 g     3.375 g 12.5 mL/hr over 240 Minutes Intravenous Every 8 hours 07/18/16 0732     07/18/16 0745  piperacillin-tazobactam (ZOSYN) IVPB 3.375 g  Status:  Discontinued     3.375 g 12.5 mL/hr over 240 Minutes Intravenous Every 8 hours 07/18/16 0730 07/18/16 0732   07/18/16 0030  piperacillin-tazobactam (ZOSYN) IVPB 3.375 g  Status:  Discontinued     3.375 g 12.5 mL/hr over 240 Minutes Intravenous Every 8 hours 07/18/16 0023 07/18/16 0729   07/17/16 1600  piperacillin-tazobactam (ZOSYN) IVPB 3.375 g  Status:  Discontinued     3.375 g 12.5 mL/hr over 240 Minutes Intravenous Every 8 hours 07/17/16 1515 07/18/16 0023    07/11/16 1000  piperacillin-tazobactam (ZOSYN) IVPB 3.375 g     3.375 g 12.5 mL/hr over 240 Minutes Intravenous Every 8 hours 07/11/16 0705 07/16/16 0528   07/11/16 0030  piperacillin-tazobactam (ZOSYN) IVPB 3.375 g     3.375 g 12.5 mL/hr over 240 Minutes Intravenous  Once 07/11/16 0028 07/11/16 0244      Alphonsa Overall, MD, FACS Pager: [place] [facility] Office: [phone] 07/20/2016

## 2016-07-20 NOTE — Progress Notes (Signed)
Attempted to obtain consent for PICC placement per order.  Pt and wife (at bedside) request to wait until tomorrow at least to see if PICC still needed for IV Nutrition.  Currently no TNA ordered and sufficient IV access available.  Pt and wife pleasant and cooperative.  Anderson Malta RN also in room during discussion to witness conversation and clarify questions.  This RN will check again tomorrow to see if pt agreeable/PICC still necessary.

## 2016-07-21 LAB — CBC WITH DIFFERENTIAL/PLATELET
BASOS ABS: 0 10*3/uL (ref 0.0–0.1)
Basophils Relative: 0 %
Eosinophils Absolute: 0.2 10*3/uL (ref 0.0–0.7)
Eosinophils Relative: 2 %
HEMATOCRIT: 26 % — AB (ref 39.0–52.0)
Hemoglobin: 8.9 g/dL — ABNORMAL LOW (ref 13.0–17.0)
LYMPHS PCT: 14 %
Lymphs Abs: 2 10*3/uL (ref 0.7–4.0)
MCH: 32.5 pg (ref 26.0–34.0)
MCHC: 34.2 g/dL (ref 30.0–36.0)
MCV: 94.9 fL (ref 78.0–100.0)
Monocytes Absolute: 2.1 10*3/uL — ABNORMAL HIGH (ref 0.1–1.0)
Monocytes Relative: 15 %
NEUTROS ABS: 9.8 10*3/uL — AB (ref 1.7–7.7)
Neutrophils Relative %: 69 %
Platelets: 539 10*3/uL — ABNORMAL HIGH (ref 150–400)
RBC: 2.74 MIL/uL — AB (ref 4.22–5.81)
RDW: 17.1 % — ABNORMAL HIGH (ref 11.5–15.5)
WBC: 14.1 10*3/uL — AB (ref 4.0–10.5)

## 2016-07-21 LAB — BASIC METABOLIC PANEL
ANION GAP: 5 (ref 5–15)
BUN: 14 mg/dL (ref 6–20)
CHLORIDE: 101 mmol/L (ref 101–111)
CO2: 27 mmol/L (ref 22–32)
Calcium: 7.1 mg/dL — ABNORMAL LOW (ref 8.9–10.3)
Creatinine, Ser: 0.88 mg/dL (ref 0.61–1.24)
GFR calc Af Amer: >60 mL/min (ref >60)
GFR calc non Af Amer: >60 mL/min (ref >60)
GLUCOSE: 141 mg/dL — AB (ref 65–99)
POTASSIUM: 3.7 mmol/L (ref 3.5–5.1)
Sodium: 133 mmol/L — ABNORMAL LOW (ref 135–145)

## 2016-07-21 LAB — GLUCOSE, CAPILLARY
GLUCOSE-CAPILLARY: 175 mg/dL — AB (ref 65–99)
Glucose-Capillary: 129 mg/dL — ABNORMAL HIGH (ref 65–99)
Glucose-Capillary: 142 mg/dL — ABNORMAL HIGH (ref 65–99)
Glucose-Capillary: 134 mg/dL — ABNORMAL HIGH (ref 65–99)
Glucose-Capillary: 181 mg/dL — ABNORMAL HIGH (ref 65–99)

## 2016-07-21 NOTE — Progress Notes (Signed)
[facility] Office:  [phone] General Surgery Progress Note   LOS: 10 days  POD -  4 Days Post-Op  Assessment/Plan: 1A. EXPLORATORY LAPAROTOMY with segmental small bowel resection - 07/11/2016 - Albert Richards 1B. EXPLORATORY LAPAROTOMY, EVACUATION OF HEMATOMA - 07/17/2016 - Albert Richards  WBC - 14,100 - 07/20/2016  Slow progress.  Removed NGT.  Will allow sips from the floor.  Still needs to move more.  2.  Possible HCAP Pneumonia - zosyn 12/14 >>>  Vanc 12/23 >>>  3.  Pancreatic cancer- last dose chemotherapy 07/02/16  Dr. Tressie Stalker Jule Ser 4.  Diabetes Mellitus - Moderate SSI 5.  Hypothyroidism -   IV levothyroxine 87.41mcg Sun/M/W/F and 73mcg T/Th/Sat 6.  COPD 7.  Poor mobility 8.  Anemia - Hgb 8.9 - 07/21/2016 9.  DVT prophylaxis - SQ heparin 10.  Nutrition -   Prealbumin - <5 - 07/20/2016   Principal Problem:   Small bowel perforation s/p SB resection 07/11/2016 Active Problems:   Stage IV adenocarcinoma of pancreas (HCC)   COPD (chronic obstructive pulmonary disease) (HCC)   SBO (small bowel obstruction)   Diabetes mellitus (HCC)   Hypothyroid  Subjective:  Passing flatus, but no BM.  Abdomen doing okay.  Not really hungry.  Wife in room.  Objective:   Vitals:   07/20/16 2141 07/21/16 0535  BP: 113/69 114/64  Pulse: 66 63  Resp: 16 16  Temp: 97.6 F (36.4 C) 97.8 F (36.6 C)     Intake/Output from previous day:  12/23 0701 - 12/24 0700 In: 1768.8 [P.O.:90; I.V.:1323.8; IV Piggyback:355] Out: [id number] [Urine:500; Emesis/NG output:700; Drains:55]  Intake/Output this shift:  No intake/output data recorded.   Physical Exam:   General: Older WM who is alert and oriented.    HEENT: Normal. Pupils equal. .   Lungs: Clear.    Abdomen: Mild distention.  Few BS.   Wound: Clean. Two drains in right abdomen - 1/2 - 25/30 recorded yesterday   Lab Results:     Recent Labs  07/20/16 0334 07/21/16 0648  WBC 19.2* 14.1*  HGB 9.3* 8.9*  HCT 26.6*  26.0*  PLT 577* 539*    BMET    Recent Labs  07/20/16 0334 07/21/16 0648  NA 135 133*  K 3.8 3.7  CL 102 101  CO2 27 27  GLUCOSE 111* 141*  BUN 19 14  CREATININE 0.82 0.88  CALCIUM 7.3* 7.1*    PT/INR  No results for input(s): LABPROT, INR in the last 72 hours.  ABG  No results for input(s): PHART, HCO3 in the last 72 hours.  Invalid input(s): PCO2, PO2   Studies/Results:  No results found.   Anti-infectives:   Anti-infectives    Start     Dose/Rate Route Frequency Ordered Stop   07/20/16 1000  vancomycin (VANCOCIN) 1,250 mg in sodium chloride 0.9 % 250 mL IVPB     1,250 mg 166.7 mL/hr over 90 Minutes Intravenous Every 12 hours 07/19/16 1741     07/19/16 1800  vancomycin (VANCOCIN) 1,500 mg in sodium chloride 0.9 % 500 mL IVPB     1,500 mg 250 mL/hr over 120 Minutes Intravenous  Once 07/19/16 1741 07/20/16 0316   07/18/16 1200  piperacillin-tazobactam (ZOSYN) IVPB 4.5 g  Status:  Discontinued     4.5 g 200 mL/hr over 30 Minutes Intravenous Every 6 hours 07/18/16 0729 07/18/16 0730   07/18/16 1000  levofloxacin (LEVAQUIN) IVPB 750 mg  Status:  Discontinued     750 mg 100 mL/hr  over 90 Minutes Intravenous Every 24 hours 07/18/16 0729 07/18/16 0852   07/18/16 0830  piperacillin-tazobactam (ZOSYN) IVPB 3.375 g     3.375 g 12.5 mL/hr over 240 Minutes Intravenous Every 8 hours 07/18/16 0732     07/18/16 0745  piperacillin-tazobactam (ZOSYN) IVPB 3.375 g  Status:  Discontinued     3.375 g 12.5 mL/hr over 240 Minutes Intravenous Every 8 hours 07/18/16 0730 07/18/16 0732   07/18/16 0030  piperacillin-tazobactam (ZOSYN) IVPB 3.375 g  Status:  Discontinued     3.375 g 12.5 mL/hr over 240 Minutes Intravenous Every 8 hours 07/18/16 0023 07/18/16 0729   07/17/16 1600  piperacillin-tazobactam (ZOSYN) IVPB 3.375 g  Status:  Discontinued     3.375 g 12.5 mL/hr over 240 Minutes Intravenous Every 8 hours 07/17/16 1515 07/18/16 0023   07/11/16 1000  piperacillin-tazobactam  (ZOSYN) IVPB 3.375 g     3.375 g 12.5 mL/hr over 240 Minutes Intravenous Every 8 hours 07/11/16 0705 07/16/16 0528   07/11/16 0030  piperacillin-tazobactam (ZOSYN) IVPB 3.375 g     3.375 g 12.5 mL/hr over 240 Minutes Intravenous  Once 07/11/16 0028 07/11/16 0244      Alphonsa Overall, MD, FACS Pager: [place] [facility] Office: [phone] 07/21/2016

## 2016-07-22 LAB — CBC WITH DIFFERENTIAL/PLATELET
BASOS ABS: 0 10*3/uL (ref 0.0–0.1)
Basophils Relative: 0 %
EOS PCT: 2 %
Eosinophils Absolute: 0.2 10*3/uL (ref 0.0–0.7)
HEMATOCRIT: 26 % — AB (ref 39.0–52.0)
HEMOGLOBIN: 9 g/dL — AB (ref 13.0–17.0)
LYMPHS PCT: 18 %
Lymphs Abs: 2.2 10*3/uL (ref 0.7–4.0)
MCH: 32.8 pg (ref 26.0–34.0)
MCHC: 34.6 g/dL (ref 30.0–36.0)
MCV: 94.9 fL (ref 78.0–100.0)
MONOS PCT: 16 %
Monocytes Absolute: 2 10*3/uL — ABNORMAL HIGH (ref 0.1–1.0)
Neutro Abs: 7.8 10*3/uL — ABNORMAL HIGH (ref 1.7–7.7)
Neutrophils Relative %: 64 %
Platelets: 561 10*3/uL — ABNORMAL HIGH (ref 150–400)
RBC: 2.74 MIL/uL — AB (ref 4.22–5.81)
RDW: 17 % — ABNORMAL HIGH (ref 11.5–15.5)
WBC: 12.2 10*3/uL — AB (ref 4.0–10.5)

## 2016-07-22 LAB — GLUCOSE, CAPILLARY
GLUCOSE-CAPILLARY: 132 mg/dL — AB (ref 65–99)
GLUCOSE-CAPILLARY: 142 mg/dL — AB (ref 65–99)
GLUCOSE-CAPILLARY: 162 mg/dL — AB (ref 65–99)
GLUCOSE-CAPILLARY: 170 mg/dL — AB (ref 65–99)
GLUCOSE-CAPILLARY: 128 mg/dL — AB (ref 65–99)
Glucose-Capillary: 122 mg/dL — ABNORMAL HIGH (ref 65–99)
Glucose-Capillary: 96 mg/dL (ref 65–99)

## 2016-07-22 LAB — BASIC METABOLIC PANEL
ANION GAP: 4 — AB (ref 5–15)
BUN: 8 mg/dL (ref 6–20)
CALCIUM: 7.3 mg/dL — AB (ref 8.9–10.3)
CHLORIDE: 101 mmol/L (ref 101–111)
CO2: 27 mmol/L (ref 22–32)
Creatinine, Ser: 0.83 mg/dL (ref 0.61–1.24)
GFR calc non Af Amer: >60 mL/min (ref >60)
Glucose, Bld: 150 mg/dL — ABNORMAL HIGH (ref 65–99)
POTASSIUM: 3.4 mmol/L — AB (ref 3.5–5.1)
Sodium: 132 mmol/L — ABNORMAL LOW (ref 135–145)

## 2016-07-22 NOTE — Progress Notes (Signed)
Pharmacy Antibiotic Note  [facility] is a 75 y.o. male with hx metastatic pancreatic cancer admitted on 07/10/2016 with severe abdominal pain. CT showed free air and worsening metastatic disease with small bowel perforation. Pt underwent s/p exploratory lap and segmental small bowel resection 12/14. Pharmacy has been consulted for zosyn dosing.    Today, 07/22/2016  Surgery restarted Zosyn 12/20 and added Vancomycin 12/23 for HCAP  Note MRSA PCR negative, consider d/c Vancomycin  Day 12 Zosyn, Day 3 Vancomycin  Plan:  Vancomycin 1500 mg IV x1, then 1250 mg IV q12 hr; goal trough 15-20 mcg/mL  Vancomycin trough 12/26 at 9am  Continue Zosyn 3.375 g IV every 8 hrs by 4-hr infusion.  Height: 5\' 10"  (177.8 cm) Weight: 215 lb (97.5 kg) IBW/kg (Calculated) : 73  Temp (24hrs), Avg:98.6 F (37 C), Min:98.3 F (36.8 C), Max:98.8 F (37.1 C)   Recent Labs Lab 07/17/16 0412 07/19/16 1013 07/20/16 0334 07/21/16 0648 07/22/16 0611  WBC 16.9* 25.1* 19.2* 14.1* 12.2*  CREATININE 0.82 0.95 0.82 0.88 0.83    Estimated Creatinine Clearance: 88.7 mL/min (by C-G formula based on SCr of 0.83 mg/dL).    No Known Allergies  Antimicrobials this admission: 12/14 Zosyn >> 12/19; resumed 12/20 >> 12/23 Vancomycin >>   Dose adjustments this admission:   Microbiology results: 12/14: MRSA PCR: neg  Thank you for allowing pharmacy to be a part of this patient's care.  Minda Ditto PharmD Pager [phone] 07/22/2016, 1:37 PM

## 2016-07-22 NOTE — Progress Notes (Signed)
5 Days Post-Op  Subjective: No complaints. Feels swollen  Objective: Vital signs in last 24 hours: Temp:  [98.3 F (36.8 C)-98.8 F (37.1 C)] 98.8 F (37.1 C) (12/25 0505) Pulse Rate:  [62-69] 62 (12/25 0505) Resp:  [15-18] 15 (12/25 0505) BP: (104-134)/(56-65) 104/59 (12/25 0505) SpO2:  [92 %-94 %] 92 % (12/25 0505) Last BM Date: 07/21/16 (small)  Intake/Output from previous day: 12/24 0701 - 12/25 0700 In: 2634.9 [P.O.:450; I.V.:1477.4; IV Piggyback:707.5] Out: 1155 [Urine:1000; Drains:155] Intake/Output this shift: Total I/O In: 0  Out: 140 [Drains:140]  Resp: clear to auscultation bilaterally Cardio: regular rate and rhythm GI: soft, distended. appropriately tender. had a large bm today  Lab Results:   Recent Labs  07/21/16 0648 07/22/16 0611  WBC 14.1* 12.2*  HGB 8.9* 9.0*  HCT 26.0* 26.0*  PLT 539* 561*   BMET  Recent Labs  07/21/16 0648 07/22/16 0611  NA 133* 132*  K 3.7 3.4*  CL 101 101  CO2 27 27  GLUCOSE 141* 150*  BUN 14 8  CREATININE 0.88 0.83  CALCIUM 7.1* 7.3*   PT/INR No results for input(s): LABPROT, INR in the last 72 hours. ABG No results for input(s): PHART, HCO3 in the last 72 hours.  Invalid input(s): PCO2, PO2  Studies/Results: No results found.  Anti-infectives: Anti-infectives    Start     Dose/Rate Route Frequency Ordered Stop   07/20/16 1000  vancomycin (VANCOCIN) 1,250 mg in sodium chloride 0.9 % 250 mL IVPB     1,250 mg 166.7 mL/hr over 90 Minutes Intravenous Every 12 hours 07/19/16 1741     07/19/16 1800  vancomycin (VANCOCIN) 1,500 mg in sodium chloride 0.9 % 500 mL IVPB     1,500 mg 250 mL/hr over 120 Minutes Intravenous  Once 07/19/16 1741 07/20/16 0316   07/18/16 1200  piperacillin-tazobactam (ZOSYN) IVPB 4.5 g  Status:  Discontinued     4.5 g 200 mL/hr over 30 Minutes Intravenous Every 6 hours 07/18/16 0729 07/18/16 0730   07/18/16 1000  levofloxacin (LEVAQUIN) IVPB 750 mg  Status:  Discontinued     750  mg 100 mL/hr over 90 Minutes Intravenous Every 24 hours 07/18/16 0729 07/18/16 0852   07/18/16 0830  piperacillin-tazobactam (ZOSYN) IVPB 3.375 g     3.375 g 12.5 mL/hr over 240 Minutes Intravenous Every 8 hours 07/18/16 0732     07/18/16 0745  piperacillin-tazobactam (ZOSYN) IVPB 3.375 g  Status:  Discontinued     3.375 g 12.5 mL/hr over 240 Minutes Intravenous Every 8 hours 07/18/16 0730 07/18/16 0732   07/18/16 0030  piperacillin-tazobactam (ZOSYN) IVPB 3.375 g  Status:  Discontinued     3.375 g 12.5 mL/hr over 240 Minutes Intravenous Every 8 hours 07/18/16 0023 07/18/16 0729   07/17/16 1600  piperacillin-tazobactam (ZOSYN) IVPB 3.375 g  Status:  Discontinued     3.375 g 12.5 mL/hr over 240 Minutes Intravenous Every 8 hours 07/17/16 1515 07/18/16 0023   07/11/16 1000  piperacillin-tazobactam (ZOSYN) IVPB 3.375 g     3.375 g 12.5 mL/hr over 240 Minutes Intravenous Every 8 hours 07/11/16 0705 07/16/16 0528   07/11/16 0030  piperacillin-tazobactam (ZOSYN) IVPB 3.375 g     3.375 g 12.5 mL/hr over 240 Minutes Intravenous  Once 07/11/16 0028 07/11/16 0244      Assessment/Plan: s/p Procedure(s): EXPLORATORY LAPAROTOMY, EVACUATION OF HEMATOMA (N/A) Advance diet. Start clears and monitor distension Ambulate Continue vanc/zosyn for hospital aquired pneumonia  LOS: 11 days    [name] III,PAUL S  07/22/2016 

## 2016-07-23 LAB — GLUCOSE, CAPILLARY
GLUCOSE-CAPILLARY: 180 mg/dL — AB (ref 65–99)
GLUCOSE-CAPILLARY: 154 mg/dL — AB (ref 65–99)
Glucose-Capillary: 132 mg/dL — ABNORMAL HIGH (ref 65–99)
Glucose-Capillary: 137 mg/dL — ABNORMAL HIGH (ref 65–99)
Glucose-Capillary: 122 mg/dL — ABNORMAL HIGH (ref 65–99)

## 2016-07-23 LAB — VANCOMYCIN, TROUGH: Vancomycin Tr: 16 ug/mL (ref 15–20)

## 2016-07-23 MED ORDER — ENSURE ENLIVE PO LIQD
237.0000 mL | Freq: Three times a day (TID) | ORAL | Status: DC
  Administered 2016-07-24 – 2016-07-26 (×6): 237 mL via ORAL

## 2016-07-23 NOTE — Progress Notes (Signed)
Occupational Therapy Treatment Patient Details Name: Albert Richards MRN: [id number] DOB: 1941/04/04 Today's Date: 07/23/2016    History of present illness The patient is a 75 year old white male who has a history of metastatic pancreatic cancer for the last 3 years, adm with severe abd pain;  s/p EXPLORATORY LAPAROTOMY  with segmental small bowel resection on 07/11/16;  12/20 EXPLORATORY LAPAROTOMY, EVACUATION OF HEMATOMA (N/A)   OT comments  Pt doing better but does need much encouragement  Follow Up Recommendations  Home health OT;Supervision/Assistance - 24 hour    Equipment Recommendations  3 in 1 bedside commode    Recommendations for Other Services      Precautions / Restrictions Precautions Precautions: Fall Precaution Comments: abd incision/wound, drains       Mobility Bed Mobility Overal bed mobility: Needs Assistance Bed Mobility: Supine to Sit Rolling: Min assist Sidelying to sit: Min assist          Transfers Overall transfer level: Needs assistance Equipment used: Rolling walker (2 wheeled) Transfers: Sit to/from [employer] Sit to Stand: Min guard Stand pivot transfers: Min guard       General transfer comment: VC for hand placement        ADL Overall ADL's : Needs assistance/impaired     Grooming: Standing;Min guard;Oral care;Wash/dry face               Lower Body Dressing: Moderate assistance;Cueing for compensatory techniques;Cueing for safety;Cueing for sequencing;With adaptive equipment;Sit to/from stand Lower Body Dressing Details (indicate cue type and reason): educated on AE, but pt stated wife would perform if he couldnt do forself Toilet Transfer: Minimal assistance;Regular Toilet;RW;Ambulation;Comfort height toilet   Toileting- Clothing Manipulation and Hygiene: Sit to/from stand;Total assistance;Cueing for safety;Cueing for sequencing;Cueing for compensatory techniques         General ADL Comments: pt  walked to bathroom and had BM on the way.  Pt total A for hygiene.  Educated wife to have BSC beside the bed for pt and perfrom BSC transfer rather than going to bath room in times of urgency                Cognition   Behavior During Therapy: [facility] for tasks assessed/performed Overall Cognitive Status: Within Functional Limits for tasks assessed                               General Comments  reiterated to wife to use BSC and call for A in times of urgency    Pertinent Vitals/ Pain       Pain Score: 3  Pain Location: stomach area Pain Descriptors / Indicators: Sore Pain Intervention(s): Monitored during session;Repositioned         Frequency  Min 2X/week        Progress Toward Goals  OT Goals(current goals can now be found in the care plan section)  Progress towards OT goals: Progressing toward goals  Acute Rehab OT Goals Patient Stated Goal: home OT Goal Formulation: With patient  Plan Discharge plan remains appropriate    Co-evaluation                 End of Session Equipment Utilized During Treatment: Rolling walker   Activity Tolerance Patient tolerated treatment well   Patient Left in chair;with call bell/phone within reach;with family/visitor present   Nurse Communication Mobility status        Time: 7829-5621 OT Time Calculation (min): 60 min  Charges: OT General Charges $OT Visit: 1 Procedure OT Treatments $Self Care/Home Management : 38-52 mins  Albert Richards, [place] 07/23/2016, 11:14 AM

## 2016-07-23 NOTE — Care Management Important Message (Signed)
Important Message  Patient Details  Name: CARVER GEORGIA MRN: [id number] Date of Birth: 08/21/1940   Medicare Important Message Given:  Yes    Kerin Salen 07/23/2016, 2:03 [place] Message  Patient Details  Name: TRESTON HORVAT MRN: [id number] Date of Birth: Aug 03, 1940   Medicare Important Message Given:  Yes    Kerin Salen 07/23/2016, 2:03 PM

## 2016-07-23 NOTE — Progress Notes (Signed)
Patient ID: Albert Richards, male   DOB: 1940/11/07, 75 y.o.   MRN: [id number]  [facility] Surgery Progress Note  6 Days Post-Op  Subjective: Had large BM yesterday and passing moderate amount of flatus this morning. Denies abdominal pain, bloating, or nausea or vomiting. Tolerating clear liquids. Continues to have small, mildly productive cough.  Objective: Vital signs in last 24 hours: Temp:  [97.4 F (36.3 C)-98 F (36.7 C)] 97.4 F (36.3 C) (12/26 0615) Pulse Rate:  [61-77] 61 (12/26 0615) Resp:  [16-17] 16 (12/26 0615) BP: (111-139)/(56-68) 111/68 (12/26 0615) SpO2:  [93 %-98 %] 94 % (12/26 0615) Last BM Date: 07/22/16  Intake/Output from previous day: 12/25 0701 - 12/26 0700 In: 2778.8 [P.O.:840; I.V.:1580.8; IV Piggyback:358] Out: 2425 [Urine:2175; Drains:250] Intake/Output this shift: No intake/output data recorded.  PE: Gen: Alert, NAD, cooperative Pulm: CTAB w/ decreased breath sounds in bilateral bases, no W/R/R, effort normal Abd: Soft, nontender, minimal distension, +BS, open midline incision healing well with good granulation tissue and no active drainage or surrounding erythema Ext: No erythema, edema, or tenderness  IS - 1250  Lab Results:   Recent Labs  07/21/16 0648 07/22/16 0611  WBC 14.1* 12.2*  HGB 8.9* 9.0*  HCT 26.0* 26.0*  PLT 539* 561*   BMET  Recent Labs  07/21/16 0648 07/22/16 0611  NA 133* 132*  K 3.7 3.4*  CL 101 101  CO2 27 27  GLUCOSE 141* 150*  BUN 14 8  CREATININE 0.88 0.83  CALCIUM 7.1* 7.3*   PT/INR No results for input(s): LABPROT, INR in the last 72 hours. CMP     Component Value Date/Time   NA 132 (L) 07/22/2016 0611   NA 140 04/08/2016 1112   K 3.4 (L) 07/22/2016 0611   K 4.3 04/08/2016 1112   CL 101 07/22/2016 0611   CO2 27 07/22/2016 0611   CO2 26 04/08/2016 1112   GLUCOSE 150 (H) 07/22/2016 0611   GLUCOSE 137 04/08/2016 1112   BUN 8 07/22/2016 0611   BUN 16.8 04/08/2016 1112   CREATININE  0.83 07/22/2016 0611   CREATININE 1.1 04/08/2016 1112   CALCIUM 7.3 (L) 07/22/2016 0611   CALCIUM 9.3 04/08/2016 1112   PROT 5.0 (L) 07/20/2016 0334   PROT 6.7 07/20/2013 1432   ALBUMIN 1.8 (L) 07/20/2016 0334   ALBUMIN 3.4 (L) 07/20/2013 1432   AST 15 07/20/2016 0334   AST 15 07/20/2013 1432   ALT 9 (L) 07/20/2016 0334   ALT 15 07/20/2013 1432   ALKPHOS 77 07/20/2016 0334   ALKPHOS 81 07/20/2013 1432   BILITOT 0.9 07/20/2016 0334   BILITOT 0.40 07/20/2013 1432   GFRNONAA >60 07/22/2016 0611   GFRAA >60 07/22/2016 0611   Lipase     Component Value Date/Time   LIPASE 19 07/10/2016 2206       Studies/Results: No results found.  Anti-infectives: Anti-infectives    Start     Dose/Rate Route Frequency Ordered Stop   07/20/16 1000  vancomycin (VANCOCIN) 1,250 mg in sodium chloride 0.9 % 250 mL IVPB     1,250 mg 166.7 mL/hr over 90 Minutes Intravenous Every 12 hours 07/19/16 1741     07/19/16 1800  vancomycin (VANCOCIN) 1,500 mg in sodium chloride 0.9 % 500 mL IVPB     1,500 mg 250 mL/hr over 120 Minutes Intravenous  Once 07/19/16 1741 07/20/16 0316   07/18/16 1200  piperacillin-tazobactam (ZOSYN) IVPB 4.5 g  Status:  Discontinued     4.5 g 200  mL/hr over 30 Minutes Intravenous Every 6 hours 07/18/16 0729 07/18/16 0730   07/18/16 1000  levofloxacin (LEVAQUIN) IVPB 750 mg  Status:  Discontinued     750 mg 100 mL/hr over 90 Minutes Intravenous Every 24 hours 07/18/16 0729 07/18/16 0852   07/18/16 0830  piperacillin-tazobactam (ZOSYN) IVPB 3.375 g     3.375 g 12.5 mL/hr over 240 Minutes Intravenous Every 8 hours 07/18/16 0732     07/18/16 0745  piperacillin-tazobactam (ZOSYN) IVPB 3.375 g  Status:  Discontinued     3.375 g 12.5 mL/hr over 240 Minutes Intravenous Every 8 hours 07/18/16 0730 07/18/16 0732   07/18/16 0030  piperacillin-tazobactam (ZOSYN) IVPB 3.375 g  Status:  Discontinued     3.375 g 12.5 mL/hr over 240 Minutes Intravenous Every 8 hours 07/18/16 0023  07/18/16 0729   07/17/16 1600  piperacillin-tazobactam (ZOSYN) IVPB 3.375 g  Status:  Discontinued     3.375 g 12.5 mL/hr over 240 Minutes Intravenous Every 8 hours 07/17/16 1515 07/18/16 0023   07/11/16 1000  piperacillin-tazobactam (ZOSYN) IVPB 3.375 g     3.375 g 12.5 mL/hr over 240 Minutes Intravenous Every 8 hours 07/11/16 0705 07/16/16 0528   07/11/16 0030  piperacillin-tazobactam (ZOSYN) IVPB 3.375 g     3.375 g 12.5 mL/hr over 240 Minutes Intravenous  Once 07/11/16 0028 07/11/16 0244       Assessment/Plan Small bowel perforation in the setting of metastatic pancreatic adenocarcinoma S/P EXPLORATORY LAPAROTOMY, SEGMENTAL SMALL BOWEL RESECTION, 07/11/16 Dr. Autumn Messing III - POD#12 - Surgical path - small bowel w/ full thickness adenocarcinoma, margins negative for dysplasia/tumor, all 8 nodes negative.  S/p reopening of recent laparotomy, evacuation of hematoma 12/20 Dr. Kieth Brightly - POD 6 - no leak found intraoperatively - drain (1) 25cc serosanguinous, drain (2) 125cc serosanguinous  Possible HCAP Pneumonia  - zosyn (day #13) and vanc (day #4)  Pancreatic cancer- last dose chemotherapy 07/02/16  Followed by Dr. London Pepper Diabetes Mellitus - Moderate SSI Hypothyroidism - IV levothyroxine 87.72mcg Sun/M/W/F and 66mcg T/Th/Sat COPD GERD - IV protonix 40mg   FEN: IVF, full liquids ID: Zosyn 12/14>>12/19, zosyn 12/20>>, vanc 12/23>> VTE: heparin/ SCD's  Plan: advance to full liquid diet. Continue to encourage more mobilization/ambulation and IS; will reconsult PT.   Continue vanc/zosyn (days 4 and 13 for aspiration pneumonia); check CXR in AM.   Check CBC/BMP in AM   LOS: 12 days    Jerrye Beavers , [facility] Surgery 07/23/2016, 8:00 AM Pager: [phone] Consults: [phone] Mon-Fri 7:00 am-4:30 pm Sat-Sun 7:00 am-11:30 am  Agree with above. Had another BM today. His daughter, Janace Hoard, is at the bedside. Will plan combined antibiotics -  Zosyn/Vanc for a total of 7 days Advance diet as tolerated. He is somewhat skittish about getting up to walk because of some accidents with his BM.  He has two drains - will start removing tomorrow.  Alphonsa Overall, MD, [facility] Surgery Pager: [phone] Office phone:  [phone]

## 2016-07-23 NOTE — Progress Notes (Signed)
Physical Therapy Treatment Patient Details Name: Albert Richards MRN: [id number] DOB: [birth year] Today's Date: 07/23/2016    History of Present Illness The patient is a 75 year old white male who has a history of metastatic pancreatic cancer for the last 3 years, adm with severe abd pain;  s/p EXPLORATORY LAPAROTOMY  with segmental small bowel resection on 07/11/16;  12/20 EXPLORATORY LAPAROTOMY, EVACUATION OF HEMATOMA (N/A)    PT Comments    Patient had another loose BM, limited in the room today.   Follow Up Recommendations  Home health PT     Equipment Recommendations  Rolling walker with 5" wheels    Recommendations for Other Services       Precautions / Restrictions Precautions Precautions: Fall Precaution Comments: abd incision/wound, drains, diarrhea    Mobility  Bed Mobility               General bed mobility comments: in bathroom  Transfers Overall transfer level: Needs assistance Equipment used: Rolling walker (2 wheeled) Transfers: Sit to/from Stand Sit to Stand: Min guard         General transfer comment: VC for hand placement  Ambulation/Gait Ambulation/Gait assistance: Min guard Ambulation Distance (Feet): 15 Feet Assistive device: Rolling walker (2 wheeled) Gait Pattern/deviations: Step-to pattern;Step-through pattern     General Gait Details: increased time, slow but steady gait   only tolerated walking to and from bathroom, having loose BM's   Stairs            Wheelchair Mobility    Modified Rankin (Stroke Patients Only)       Balance                                    Cognition Arousal/Alertness: Awake/alert                          Exercises      General Comments        Pertinent Vitals/Pain Faces Pain Scale: Hurts little more Pain Location: stomach area Pain Descriptors / Indicators: Sore Pain Intervention(s): Monitored during session    Home Living                       Prior Function            PT Goals (current goals can now be found in the care plan section) Progress towards PT goals: Progressing toward goals    Frequency    Min 3X/week      PT Plan Current plan remains appropriate    Co-evaluation             End of Session   Activity Tolerance: Patient tolerated treatment well Patient left: in chair;with call bell/phone within reach     Time: 1011-1028 PT Time Calculation (min) (ACUTE ONLY): 17 min  Charges:  $Gait Training: 8-22 mins                    G Codes:      [name] 07/23/2016, 4:01 PM

## 2016-07-23 NOTE — Progress Notes (Signed)
Initial Nutrition Assessment  DOCUMENTATION CODES:   Obesity unspecified  INTERVENTION:  Recommend weighing patient as last weight was 13 days ago on admission. Pending accurate weight, patient may meet criteria for acute malnutrition.   Continue diet advancement per Surgery.   Provide Ensure Enlive po TID, each supplement provides 350 kcal and 20 grams of protein. Per patient request mix in ice cream to make milkshake.  Reviewed full liquid diet with patient and encouraged intake of foods and beverages with adequate calories and protein.   NUTRITION DIAGNOSIS:   Increased nutrient needs related to catabolic illness, cancer and cancer related treatments as evidenced by estimated needs.  GOAL:   Patient will meet greater than or equal to 90% of their needs  MONITOR:   PO intake, Supplement acceptance, Diet advancement, Labs, Weight trends, I & O's  REASON FOR ASSESSMENT:   LOS    ASSESSMENT:   75 year old male who has a history of metastatic pancreatic cancer for the last 3 years who recently received another dose of chemotherapy. PTA he developed severe abdominal pain and some vomiting associated with it. Patient found to have small bowel perforation in the setting of metastatic pancreatic adenocarcinoma.     -Patient s/p exploratory laparotomy with segmental small bowel resection on 12/14.  -Patient s/p reopening of recent laparotomy and evacuation of hematoma on 12/20.  -Patient has been NPO/CLD since admission. He was advanced to FLD briefly from 12/18-12/20 and again this morning.   Spoke with patient at bedside. He reports his appetite is good now. He reports some diarrhea but denies N/V or abdominal pain. Today so far he has tolerated ice cream, coke, popsicles, grape juice, grits, and gelatin and he is planning on ordering soup and pudding for dinner tonight.   Reports UBW 220 lbs most recently. Per chart patient has lost 8 lbs (4% body weight) over 2 months, which is  not significant for time frame. Attempted to weigh patient to assess weight trends since admission, but patient sitting on chair and nervous to ambulate for fear of diarrhea.  Medications reviewed and include: Novolog sliding scale Q4hrs, levothyroxine, pantoprazole, Zosyn, vancomycin, D5-1/2NS with KCl 20 mEq/L @ 50 ml/hr (60 grams dextrose, 204 kcal daily).  Labs reviewed: CBG 96-180 past 24 hrs. On 12/25 patient with Sodium 132, Potassium 3.4, Anion gap 4.   Nutrition-Focused physical exam completed. Findings are no fat depletion, no muscle depletion, and no edema.   Diet Order:  Diet full liquid Room service appropriate? Yes; Fluid consistency: Thin  Skin:  Reviewed, no issues  Last BM:  07/22/2016  Height:   Ht Readings from Last 1 Encounters:  07/10/16 5\' 10"  (1.778 m)    Weight:   Wt Readings from Last 1 Encounters:  07/10/16 215 lb (97.5 kg)    Ideal Body Weight:  75.5 kg  BMI:  Body mass index is 30.85 kg/m.  Estimated Nutritional Needs:   Kcal:  2200-2400 (MSJ x 1.3-1.4)  Protein:  97-117 grams (1-1.2 grams/kg)  Fluid:  >/= 2.2 L/day  EDUCATION NEEDS:   No education needs identified at this time  Willey Blade, MS, RD, LDN Pager: [phone] After Hours Pager: [phone]

## 2016-07-23 NOTE — Progress Notes (Addendum)
Pharmacy Antibiotic Note  Albert Richards is a 75 y.o. male with hx metastatic pancreatic cancer admitted on 07/10/2016 with severe abdominal pain. CT showed free air and worsening metastatic disease with small bowel perforation. Pt underwent s/p exploratory lap and segmental small bowel resection 12/14 and was placed on empiric Zosyn for intra-abdominal infection.  On 12/23 vancomycin was added empirically for HCAP.  Pharmacy is assisting with antimicrobial dosing.  D#13 Zosyn 3.375 grams IV q8h (extended-infusion), D#4 vancomycin 1250 mg IV q12h  SCr stable Leukocytosis improving Vancomycin trough therapeutic  Plan:  Continue Zosyn 3.375 grams IV q8h (extended-infusion).  Continue vancomycin 1250 mg IV q12h.  Follow renal function, clinical course.  Await word on planned duration of antibiotic regimen.   Height: 5\' 10"  (177.8 cm) Weight: 215 lb (97.5 kg) IBW/kg (Calculated) : 73  Temp (24hrs), Avg:97.8 F (36.6 C), Min:97.4 F (36.3 C), Max:98 F (36.7 C)   Recent Labs Lab 07/17/16 0412 07/19/16 1013 07/20/16 0334 07/21/16 0648 07/22/16 0611 07/23/16 0915  WBC 16.9* 25.1* 19.2* 14.1* 12.2*  --   CREATININE 0.82 0.95 0.82 0.88 0.83  --   VANCOTROUGH  --   --   --   --   --  16    Estimated Creatinine Clearance: 88.7 mL/min (by C-G formula based on SCr of 0.83 mg/dL).    No Known Allergies  Antimicrobials this admission: 12/14 Zosyn >> 12/19; resumed 12/20 >> 12/23 Vancomycin >>   Dose adjustments this admission: 12/26 Vancomycin trough 16 on 1250 mg q12h - no change to current dosage   Microbiology results: 12/14: MRSA PCR: neg  Thank you for allowing pharmacy to be a part of this patient's care.  Clayburn Pert, PharmD, BCPS Pager: [phone] 07/23/2016  10:25 AM

## 2016-07-23 NOTE — Care Management Note (Signed)
Case Management Note  Patient Details  Name: [name] MRN: [id number] Date of Birth: 1940-09-28  Subjective/Objective:                  EXPLORATORY LAPAROTOMY, EVACUATION OF HEMATOMA (N/A) Action/Plan: Discharge planning Expected Discharge Date:   (unknown)               Expected Discharge Plan:  [place]  In-House Referral:  NA  Discharge planning Services  CM Consult  Post Acute Care Choice:  Home Health Choice offered to:  Patient, Spouse  DME Arranged:  Walker rolling, 3-N-1 DME Agency:  [place]:  RN, PT, OT, Nurse's Aide [place] Agency:  [place]  Status of Service:  In process, will continue to follow  If discussed at Long Length of Stay Meetings, dates discussed:    Additional Comments: Cm met with pt and wife, [name] to discuss choice of home health agency.  Family choose [facility] to render home health services.  CM has requested HHPT/OT/Aide/RN and face to face from MD; will continue to monitor for orders.  Referral called to [facility] rep, with notation orders and face to face have been requested of MD.  CM has notified [facility] DME rep, Larene Beach to please deliver the 3n1 and rolling walker to room prior to discharge.  CM will continue to follow for orders and F2F.   [name], RN 07/23/2016, 11:51 AM

## 2016-07-24 ENCOUNTER — Inpatient Hospital Stay (HOSPITAL_COMMUNITY): Payer: Medicare Other

## 2016-07-24 LAB — BASIC METABOLIC PANEL
ANION GAP: 4 — AB (ref 5–15)
BUN: <5 mg/dL — ABNORMAL LOW (ref 6–20)
CO2: 26 mmol/L (ref 22–32)
Calcium: 7.3 mg/dL — ABNORMAL LOW (ref 8.9–10.3)
Chloride: 103 mmol/L (ref 101–111)
Creatinine, Ser: 0.97 mg/dL (ref 0.61–1.24)
GFR calc Af Amer: >60 mL/min (ref >60)
GFR calc non Af Amer: >60 mL/min (ref >60)
GLUCOSE: 143 mg/dL — AB (ref 65–99)
POTASSIUM: 3.1 mmol/L — AB (ref 3.5–5.1)
Sodium: 133 mmol/L — ABNORMAL LOW (ref 135–145)

## 2016-07-24 LAB — CBC
HEMATOCRIT: 27.4 % — AB (ref 39.0–52.0)
HEMOGLOBIN: 9.4 g/dL — AB (ref 13.0–17.0)
MCH: 32.8 pg (ref 26.0–34.0)
MCHC: 34.3 g/dL (ref 30.0–36.0)
MCV: 95.5 fL (ref 78.0–100.0)
Platelets: 590 10*3/uL — ABNORMAL HIGH (ref 150–400)
RBC: 2.87 MIL/uL — ABNORMAL LOW (ref 4.22–5.81)
RDW: 17.6 % — ABNORMAL HIGH (ref 11.5–15.5)
WBC: 12 10*3/uL — ABNORMAL HIGH (ref 4.0–10.5)

## 2016-07-24 LAB — GLUCOSE, CAPILLARY
GLUCOSE-CAPILLARY: 131 mg/dL — AB (ref 65–99)
GLUCOSE-CAPILLARY: 157 mg/dL — AB (ref 65–99)
GLUCOSE-CAPILLARY: 189 mg/dL — AB (ref 65–99)
Glucose-Capillary: 116 mg/dL — ABNORMAL HIGH (ref 65–99)
Glucose-Capillary: 119 mg/dL — ABNORMAL HIGH (ref 65–99)
Glucose-Capillary: 156 mg/dL — ABNORMAL HIGH (ref 65–99)

## 2016-07-24 LAB — MAGNESIUM: Magnesium: 1.9 mg/dL (ref 1.7–2.4)

## 2016-07-24 MED ORDER — LEVOTHYROXINE SODIUM 50 MCG PO TABS
175.0000 ug | ORAL_TABLET | ORAL | Status: DC
  Administered 2016-07-25: 10:00:00 175 ug via ORAL
  Filled 2016-07-24 (×2): qty 1

## 2016-07-24 MED ORDER — LEVOTHYROXINE SODIUM 75 MCG PO TABS
150.0000 ug | ORAL_TABLET | ORAL | Status: DC
  Administered 2016-07-24 – 2016-07-26 (×2): 150 ug via ORAL
  Filled 2016-07-24 (×2): qty 2

## 2016-07-24 MED ORDER — POTASSIUM CHLORIDE CRYS ER 20 MEQ PO TBCR
20.0000 meq | EXTENDED_RELEASE_TABLET | Freq: Two times a day (BID) | ORAL | Status: AC
  Administered 2016-07-24 (×2): 20 meq via ORAL
  Filled 2016-07-24 (×2): qty 1

## 2016-07-24 MED ORDER — HYDROGEN PEROXIDE 3 % EX SOLN
CUTANEOUS | Status: AC
  Administered 2016-07-24: 11:00:00
  Filled 2016-07-24: qty 473

## 2016-07-24 NOTE — Progress Notes (Signed)
Patient ID: Albert Richards, male   DOB: 05-Dec-1940, 75 y.o.   MRN: [id number]  [facility] Surgery Progress Note  7 Days Post-Op  Subjective: Continues to have problems controlling his bowels, cannot get to commode quick enough when he has a bowel movement. Tolerating full liquids with no n/v. Had multiple loose BM's yesterday. Afraid to ambulate because of his bowels, but his wife brought some Depends for his to use which he thinks will help. Denies SOB or cough.  Objective: Vital signs in last 24 hours: Temp:  [97.6 F (36.4 C)-98.6 F (37 C)] 97.9 F (36.6 C) (12/27 0556) Pulse Rate:  [59-67] 59 (12/27 0556) Resp:  [17] 17 (12/27 0556) BP: (115-141)/(66-74) 141/66 (12/27 0556) SpO2:  [90 %-96 %] 90 % (12/27 0556) Last BM Date: 07/23/16  Intake/Output from previous day: 12/26 0701 - 12/27 0700 In: [id number] [P.O.:260; I.V.:1170; IV Piggyback:350] Out: 2341 [Urine:2225; Drains:115; Stool:1] Intake/Output this shift: No intake/output data recorded.  PE: Gen: Alert, NAD, cooperative Pulm: CTAB w/ decreased breath sounds in bilateral bases, no W/R/R, effort normal Abd: Soft, nontender, minimal distension, +BS, midline incision covered with clean/dry dressing (will check again tomorrow) Ext: No erythema, edema, or tenderness  Lab Results:   Recent Labs  07/22/16 0611 07/24/16 0453  WBC 12.2* 12.0*  HGB 9.0* 9.4*  HCT 26.0* 27.4*  PLT 561* 590*   BMET  Recent Labs  07/22/16 0611 07/24/16 0453  NA 132* 133*  K 3.4* 3.1*  CL 101 103  CO2 27 26  GLUCOSE 150* 143*  BUN 8 <5*  CREATININE 0.83 0.97  CALCIUM 7.3* 7.3*   PT/INR No results for input(s): LABPROT, INR in the last 72 hours. CMP     Component Value Date/Time   NA 133 (L) 07/24/2016 0453   NA 140 04/08/2016 1112   K 3.1 (L) 07/24/2016 0453   K 4.3 04/08/2016 1112   CL 103 07/24/2016 0453   CO2 26 07/24/2016 0453   CO2 26 04/08/2016 1112   GLUCOSE 143 (H) 07/24/2016 0453   GLUCOSE 137  04/08/2016 1112   BUN <5 (L) 07/24/2016 0453   BUN 16.8 04/08/2016 1112   CREATININE 0.97 07/24/2016 0453   CREATININE 1.1 04/08/2016 1112   CALCIUM 7.3 (L) 07/24/2016 0453   CALCIUM 9.3 04/08/2016 1112   PROT 5.0 (L) 07/20/2016 0334   PROT 6.7 07/20/2013 1432   ALBUMIN 1.8 (L) 07/20/2016 0334   ALBUMIN 3.4 (L) 07/20/2013 1432   AST 15 07/20/2016 0334   AST 15 07/20/2013 1432   ALT 9 (L) 07/20/2016 0334   ALT 15 07/20/2013 1432   ALKPHOS 77 07/20/2016 0334   ALKPHOS 81 07/20/2013 1432   BILITOT 0.9 07/20/2016 0334   BILITOT 0.40 07/20/2013 1432   GFRNONAA >60 07/24/2016 0453   GFRAA >60 07/24/2016 0453   Lipase     Component Value Date/Time   LIPASE 19 07/10/2016 2206       Studies/Results: Dg Chest Port 1 View  Result Date: 07/24/2016 CLINICAL DATA:  Aspiration pneumonia. EXAM: PORTABLE CHEST 1 VIEW COMPARISON:  07/17/2016. FINDINGS: Port-A-Cath noted with tip projected over the cavoatrial junction. Cardiomegaly. Bilateral basilar pulmonary infiltrates/edema with small pleural effusions. No pneumothorax . IMPRESSION: 1.  Port-A-Cath noted with tip at the cavoatrial junction. 2. Cardiomegaly with bibasilar pulmonary infiltrates/edema. Small bilateral pleural effusions. Congestive heart failure cannot be excluded. 3. Low lung volumes with basilar atelectasis. Electronically Signed   By: Marcello Moores  Register   On: 07/24/2016 07:07  Anti-infectives: Anti-infectives    Start     Dose/Rate Route Frequency Ordered Stop   07/20/16 1000  vancomycin (VANCOCIN) 1,250 mg in sodium chloride 0.9 % 250 mL IVPB     1,250 mg 166.7 mL/hr over 90 Minutes Intravenous Every 12 hours 07/19/16 1741     07/19/16 1800  vancomycin (VANCOCIN) 1,500 mg in sodium chloride 0.9 % 500 mL IVPB     1,500 mg 250 mL/hr over 120 Minutes Intravenous  Once 07/19/16 1741 07/20/16 0316   07/18/16 1200  piperacillin-tazobactam (ZOSYN) IVPB 4.5 g  Status:  Discontinued     4.5 g 200 mL/hr over 30 Minutes  Intravenous Every 6 hours 07/18/16 0729 07/18/16 0730   07/18/16 1000  levofloxacin (LEVAQUIN) IVPB 750 mg  Status:  Discontinued     750 mg 100 mL/hr over 90 Minutes Intravenous Every 24 hours 07/18/16 0729 07/18/16 0852   07/18/16 0830  piperacillin-tazobactam (ZOSYN) IVPB 3.375 g     3.375 g 12.5 mL/hr over 240 Minutes Intravenous Every 8 hours 07/18/16 0732     07/18/16 0745  piperacillin-tazobactam (ZOSYN) IVPB 3.375 g  Status:  Discontinued     3.375 g 12.5 mL/hr over 240 Minutes Intravenous Every 8 hours 07/18/16 0730 07/18/16 0732   07/18/16 0030  piperacillin-tazobactam (ZOSYN) IVPB 3.375 g  Status:  Discontinued     3.375 g 12.5 mL/hr over 240 Minutes Intravenous Every 8 hours 07/18/16 0023 07/18/16 0729   07/17/16 1600  piperacillin-tazobactam (ZOSYN) IVPB 3.375 g  Status:  Discontinued     3.375 g 12.5 mL/hr over 240 Minutes Intravenous Every 8 hours 07/17/16 1515 07/18/16 0023   07/11/16 1000  piperacillin-tazobactam (ZOSYN) IVPB 3.375 g     3.375 g 12.5 mL/hr over 240 Minutes Intravenous Every 8 hours 07/11/16 0705 07/16/16 0528   07/11/16 0030  piperacillin-tazobactam (ZOSYN) IVPB 3.375 g     3.375 g 12.5 mL/hr over 240 Minutes Intravenous  Once 07/11/16 0028 07/11/16 0244       Assessment/Plan Small bowel perforation in the setting of metastatic pancreatic adenocarcinoma S/P EXPLORATORY LAPAROTOMY, SEGMENTAL SMALL BOWEL RESECTION, 07/11/16 Dr. Autumn Messing III - POD#13 - Surgical path - small bowel w/ full thickness adenocarcinoma, margins negative for dysplasia/tumor, all 8 nodes negative.  S/p reopening of recent laparotomy, evacuation of hematoma 12/20 Dr. Kieth Brightly - POD 7 - no leak found intraoperatively - drain (1) 10cc serosanguinous, drain (2) 105cc serosanguinous  Possible HCAP Pneumonia  - zosyn and vanc day #5/7  Pancreatic cancer- last dose chemotherapy 07/02/16. Followed by Dr. London Pepper Diabetes Mellitus - Moderate SSI Hypothyroidism -  levothyroxine 119mcg Sun/M/W/F and 120mcg T/Th/Sat COPD GERD - protonix 40mg   FEN: IVF, soft ID: Zosyn 12/14>>12/19, zosyn 12/20>>, vanc 12/23>> (day #5/7) VTE: heparin/ SCD's  Plan: ok to try soft diet, but just take this slow. Replace potassium. Switch protonix and levothyroxine to PO's. Continue to encourage more mobilization/ambulation and IS. Continue zosyn/vanc through Friday (total of 7 days); will recheck labs at that time.  Will confirm with MD if 1 or both drains can be removed today.    LOS: 13 days    Jerrye Beavers , [facility] Surgery 07/24/2016, 8:15 AM Pager: [phone] Consults: [phone] Mon-Fri 7:00 am-4:30 pm Sat-Sun 7:00 am-11:30 am  Agree with above. Remove drain #1 since it has the least drainage the last 24 hours.  Bottom of wound is a little soupy.  This may explain some of the elevated WBC. Wife in room. He said  that Dr. Tressie Stalker came by on Christmas Day.  Alphonsa Overall, MD, [facility] Surgery Pager: [phone] Office phone:  [phone]

## 2016-07-25 LAB — GLUCOSE, CAPILLARY
GLUCOSE-CAPILLARY: 110 mg/dL — AB (ref 65–99)
GLUCOSE-CAPILLARY: 149 mg/dL — AB (ref 65–99)
GLUCOSE-CAPILLARY: 151 mg/dL — AB (ref 65–99)
GLUCOSE-CAPILLARY: 175 mg/dL — AB (ref 65–99)
Glucose-Capillary: 141 mg/dL — ABNORMAL HIGH (ref 65–99)
Glucose-Capillary: 135 mg/dL — ABNORMAL HIGH (ref 65–99)
Glucose-Capillary: 157 mg/dL — ABNORMAL HIGH (ref 65–99)

## 2016-07-25 MED ORDER — PANTOPRAZOLE SODIUM 40 MG PO TBEC
40.0000 mg | DELAYED_RELEASE_TABLET | Freq: Every day | ORAL | Status: DC
  Administered 2016-07-25: 40 mg via ORAL
  Filled 2016-07-25: qty 1

## 2016-07-25 NOTE — Progress Notes (Signed)
Patient ID: Albert Richards, male   DOB: 05-Jul-1941, 75 y.o.   MRN: [id number]  [facility] Surgery Progress Note  8 Days Post-Op  Subjective: States that he continues to feel a little better each day. Had 1 BM yesterday and no issues with incontinence. Tolerating full liquids and plans to try soft diet for breakfast. Denies n/v.  Objective: Vital signs in last 24 hours: Temp:  [97.9 F (36.6 C)-98.7 F (37.1 C)] 98.7 F (37.1 C) (12/28 0533) Pulse Rate:  [68-72] 71 (12/28 0533) Resp:  [17-18] 18 (12/28 0533) BP: (121-140)/(62-71) 121/62 (12/28 0533) SpO2:  [90 %] 90 % (12/28 0533) Last BM Date: 07/24/16  Intake/Output from previous day: 12/27 0701 - 12/28 0700 In: 3737.5 [P.O.:2480; I.V.:1200; IV Piggyback:57.5] Out: 2940 [Urine:2775; Drains:165] Intake/Output this shift: No intake/output data recorded.  PE: Gen: Alert, NAD, cooperative Pulm: CTAB, no W/R/R, effort normal Abd: Soft, nontender, mild distension, +BS, open midline incision healing well with beefy red tissue and trace serous drainage at base of wound Ext: No erythema, edema, or tenderness  Lab Results:   Recent Labs  07/24/16 0453  WBC 12.0*  HGB 9.4*  HCT 27.4*  PLT 590*   BMET  Recent Labs  07/24/16 0453  NA 133*  K 3.1*  CL 103  CO2 26  GLUCOSE 143*  BUN <5*  CREATININE 0.97  CALCIUM 7.3*   PT/INR No results for input(s): LABPROT, INR in the last 72 hours. CMP     Component Value Date/Time   NA 133 (L) 07/24/2016 0453   NA 140 04/08/2016 1112   K 3.1 (L) 07/24/2016 0453   K 4.3 04/08/2016 1112   CL 103 07/24/2016 0453   CO2 26 07/24/2016 0453   CO2 26 04/08/2016 1112   GLUCOSE 143 (H) 07/24/2016 0453   GLUCOSE 137 04/08/2016 1112   BUN <5 (L) 07/24/2016 0453   BUN 16.8 04/08/2016 1112   CREATININE 0.97 07/24/2016 0453   CREATININE 1.1 04/08/2016 1112   CALCIUM 7.3 (L) 07/24/2016 0453   CALCIUM 9.3 04/08/2016 1112   PROT 5.0 (L) 07/20/2016 0334   PROT 6.7  07/20/2013 1432   ALBUMIN 1.8 (L) 07/20/2016 0334   ALBUMIN 3.4 (L) 07/20/2013 1432   AST 15 07/20/2016 0334   AST 15 07/20/2013 1432   ALT 9 (L) 07/20/2016 0334   ALT 15 07/20/2013 1432   ALKPHOS 77 07/20/2016 0334   ALKPHOS 81 07/20/2013 1432   BILITOT 0.9 07/20/2016 0334   BILITOT 0.40 07/20/2013 1432   GFRNONAA >60 07/24/2016 0453   GFRAA >60 07/24/2016 0453   Lipase     Component Value Date/Time   LIPASE 19 07/10/2016 2206       Studies/Results: Dg Chest Port 1 View  Result Date: 07/24/2016 CLINICAL DATA:  Aspiration pneumonia. EXAM: PORTABLE CHEST 1 VIEW COMPARISON:  07/17/2016. FINDINGS: Port-A-Cath noted with tip projected over the cavoatrial junction. Cardiomegaly. Bilateral basilar pulmonary infiltrates/edema with small pleural effusions. No pneumothorax . IMPRESSION: 1.  Port-A-Cath noted with tip at the cavoatrial junction. 2. Cardiomegaly with bibasilar pulmonary infiltrates/edema. Small bilateral pleural effusions. Congestive heart failure cannot be excluded. 3. Low lung volumes with basilar atelectasis. Electronically Signed   By: Marcello Moores  Register   On: 07/24/2016 07:07    Anti-infectives: Anti-infectives    Start     Dose/Rate Route Frequency Ordered Stop   07/20/16 1000  vancomycin (VANCOCIN) 1,250 mg in sodium chloride 0.9 % 250 mL IVPB     1,250 mg 166.7 mL/hr  over 90 Minutes Intravenous Every 12 hours 07/19/16 1741     07/19/16 1800  vancomycin (VANCOCIN) 1,500 mg in sodium chloride 0.9 % 500 mL IVPB     1,500 mg 250 mL/hr over 120 Minutes Intravenous  Once 07/19/16 1741 07/20/16 0316   07/18/16 1200  piperacillin-tazobactam (ZOSYN) IVPB 4.5 g  Status:  Discontinued     4.5 g 200 mL/hr over 30 Minutes Intravenous Every 6 hours 07/18/16 0729 07/18/16 0730   07/18/16 1000  levofloxacin (LEVAQUIN) IVPB 750 mg  Status:  Discontinued     750 mg 100 mL/hr over 90 Minutes Intravenous Every 24 hours 07/18/16 0729 07/18/16 0852   07/18/16 0830   piperacillin-tazobactam (ZOSYN) IVPB 3.375 g     3.375 g 12.5 mL/hr over 240 Minutes Intravenous Every 8 hours 07/18/16 0732     07/18/16 0745  piperacillin-tazobactam (ZOSYN) IVPB 3.375 g  Status:  Discontinued     3.375 g 12.5 mL/hr over 240 Minutes Intravenous Every 8 hours 07/18/16 0730 07/18/16 0732   07/18/16 0030  piperacillin-tazobactam (ZOSYN) IVPB 3.375 g  Status:  Discontinued     3.375 g 12.5 mL/hr over 240 Minutes Intravenous Every 8 hours 07/18/16 0023 07/18/16 0729   07/17/16 1600  piperacillin-tazobactam (ZOSYN) IVPB 3.375 g  Status:  Discontinued     3.375 g 12.5 mL/hr over 240 Minutes Intravenous Every 8 hours 07/17/16 1515 07/18/16 0023   07/11/16 1000  piperacillin-tazobactam (ZOSYN) IVPB 3.375 g     3.375 g 12.5 mL/hr over 240 Minutes Intravenous Every 8 hours 07/11/16 0705 07/16/16 0528   07/11/16 0030  piperacillin-tazobactam (ZOSYN) IVPB 3.375 g     3.375 g 12.5 mL/hr over 240 Minutes Intravenous  Once 07/11/16 0028 07/11/16 0244       Assessment/Plan Small bowel perforation in the setting of metastatic pancreatic adenocarcinoma S/P EXPLORATORY LAPAROTOMY, SEGMENTAL SMALL BOWEL RESECTION, 07/11/16 Dr. Autumn Messing III - POD#14 - Surgical path - small bowel w/ full thickness adenocarcinoma, margins negative for dysplasia/tumor, all 8 nodes negative.  S/p reopening of recent laparotomy, evacuation of hematoma 12/20 Dr. Kieth Brightly - POD 8 - no leak found intraoperatively - drain (1) pulled yesterday, drain (2) 155cc serosanguinous  Possible HCAP Pneumonia  - zosyn and vanc day #6/7  Pancreatic cancer- last dose chemotherapy 07/02/16. Followed by Dr. London Pepper Diabetes Mellitus - Moderate SSI Hypothyroidism - levothyroxine 117mcg Sun/M/W/F and 143mcg T/Th/Sat COPD GERD - protonix 40mg   FEN: IVF, soft ID: Zosyn 12/14>>12/19, zosyn 12/20>>, vanc 12/23>> (day #6/7) VTE: heparin/ SCD's  Plan: 2nd drain pulled. Continue BID wet to dry dressing changes  to midline abdominal wound, and dry dressings as needed to drain sites. Home health has already been consulted. Continue zosyn/vanc through tomorrow (total of 7 days); plan to recheck CBC/BMP tomorrow.  Patient mobility improving, continue to encourage more mobilization/ambulation and Is. Possibly home tomorrow.   LOS: 14 days    Jerrye Beavers , [facility] Surgery 07/25/2016, 8:04 AM Pager: [phone] Consults: [phone] Mon-Fri 7:00 am-4:30 pm Sat-Sun 7:00 am-11:30 am  Agree with above. He is still going slowly with ambulation. He is on a soft diet.  Plan is HHC at home to help with wound and dressing changes. Wife is being taught to change dressing.  Possibly home Friday, more likely Saturday or Sunday.  Alphonsa Overall, MD, [facility] Surgery Pager: [phone] Office phone:  [phone]

## 2016-07-25 NOTE — Progress Notes (Signed)
Occupational Therapy Treatment Patient Details Name: Albert Richards MRN: [id number] DOB: 1941-02-29 Today's Date: 07/25/2016    History of present illness The patient is a 75 year old white male who has a history of metastatic pancreatic cancer for the last 3 years, adm with severe abd pain;  s/p EXPLORATORY LAPAROTOMY  with segmental small bowel resection on 07/11/16;  12/20 EXPLORATORY LAPAROTOMY, EVACUATION OF HEMATOMA (N/A)   OT comments  Pt. And wife were educated on proper technique for sit to stand and stand to sit for proper hand placement. Pt. Was S with supine to sit and stand pivot transfer to commode. Pt. Was ablae to ambulate with walker 3 feet to recliner chair. Pt. And wife refused to practice shower transfer and stated he will take a sink bath initially. OT to continue to follow.   Follow Up Recommendations  Home health OT;Supervision/Assistance - 24 hour    Equipment Recommendations       Recommendations for Other Services      Precautions / Restrictions Precautions Precautions: Fall Precaution Comments: abd incision/wound, drains, diarrhea       Mobility Bed Mobility       Sidelying to sit: Supervision   Sit to supine: Supervision      Transfers   Equipment used: Rolling walker (2 wheeled)   Sit to Stand: Supervision Stand pivot transfers: Supervision       General transfer comment: VC for hand placement    Balance                                   ADL       Grooming: Wash/dry hands;Wash/dry face;Supervision/safety                   Toilet Transfer: English as a second language teacher Details (indicate cue type and reason):  (Pt. requries cues for proper hand placement.) Toileting- Clothing Manipulation and Hygiene: Supervision/safety;Sit to/from stand         General ADL Comments: Pt. wife states she will assist with LE dressing and does not want AE.       Vision                     Perception     Praxis      Cognition   Behavior During Therapy: WFL for tasks assessed/performed Overall Cognitive Status: Within Functional Limits for tasks assessed                       Extremity/Trunk Assessment               Exercises     Shoulder Instructions       General Comments      Pertinent Vitals/ Pain       Pain Assessment: 0-10 Pain Score: 2  Faces Pain Scale: Hurts a little bit Pain Location: stomach Pain Descriptors / Indicators: Aching Pain Intervention(s): Monitored during session  Home Living                                          Prior Functioning/Environment              Frequency           Progress Toward Goals  OT Goals(current goals can now be found in the care  plan section)  Progress towards OT goals: Progressing toward goals  Acute Rehab OT Goals Patient Stated Goal: go home  Plan Discharge plan remains appropriate    Co-evaluation                 End of Session Equipment Utilized During Treatment: Gait belt;Rolling walker   Activity Tolerance Patient tolerated treatment well   Patient Left in chair;with call bell/phone within reach;with family/visitor present   Nurse Communication          Time: 1610-9604 OT Time Calculation (min): 30 min  Charges: OT General Charges $OT Visit: 1 Procedure OT Treatments $Self Care/Home Management : 23-37 mins  Amado Andal 07/25/2016, 9:39 AM

## 2016-07-25 NOTE — Progress Notes (Signed)
Key Points: Use following P&T approved IV to PO non-antibiotic change policy.  Description contains the criteria that are approved Note: Policy Excludes:  Esophagectomy patientsPHARMACIST - PHYSICIAN COMMUNICATION DR:   CCS CONCERNING: IV to Oral Route Change Policy  RECOMMENDATION: This patient is receiving protonix by the intravenous route.  Based on criteria approved by the Pharmacy and Therapeutics Committee, the intravenous medication(s) is/are being converted to the equivalent oral dose form(s).   DESCRIPTION: These criteria include:  The patient is eating (either orally or via tube) and/or has been taking other orally administered medications for a least 24 hours  The patient has no evidence of active gastrointestinal bleeding or impaired GI absorption (gastrectomy, short bowel, patient on TNA or NPO).  If you have questions about this conversion, please contact the Pharmacy Department  []   [phone] )  [facility] []   [phone] )  [facility]  []   [phone] )  [facility] [x]   [phone] )  [place], [place], [facility] 07/25/2016 10:22 AM

## 2016-07-25 NOTE — Progress Notes (Signed)
Physical Therapy Treatment Patient Details Name: Albert Richards MRN: [id number] DOB: 05-Jun-1941 Today's Date: 07/25/2016    History of Present Illness The patient is a 75 year old white male who has a history of metastatic pancreatic cancer for the last 3 years, adm with severe abd pain;  s/p EXPLORATORY LAPAROTOMY  with segmental small bowel resection on 07/11/16;  12/20 EXPLORATORY LAPAROTOMY, EVACUATION OF HEMATOMA (N/A)    PT Comments    The patient continues to ambulate. Will practice steps tomorrow. Continue PT in acute care.   Follow Up Recommendations  Home health PT     Equipment Recommendations  Rolling walker with 5" wheels    Recommendations for Other Services       Precautions / Restrictions Precautions Precautions: Fall Precaution Comments: abd incision/wound, drains, diarrhea    Mobility  Bed Mobility   Bed Mobility: Supine to Sit     Supine to sit: Min guard;HOB elevated     General bed mobility comments: use of rails and HOB raised  Transfers Overall transfer level: Needs assistance Equipment used: Rolling walker (2 wheeled) Transfers: Sit to/from Stand Sit to Stand: Supervision         General transfer comment: VC for hand placement  Ambulation/Gait Ambulation/Gait assistance: Min guard Ambulation Distance (Feet): 400 Feet Assistive device: Rolling walker (2 wheeled) Gait Pattern/deviations: Step-through pattern     General Gait Details: increased time, slow but steady gait      Stairs            Wheelchair Mobility    Modified Rankin (Stroke Patients Only)       Balance           Standing balance support: During functional activity;Bilateral upper extremity supported Standing balance-Leahy Scale: Fair                      Cognition Arousal/Alertness: Awake/alert                          Exercises      General Comments        Pertinent Vitals/Pain Faces Pain Scale: Hurts a little  bit Pain Location: stomach Pain Descriptors / Indicators: Aching Pain Intervention(s): Monitored during session    Home Living                      Prior Function            PT Goals (current goals can now be found in the care plan section) Acute Rehab PT Goals Patient Stated Goal: go home PT Goal Formulation: With patient/family Time For Goal Achievement: 08/08/16 Potential to Achieve Goals: Good Progress towards PT goals: Progressing toward goals;Goals met and updated - see care plan    Frequency    Min 3X/week      PT Plan Current plan remains appropriate    Co-evaluation             End of Session   Activity Tolerance: Patient tolerated treatment well Patient left: in chair;with call bell/phone within reach;with family/visitor present     Time: 3845-3646 PT Time Calculation (min) (ACUTE ONLY): 10 min  Charges:  $Gait Training: 8-22 mins                    G Codes:      [name] 07/25/2016, 4:38 PM [name] PT [phone]

## 2016-07-26 LAB — CBC
HEMATOCRIT: 27.7 % — AB (ref 39.0–52.0)
Hemoglobin: 9.6 g/dL — ABNORMAL LOW (ref 13.0–17.0)
MCH: 33.1 pg (ref 26.0–34.0)
MCHC: 34.7 g/dL (ref 30.0–36.0)
MCV: 95.5 fL (ref 78.0–100.0)
PLATELETS: 611 10*3/uL — AB (ref 150–400)
RBC: 2.9 MIL/uL — ABNORMAL LOW (ref 4.22–5.81)
RDW: 17.5 % — AB (ref 11.5–15.5)
WBC: 12.2 10*3/uL — AB (ref 4.0–10.5)

## 2016-07-26 LAB — BASIC METABOLIC PANEL
ANION GAP: 4 — AB (ref 5–15)
BUN: 5 mg/dL — ABNORMAL LOW (ref 6–20)
CO2: 28 mmol/L (ref 22–32)
CREATININE: 0.78 mg/dL (ref 0.61–1.24)
Calcium: 7.6 mg/dL — ABNORMAL LOW (ref 8.9–10.3)
Chloride: 101 mmol/L (ref 101–111)
Glucose, Bld: 175 mg/dL — ABNORMAL HIGH (ref 65–99)
Potassium: 3.8 mmol/L (ref 3.5–5.1)
SODIUM: 133 mmol/L — AB (ref 135–145)

## 2016-07-26 LAB — GLUCOSE, CAPILLARY
GLUCOSE-CAPILLARY: 174 mg/dL — AB (ref 65–99)
Glucose-Capillary: 129 mg/dL — ABNORMAL HIGH (ref 65–99)
Glucose-Capillary: 139 mg/dL — ABNORMAL HIGH (ref 65–99)
Glucose-Capillary: 151 mg/dL — ABNORMAL HIGH (ref 65–99)

## 2016-07-26 MED ORDER — ENSURE ENLIVE PO LIQD: 237.0000 mL | Freq: Three times a day (TID) | ORAL | 12 refills | Status: DC

## 2016-07-26 MED ORDER — METHOCARBAMOL 750 MG PO TABS: 750.0000 mg | ORAL_TABLET | Freq: Three times a day (TID) | ORAL | 0 refills | Status: DC | PRN

## 2016-07-26 MED ORDER — METHOCARBAMOL 500 MG PO TABS
750.0000 mg | ORAL_TABLET | Freq: Three times a day (TID) | ORAL | Status: DC | PRN
  Filled 2016-07-26: qty 2

## 2016-07-26 MED ORDER — ONDANSETRON 4 MG PO TBDP: 4.0000 mg | ORAL_TABLET | Freq: Four times a day (QID) | ORAL | 0 refills | Status: DC | PRN

## 2016-07-26 MED ORDER — HEPARIN SOD (PORK) LOCK FLUSH 100 UNIT/ML IV SOLN
500.0000 [IU] | INTRAVENOUS | Status: AC | PRN
  Administered 2016-07-26: 13:00:00 500 [IU]

## 2016-07-26 NOTE — Progress Notes (Signed)
Pharmacy Antibiotic Note  Albert Richards is a 75 y.o. male with hx metastatic pancreatic cancer admitted on 07/10/2016 with severe abdominal pain. CT showed free air and worsening metastatic disease with small bowel perforation. Pt underwent s/p exploratory lap and segmental small bowel resection 12/14 and was placed on empiric Zosyn for intra-abdominal infection.  On 12/23 vancomycin was added empirically for HCAP.  Pharmacy is assisting with antimicrobial dosing.  D#16 Zosyn 3.375 grams IV q8h (extended-infusion), D#7 of 7 vancomycin 1250 mg IV q12h   Plan: Continue Vanc/Zosyn today to complete 7 days of combined therapy with both and then discontinue  Height: 5\' 10"  (177.8 cm) Weight: 215 lb (97.5 kg) IBW/kg (Calculated) : 73  Temp (24hrs), Avg:98.3 F (36.8 C), Min:98.1 F (36.7 C), Max:98.4 F (36.9 C)   Recent Labs Lab 07/20/16 0334 07/21/16 0648 07/22/16 0611 07/23/16 0915 07/24/16 0453 07/26/16 0442  WBC 19.2* 14.1* 12.2*  --  12.0* 12.2*  CREATININE 0.82 0.88 0.83  --  0.97 0.78  VANCOTROUGH  --   --   --  16  --   --     Estimated Creatinine Clearance: 92 mL/min (by C-G formula based on SCr of 0.78 mg/dL).    No Known Allergies  Antimicrobials this admission: 12/14 Zosyn >> 12/19; resumed 12/20 >> 12/23 Vancomycin >>   Dose adjustments this admission: 12/26 Vancomycin trough 16 on 1250 mg q12h - no change to current dosage   Microbiology results: 12/14: MRSA PCR: neg   Thank you for allowing pharmacy to be a part of this patient's care.   Adrian Saran, PharmD, BCPS Pager [phone] 07/26/2016 9:39 AM

## 2016-07-26 NOTE — Progress Notes (Signed)
Spoke with pt and wife concerning home health. [facility] have no RN's available in the area this weekend or next week. Checked with two other agencies, [facility] took the case.  This was explained to pt and wife at bedside. [facility] will follow pt at home with HHRN/PT, referral given to in house rep.

## 2016-07-26 NOTE — Progress Notes (Signed)
Patient ID: Albert Richards, male   DOB: Jul 16, 1941, 75 y.o.   MRN: [id number]  [facility] Surgery Progress Note  9 Days Post-Op  Subjective: No complaints. States that he is ready to go home. Pain is well controlled. He is tolerating a soft diet. Plans to work on stairs with PT today.  Objective: Vital signs in last 24 hours: Temp:  [98.1 F (36.7 C)-98.4 F (36.9 C)] 98.4 F (36.9 C) (12/29 0508) Pulse Rate:  [60-72] 60 (12/29 0508) Resp:  [16-18] 16 (12/29 0508) BP: (136-146)/(68-77) 144/69 (12/29 0508) SpO2:  [88 %-94 %] 93 % (12/29 0508) Last BM Date: 07/24/16  Intake/Output from previous day: 12/28 0701 - 12/29 0700 In: 3050 [P.O.:820; I.V.:1150; IV Piggyback:1080] Out: 2925 [Urine:2925] Intake/Output this shift: Total I/O In: -  Out: 725 [Urine:725]  PE: Gen: Alert, NAD, cooperative Pulm: CTAB, no W/R/R, effort normal Abd: Soft, nontender, mild distension, +BS, open midline incision healing well with beefy red tissue and trace serous drainage at base of wound Ext: No erythema, edema, or tenderness  Lab Results:   Recent Labs  07/24/16 0453 07/26/16 0442  WBC 12.0* 12.2*  HGB 9.4* 9.6*  HCT 27.4* 27.7*  PLT 590* 611*   BMET  Recent Labs  07/24/16 0453 07/26/16 0442  NA 133* 133*  K 3.1* 3.8  CL 103 101  CO2 26 28  GLUCOSE 143* 175*  BUN <5* 5*  CREATININE 0.97 0.78  CALCIUM 7.3* 7.6*   PT/INR No results for input(s): LABPROT, INR in the last 72 hours. CMP     Component Value Date/Time   NA 133 (L) 07/26/2016 0442   NA 140 04/08/2016 1112   K 3.8 07/26/2016 0442   K 4.3 04/08/2016 1112   CL 101 07/26/2016 0442   CO2 28 07/26/2016 0442   CO2 26 04/08/2016 1112   GLUCOSE 175 (H) 07/26/2016 0442   GLUCOSE 137 04/08/2016 1112   BUN 5 (L) 07/26/2016 0442   BUN 16.8 04/08/2016 1112   CREATININE 0.78 07/26/2016 0442   CREATININE 1.1 04/08/2016 1112   CALCIUM 7.6 (L) 07/26/2016 0442   CALCIUM 9.3 04/08/2016 1112   PROT 5.0 (L)  07/20/2016 0334   PROT 6.7 07/20/2013 1432   ALBUMIN 1.8 (L) 07/20/2016 0334   ALBUMIN 3.4 (L) 07/20/2013 1432   AST 15 07/20/2016 0334   AST 15 07/20/2013 1432   ALT 9 (L) 07/20/2016 0334   ALT 15 07/20/2013 1432   ALKPHOS 77 07/20/2016 0334   ALKPHOS 81 07/20/2013 1432   BILITOT 0.9 07/20/2016 0334   BILITOT 0.40 07/20/2013 1432   GFRNONAA >60 07/26/2016 0442   GFRAA >60 07/26/2016 0442   Lipase     Component Value Date/Time   LIPASE 19 07/10/2016 2206       Studies/Results: No results found.  Anti-infectives: Anti-infectives    Start     Dose/Rate Route Frequency Ordered Stop   07/20/16 1000  vancomycin (VANCOCIN) 1,250 mg in sodium chloride 0.9 % 250 mL IVPB     1,250 mg 166.7 mL/hr over 90 Minutes Intravenous Every 12 hours 07/19/16 1741     07/19/16 1800  vancomycin (VANCOCIN) 1,500 mg in sodium chloride 0.9 % 500 mL IVPB     1,500 mg 250 mL/hr over 120 Minutes Intravenous  Once 07/19/16 1741 07/20/16 0316   07/18/16 1200  piperacillin-tazobactam (ZOSYN) IVPB 4.5 g  Status:  Discontinued     4.5 g 200 mL/hr over 30 Minutes Intravenous Every 6 hours 07/18/16  [id number] 07/18/16 0730   07/18/16 1000  levofloxacin (LEVAQUIN) IVPB 750 mg  Status:  Discontinued     750 mg 100 mL/hr over 90 Minutes Intravenous Every 24 hours 07/18/16 0729 07/18/16 0852   07/18/16 0830  piperacillin-tazobactam (ZOSYN) IVPB 3.375 g     3.375 g 12.5 mL/hr over 240 Minutes Intravenous Every 8 hours 07/18/16 0732     07/18/16 0745  piperacillin-tazobactam (ZOSYN) IVPB 3.375 g  Status:  Discontinued     3.375 g 12.5 mL/hr over 240 Minutes Intravenous Every 8 hours 07/18/16 0730 07/18/16 0732   07/18/16 0030  piperacillin-tazobactam (ZOSYN) IVPB 3.375 g  Status:  Discontinued     3.375 g 12.5 mL/hr over 240 Minutes Intravenous Every 8 hours 07/18/16 0023 07/18/16 0729   07/17/16 1600  piperacillin-tazobactam (ZOSYN) IVPB 3.375 g  Status:  Discontinued     3.375 g 12.5 mL/hr over 240 Minutes  Intravenous Every 8 hours 07/17/16 1515 07/18/16 0023   07/11/16 1000  piperacillin-tazobactam (ZOSYN) IVPB 3.375 g     3.375 g 12.5 mL/hr over 240 Minutes Intravenous Every 8 hours 07/11/16 0705 07/16/16 0528   07/11/16 0030  piperacillin-tazobactam (ZOSYN) IVPB 3.375 g     3.375 g 12.5 mL/hr over 240 Minutes Intravenous  Once 07/11/16 0028 07/11/16 0244       Assessment/Plan Small bowel perforation in the setting of metastatic pancreatic adenocarcinoma S/P EXPLORATORY LAPAROTOMY, SEGMENTAL SMALL BOWEL RESECTION, 07/11/16 Dr. Autumn Messing III - POD#15 - Surgical path - small bowel w/ full thickness adenocarcinoma, margins negative for dysplasia/tumor, all 8 nodes negative.  S/p reopening of recent laparotomy, evacuation of hematoma 12/20 Dr. Kieth Brightly - POD 9 - no leak found intraoperatively - both drains have been removed  Possible HCAP Pneumonia  - zosyn and vanc day #7/7  Pancreatic cancer- last dose chemotherapy 07/02/16. Followed by Dr. London Pepper Diabetes Mellitus - Moderate SSI Hypothyroidism - levothyroxine 170mcg Sun/M/W/F and 145mcg T/Th/Sat COPD GERD - protonix 40mg   FEN: IVF, soft ID: Zosyn 12/14>>12/19, zosyn 12/20>>, vanc 12/23>>(day #7/7) VTE: heparin/ SCD's  Plan: Plans to practice stairs with PT today; if this goes well he will be ready for discharge later today. Home health has been arranged to assist with PT and dressing changes; his wife is also feeling more comfortable helping with dressing changes. He will not need to continue antibiotics after today. Follow-up with Dr. Marlou Starks in about 2 weeks    LOS: 53 days    Jerrye Beavers , [facility] Surgery 07/26/2016, 8:14 AM Pager: [phone] Consults: [phone] Mon-Fri 7:00 am-4:30 pm Sat-Sun 7:00 am-11:30 am  Agree with above. Should be able to go home today. He has plans for home and plans for follow up.  Alphonsa Overall, MD, [facility] Surgery Pager:  [phone] Office phone:  [phone]

## 2016-07-26 NOTE — Discharge Instructions (Addendum)
MIDLINE WOUND CARE: - midline dressing to be changed twice daily - supplies: sterile saline, kerlix, scissors, ABD pads, tape  - remove dressing and all packing carefully, moistening with sterile saline as needed to avoid packing/internal dressing sticking to the wound. - clean edges of skin around the wound with water/gauze, making sure there is no tape debris or leakage left on skin that could cause skin irritation or breakdown. - dampen and clean kerlix with sterile saline and pack wound from wound base to skin level, making sure to take note of any possible areas of wound tracking, tunneling and packing appropriately. Wound can be packed loosely. Trim kerlix to size if a whole kerlix is not required. - cover wound with a dry ABD pad and secure with tape.  - write the date/time on the dry dressing/tape to better track when the last dressing change occurred. - apply any skin protectant/powder recommended by clinician to protect skin/skin folds. - change dressing as needed if leakage occurs, wound gets contaminated, or patient requests to shower. - patient may shower daily with wound open and following the shower the wound should be dried and a clean dressing placed.   [facility], [place] [phone]  OPEN ABDOMINAL SURGERY: POST OP INSTRUCTIONS  Always review your discharge instruction sheet given to you by the facility where your surgery was performed.  IF YOU HAVE DISABILITY OR FAMILY LEAVE FORMS, YOU MUST BRING THEM TO THE OFFICE FOR PROCESSING.  PLEASE DO NOT GIVE THEM TO YOUR DOCTOR.  1. A prescription for pain medication may be given to you upon discharge.  Take your pain medication as prescribed, if needed.  If narcotic pain medicine is not needed, then you may take acetaminophen (Tylenol) or ibuprofen (Advil) as needed. 2. Take your usually prescribed medications unless otherwise directed. 3. If you need a refill on your pain medication, please contact your  pharmacy. They will contact our office to request authorization.  Prescriptions will not be filled after 5pm or on week-ends. 4. You should follow a light diet the first few days after arrival home, such as soup and crackers, pudding, etc.unless your doctor has advised otherwise. A high-fiber, low fat diet can be resumed as tolerated.   Be sure to include lots of fluids daily. Most patients will experience some swelling and bruising on the chest and neck area.  Ice packs will help.  Swelling and bruising can take several days to resolve 5. Most patients will experience some swelling and bruising in the area of the incision. Ice pack will help. Swelling and bruising can take several days to resolve..  6. It is common to experience some constipation if taking pain medication after surgery.  Increasing fluid intake and taking a stool softener will usually help or prevent this problem from occurring.  A mild laxative (Milk of Magnesia or Miralax) should be taken according to package directions if there are no bowel movements after 48 hours. 7. ACTIVITIES:  You may resume regular (light) daily activities beginning the next day--such as daily self-care, walking, climbing stairs--gradually increasing activities as tolerated.  You may have sexual intercourse when it is comfortable.  Refrain from any heavy lifting or straining until approved by your doctor. a. You may drive when you no longer are taking prescription pain medication, you can comfortably wear a seatbelt, and you can safely maneuver your car and apply brakes 8. You should see your doctor in the office for a follow-up appointment  approximately two weeks after your surgery.  Make sure that you call for this appointment within a day or two after you arrive home to insure a convenient appointment time.   WHEN TO CALL YOUR DOCTOR: 1. Fever over 101.0 2. Inability to urinate 3. Nausea and/or vomiting 4. Extreme swelling or bruising 5. Continued bleeding  from incision. 6. Increased pain, redness, or drainage from the incision. 7. Difficulty swallowing or breathing 8. Muscle cramping or spasms. 9. Numbness or tingling in hands or feet or around lips.  The clinic staff is available to answer your questions during regular business hours.  Please dont hesitate to call and ask to speak to one of the nurses if you have concerns.  For further questions, please visit [email or web address]   Soft-Food Meal Plan A soft-food meal plan includes foods that are safe and easy to swallow. This meal plan typically is used:  If you are having trouble chewing or swallowing foods.  As a transition meal plan after only having had liquid meals for a long period. What do I need to know about the soft-food meal plan? A soft-food meal plan includes tender foods that are soft and easy to chew and swallow. In most cases, bite-sized pieces of food are easier to swallow. A bite-sized piece is about  inch or smaller. Foods in this plan do not need to be ground or pureed. Foods that are very hard, crunchy, or sticky should be avoided. Also, breads, cereals, yogurts, and desserts with nuts, seeds, or fruits should be avoided. What foods can I eat? Grains  Rice and wild rice. Moist bread, dressing, pasta, and noodles. Well-moistened dry or cooked cereals, such as farina (cooked wheat cereal), oatmeal, or grits. Biscuits, breads, muffins, pancakes, and waffles that have been well moistened. Vegetables  Shredded lettuce. Cooked, tender vegetables, including potatoes without skins. Vegetable juices. Broths or creamed soups made with vegetables that are not stringy or chewy. Strained tomatoes (without seeds). Fruits  Canned or well-cooked fruits. Soft (ripe), peeled fresh fruits, such as peaches, nectarines, kiwi, cantaloupe, honeydew melon, and watermelon (without seeds). Soft berries with small seeds, such as strawberries. Fruit juices (without pulp). Meats and  Other Protein Sources  Moist, tender, lean beef. Mutton. Lamb. Veal. Chicken. [place]. Liver. Ham. Fish without bones. Eggs. Dairy  Milk, milk drinks, and cream. Plain cream cheese and cottage cheese. Plain yogurt. Sweets/Desserts  Flavored gelatin desserts. Custard. Plain ice cream, frozen yogurt, sherbet, milk shakes, and malts. Plain cakes and cookies. Plain hard candy. Other  Butter, margarine (without trans fat), and cooking oils. Mayonnaise. Cream sauces. Mild spices, salt, and sugar. Syrup, molasses, honey, and jelly. The items listed above may not be a complete list of recommended foods or beverages. Contact your dietitian for more options.  What foods are not recommended? Grains  Dry bread, toast, crackers that have not been moistened. Coarse or dry cereals, such as bran, granola, and shredded wheat. Tough or chewy crusty breads, such as [place] bread or baguettes. Vegetables  Corn. Raw vegetables except shredded lettuce. Cooked vegetables that are tough or stringy. Tough, crisp, fried potatoes and potato skins. Fruits  Fresh fruits with skins or seeds or both, such as apples, pears, or grapes. Stringy, high-pulp fruits, such as papaya, pineapple, coconut, or mango. Fruit leather, fruit roll-ups, and all dried fruits. Meats and Other Protein Sources  Sausages and hot dogs. Meats with gristle. Fish with bones. Nuts, seeds, and chunky peanut or other nut butters. Sweets/Desserts  Cakes or cookies  that are very dry or chewy. The items listed above may not be a complete list of foods and beverages to avoid. Contact your dietitian for more information.  This information is not intended to replace advice given to you by your health care provider. Make sure you discuss any questions you have with your health care provider. Document Released: 10/22/2007 Document Revised: 12/21/2015 Document Reviewed: 06/11/2013 Elsevier Interactive Patient Education  2017 [employer].

## 2016-07-26 NOTE — Progress Notes (Signed)
Physical Therapy Treatment Patient Details Name: Albert Richards MRN: [id number] DOB: Nov 18, 1940 Today's Date: 07/26/2016    History of Present Illness The patient is a 75 year old white male who has a history of metastatic pancreatic cancer for the last 3 years, adm with severe abd pain;  s/p EXPLORATORY LAPAROTOMY  with segmental small bowel resection on 07/11/16;  12/20 EXPLORATORY LAPAROTOMY, EVACUATION OF HEMATOMA (N/A)    PT Comments    The patient is progressing well. Practiced steps and did well. Sats 94% on RA after practice of the steps. Plans DC today.  Follow Up Recommendations  Home health PT     Equipment Recommendations  None recommended by PT    Recommendations for Other Services       Precautions / Restrictions Precautions Precautions: Fall Precaution Comments: abd incision/wound,, diarrhea    Mobility  Bed Mobility   Bed Mobility: Rolling;Sidelying to Sit Rolling: Supervision Sidelying to sit: Supervision       General bed mobility comments: use of rails and HOB raised  Transfers   Equipment used: Rolling walker (2 wheeled) Transfers: Sit to/from Stand Sit to Stand: Supervision Stand pivot transfers: Supervision       General transfer comment: able to stand from [facility] and bed without assist.  Ambulation/Gait Ambulation/Gait assistance: Min guard Ambulation Distance (Feet): 40 Feet Assistive device: Rolling walker (2 wheeled) Gait Pattern/deviations: Step-through pattern         Stairs Stairs: Yes   Stair Management: One rail Left;Step to pattern;Sideways;Forwards Number of Stairs: 4 General stair comments: wife present, cues for sequence and safety.  Wheelchair Mobility    Modified Rankin (Stroke Patients Only)       Balance                                    Cognition Arousal/Alertness: Awake/alert                          Exercises      General Comments        Pertinent Vitals/Pain  Pain Assessment: No/denies pain    Home Living                      Prior Function            PT Goals (current goals can now be found in the care plan section) Progress towards PT goals: Progressing toward goals    Frequency    Min 3X/week      PT Plan Current plan remains appropriate    Co-evaluation             End of Session Equipment Utilized During Treatment: Gait belt Activity Tolerance: Patient tolerated treatment well Patient left: in chair;with call bell/phone within reach;with family/visitor present     Time: 1350-1410 PT Time Calculation (min) (ACUTE ONLY): 20 min  Charges:  $Gait Training: 8-22 mins                    G Codes:      [name] 07/26/2016, 2:15 PM [name] PT [phone]

## 2016-07-26 NOTE — Progress Notes (Signed)
Instructed and assisted wife with abdominal dressing change.  Restocked dressing change supplies.

## 2016-07-26 NOTE — Progress Notes (Signed)
RN reviewed discharge instructions with patient and family. All questions answered.  Paperwork and prescriptions given to wife.   Patient discharged with all belongings to family car.

## 2016-07-30 NOTE — Discharge Summary (Signed)
[facility] Discharge Summary   Patient ID: Albert Richards MRN: [id number] DOB/AGE: [birth year] 77 y.o.  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 Small bowel perforation s/p SB resection 07/11/2016 07/11/2016  . Nausea vomiting and diarrhea   . SBO (small bowel obstruction) 05/13/2016  . Diabetes mellitus ([place]) 05/13/2016  . Hypothyroid 05/13/2016  . Atherosclerosis of aorta ([place]) 07/07/2013  . Stage IV adenocarcinoma of pancreas ([place]) 06/30/2013  . COPD (chronic obstructive pulmonary disease) ([place]) 06/30/2013    Consultants None  Imaging: CT abdomen pelvis w contrast 07/10/16: Pneumoperitoneum consistent with perforated small bowel obstruction, likely within distal duodenum/proximal jejunum. Associated matted , thickened small bowel LEFT abdomen highly concerning for neoplastic involvement. 15 mm small bowel intramural nodule LEFT abdomen versus focal collection. New small to moderate amount of ascites. Nodules within included lung bases consistent with metastatic disease. Increasing conspicuity of lytic lesion in the thoracic spine concerning for osseous metastasis.  Procedures Dr. Marlou Starks (07/11/16) - EXPLORATORY LAPAROTOMY, SEGMENTAL SMALL BOWEL RESECTION Dr. Kieth Brightly (07/17/16) - Reopening of recent laparotomy, evacuation of hematoma   Hospital Course:  Albert Richards is a 76yo male PMH metastatic pancreatic adenocarcinoma who [facility] 07[facility]10[facility]16 with worsening abdominal pain. Patient was admitted in October 2017 for a SBO.  Workup included a CT scan which showed some free air secondary to perforated small bowel obstruction and evidence of worsening metastatic disease.  Due to concern for worsening sepsis patient was admitted and underwent procedure listed above.     He did have an ileus postoperatively as well as possible HCAP pneumonia for which he was treated with zosyn and vancomycin. On POD6 his WBC was trending up therefore a repeat CT scan was done showing concern for anastomotic leak. Patient was taken back to the OR for exploration; fortunately no leak was found but a large hematoma was found and evacuated; 2 drains were left. Due to prolonged ileus he was started on TPN POD8. Ileus resolved and diet was advanced as tolerated. Patient worked with therapies during admission.   Drains were removed. Home health was consulted for PT and wound care. On 07/26/16 the patient was voiding well, tolerating diet, ambulating well, pain well controlled, vital signs stable, incisions c/d/i and felt stable for discharge home.  Patient will follow up with Dr. Marlou Starks in 2 weeks and knows to call with questions or concerns.  He completed a course of zosyn/vancomycin in the hospital and will not need to be on antibiotics at home.  Physical Exam: Gen: Alert, NAD, cooperative Pulm: CTAB, no W/R/R, effort normal Abd: Soft, nontender, mild distension, +BS, open midline incision healing well with beefy red tissue and trace serous drainage at base of wound Ext: No erythema, edema, or tenderness  Allergies as of 07/26/2016   No Known Allergies     Medication List    STOP taking these medications   naproxen sodium 220 MG tablet Commonly known as:  ANAPROX     TAKE these medications   feeding supplement (ENSURE ENLIVE) Liqd Take 237 mLs by mouth 3 (three) times daily between meals.   gabapentin 600 MG tablet Commonly known as:  NEURONTIN Take 600 mg by mouth 2 (two) times daily.   glipiZIDE 10 MG 24 hr tablet Commonly known as:  GLUCOTROL XL Take 10 mg by mouth daily with breakfast.  HYDROcodone-acetaminophen 5-325 MG tablet Commonly known as:  NORCO/VICODIN Take 1-2 tablets every 4-6 hours as needed for pain What changed:  how much to take  how to  take this  when to take this  reasons to take this  additional instructions   levothyroxine 175 MCG tablet Commonly known as:  SYNTHROID, LEVOTHROID Take 175 mcg by mouth every other day.   levothyroxine 150 MCG tablet Commonly known as:  SYNTHROID, LEVOTHROID Take 150 mcg by mouth every other day.   LORazepam 1 MG tablet Commonly known as:  ATIVAN Take 1 mg by mouth every 3 (three) hours.   methocarbamol 750 MG tablet Commonly known as:  ROBAXIN Take 1 tablet (750 mg total) by mouth every 8 (eight) hours as needed for muscle spasms.   morphine 15 MG tablet Commonly known as:  MSIR Take 15 mg by mouth every 6 (six) hours as needed for severe pain.   nitroGLYCERIN 0.4 MG SL tablet Commonly known as:  NITROSTAT Place 0.4 mg under the tongue as needed for chest pain.   omeprazole 20 MG capsule Commonly known as:  PRILOSEC Take 20 mg by mouth every morning.   ondansetron 4 MG disintegrating tablet Commonly known as:  ZOFRAN-ODT Take 1 tablet (4 mg total) by mouth every 6 (six) hours as needed for nausea.   tamsulosin 0.4 MG Caps capsule Commonly known as:  FLOMAX Take 0.4 mg by mouth every morning.        Follow-up [facility] Follow up.   Why:  rolling walker and 3n1 Contact information: [place] [address] [place] Follow up.   Why:  home health physical therapy and nurse for dressing management Contact information: [place] [address] [phone]        Pieter Partridge, MD. Call in 2 week(s).   Specialty:  Oncology Contact information: [place] [address] [phone]        Merrie Roof, MD. Go on 08/09/2016.   Specialty:  General Surgery Why:  Your appointment is 08/09/2016 at 10am. Please arrive 30 minutes prior to your appointment to check in about fill out necessary paperwork. Bring picture ID  and insurance card. Contact information: [address]  [place] [address] [phone]           Signed: Jerrye Beavers, [facility] Surgery 07/30/2016, 9:55 AM Pager: [phone] Consults: [phone] Mon-Fri 7:00 am-4:30 pm Sat-Sun 7:00 am-11:30 am  Agree with above.  Alphonsa Overall, MD, [facility] Surgery Pager: [phone] Office phone:  [phone]

## 2018-01-04 ENCOUNTER — Other Ambulatory Visit: Payer: Self-pay

## 2018-01-04 ENCOUNTER — Inpatient Hospital Stay (HOSPITAL_COMMUNITY)
Admission: EM | Admit: 2018-01-04 | Discharge: 2018-01-09 | DRG: 392 | Disposition: A | Discharge status: Home / Self Care | Payer: Medicare Other | Attending: Internal Medicine | Admitting: Internal Medicine

## 2018-01-04 ENCOUNTER — Encounter (HOSPITAL_COMMUNITY): Payer: Self-pay | Admitting: *Deleted

## 2018-01-04 DIAGNOSIS — K2980 Duodenitis without bleeding: Secondary | ICD-10-CM | POA: Diagnosis present

## 2018-01-04 DIAGNOSIS — Z79899 Other long term (current) drug therapy: Secondary | ICD-10-CM

## 2018-01-04 DIAGNOSIS — K631 Perforation of intestine (nontraumatic): Secondary | ICD-10-CM | POA: Diagnosis present

## 2018-01-04 DIAGNOSIS — K210 Gastro-esophageal reflux disease with esophagitis: Principal | ICD-10-CM | POA: Diagnosis present

## 2018-01-04 DIAGNOSIS — Z7984 Long term (current) use of oral hypoglycemic drugs: Secondary | ICD-10-CM

## 2018-01-04 DIAGNOSIS — E860 Dehydration: Secondary | ICD-10-CM | POA: Diagnosis present

## 2018-01-04 DIAGNOSIS — J449 Chronic obstructive pulmonary disease, unspecified: Secondary | ICD-10-CM | POA: Diagnosis present

## 2018-01-04 DIAGNOSIS — K269 Duodenal ulcer, unspecified as acute or chronic, without hemorrhage or perforation: Secondary | ICD-10-CM | POA: Diagnosis present

## 2018-01-04 DIAGNOSIS — D6481 Anemia due to antineoplastic chemotherapy: Secondary | ICD-10-CM | POA: Diagnosis present

## 2018-01-04 DIAGNOSIS — K2960 Other gastritis without bleeding: Secondary | ICD-10-CM | POA: Diagnosis present

## 2018-01-04 DIAGNOSIS — E876 Hypokalemia: Secondary | ICD-10-CM | POA: Diagnosis not present

## 2018-01-04 DIAGNOSIS — E119 Type 2 diabetes mellitus without complications: Secondary | ICD-10-CM | POA: Diagnosis not present

## 2018-01-04 DIAGNOSIS — K2210 Ulcer of esophagus without bleeding: Secondary | ICD-10-CM | POA: Diagnosis present

## 2018-01-04 DIAGNOSIS — R109 Unspecified abdominal pain: Secondary | ICD-10-CM | POA: Diagnosis not present

## 2018-01-04 DIAGNOSIS — D6959 Other secondary thrombocytopenia: Secondary | ICD-10-CM | POA: Diagnosis present

## 2018-01-04 DIAGNOSIS — C259 Malignant neoplasm of pancreas, unspecified: Secondary | ICD-10-CM | POA: Diagnosis not present

## 2018-01-04 DIAGNOSIS — E1143 Type 2 diabetes mellitus with diabetic autonomic (poly)neuropathy: Secondary | ICD-10-CM | POA: Diagnosis present

## 2018-01-04 DIAGNOSIS — D72829 Elevated white blood cell count, unspecified: Secondary | ICD-10-CM | POA: Diagnosis not present

## 2018-01-04 DIAGNOSIS — N400 Enlarged prostate without lower urinary tract symptoms: Secondary | ICD-10-CM | POA: Diagnosis present

## 2018-01-04 DIAGNOSIS — C7800 Secondary malignant neoplasm of unspecified lung: Secondary | ICD-10-CM | POA: Diagnosis present

## 2018-01-04 DIAGNOSIS — Z66 Do not resuscitate: Secondary | ICD-10-CM | POA: Diagnosis present

## 2018-01-04 DIAGNOSIS — E1165 Type 2 diabetes mellitus with hyperglycemia: Secondary | ICD-10-CM | POA: Diagnosis present

## 2018-01-04 DIAGNOSIS — E871 Hypo-osmolality and hyponatremia: Secondary | ICD-10-CM | POA: Diagnosis present

## 2018-01-04 DIAGNOSIS — K209 Esophagitis, unspecified without bleeding: Secondary | ICD-10-CM

## 2018-01-04 DIAGNOSIS — T451X5A Adverse effect of antineoplastic and immunosuppressive drugs, initial encounter: Secondary | ICD-10-CM | POA: Diagnosis present

## 2018-01-04 DIAGNOSIS — Z87891 Personal history of nicotine dependence: Secondary | ICD-10-CM

## 2018-01-04 DIAGNOSIS — Z0189 Encounter for other specified special examinations: Secondary | ICD-10-CM

## 2018-01-04 DIAGNOSIS — C786 Secondary malignant neoplasm of retroperitoneum and peritoneum: Secondary | ICD-10-CM | POA: Diagnosis present

## 2018-01-04 DIAGNOSIS — E039 Hypothyroidism, unspecified: Secondary | ICD-10-CM | POA: Diagnosis present

## 2018-01-04 DIAGNOSIS — R112 Nausea with vomiting, unspecified: Secondary | ICD-10-CM

## 2018-01-04 DIAGNOSIS — R451 Restlessness and agitation: Secondary | ICD-10-CM | POA: Diagnosis not present

## 2018-01-04 DIAGNOSIS — K3184 Gastroparesis: Secondary | ICD-10-CM | POA: Diagnosis present

## 2018-01-04 LAB — CBC WITH DIFFERENTIAL/PLATELET
BASOS ABS: 0.1 10*3/uL (ref 0.0–0.1)
BASOS PCT: 0 %
EOS ABS: 0 10*3/uL (ref 0.0–0.7)
Eosinophils Relative: 0 %
HCT: 32.6 % — ABNORMAL LOW (ref 39.0–52.0)
Hemoglobin: 10.6 g/dL — ABNORMAL LOW (ref 13.0–17.0)
Lymphocytes Relative: 2 %
Lymphs Abs: 1.1 10*3/uL (ref 0.7–4.0)
MCH: 30.5 pg (ref 26.0–34.0)
MCHC: 32.5 g/dL (ref 30.0–36.0)
MCV: 93.7 fL (ref 78.0–100.0)
Monocytes Absolute: 0.7 10*3/uL (ref 0.1–1.0)
Monocytes Relative: 1 %
NEUTROS PCT: 97 %
Neutro Abs: 60 10*3/uL (ref 1.7–7.7)
Platelets: 88 10*3/uL — ABNORMAL LOW (ref 150–400)
RBC: 3.48 MIL/uL — AB (ref 4.22–5.81)
RDW: 19.7 % — ABNORMAL HIGH (ref 11.5–15.5)
WBC: 61.8 10*3/uL — AB (ref 4.0–10.5)

## 2018-01-04 LAB — COMPREHENSIVE METABOLIC PANEL
ALBUMIN: 2.9 g/dL — AB (ref 3.5–5.0)
ALK PHOS: 126 U/L (ref 38–126)
ALT: 29 U/L (ref 17–63)
ANION GAP: 9 (ref 5–15)
AST: 26 U/L (ref 15–41)
BILIRUBIN TOTAL: 0.8 mg/dL (ref 0.3–1.2)
BUN: 25 mg/dL — ABNORMAL HIGH (ref 6–20)
CALCIUM: 8.4 mg/dL — AB (ref 8.9–10.3)
CO2: 27 mmol/L (ref 22–32)
Chloride: 95 mmol/L — ABNORMAL LOW (ref 101–111)
Creatinine, Ser: 0.94 mg/dL (ref 0.61–1.24)
GLUCOSE: 192 mg/dL — AB (ref 65–99)
Potassium: 3.7 mmol/L (ref 3.5–5.1)
Sodium: 131 mmol/L — ABNORMAL LOW (ref 135–145)
TOTAL PROTEIN: 5.8 g/dL — AB (ref 6.5–8.1)

## 2018-01-04 LAB — URINALYSIS, ROUTINE W REFLEX MICROSCOPIC
Bilirubin Urine: NEGATIVE
GLUCOSE, UA: NEGATIVE mg/dL
Hgb urine dipstick: NEGATIVE
KETONES UR: NEGATIVE mg/dL
LEUKOCYTES UA: NEGATIVE
Nitrite: NEGATIVE
PROTEIN: NEGATIVE mg/dL
Specific Gravity, Urine: 1.021 (ref 1.005–1.030)
pH: 6 (ref 5.0–8.0)

## 2018-01-04 MED ORDER — HYDROMORPHONE HCL 1 MG/ML IJ SOLN
0.5000 mg | INTRAMUSCULAR | Status: DC | PRN
  Administered 2018-01-04 – 2018-01-05 (×3): 0.5 mg via INTRAVENOUS
  Filled 2018-01-04 (×2): qty 0.5
  Filled 2018-01-04: qty 1
  Filled 2018-01-04: qty 0.5

## 2018-01-04 MED ORDER — LEVOTHYROXINE SODIUM 75 MCG PO TABS
150.0000 ug | ORAL_TABLET | ORAL | Status: DC
  Filled 2018-01-04: qty 2

## 2018-01-04 MED ORDER — ENSURE ENLIVE PO LIQD
237.0000 mL | Freq: Three times a day (TID) | ORAL | Status: DC
  Administered 2018-01-07 – 2018-01-09 (×4): 237 mL via ORAL

## 2018-01-04 MED ORDER — GABAPENTIN 300 MG PO CAPS
600.0000 mg | ORAL_CAPSULE | Freq: Two times a day (BID) | ORAL | Status: DC
  Filled 2018-01-04: qty 2

## 2018-01-04 MED ORDER — ONDANSETRON HCL 4 MG/2ML IJ SOLN
4.0000 mg | Freq: Four times a day (QID) | INTRAMUSCULAR | Status: DC | PRN
  Administered 2018-01-04 – 2018-01-05 (×2): 4 mg via INTRAVENOUS
  Filled 2018-01-04 (×2): qty 2

## 2018-01-04 MED ORDER — SODIUM CHLORIDE 0.9 % IV SOLN
INTRAVENOUS | Status: DC
  Administered 2018-01-04: 16:00:00 via INTRAVENOUS

## 2018-01-04 MED ORDER — LORAZEPAM 1 MG PO TABS
1.0000 mg | ORAL_TABLET | Freq: Four times a day (QID) | ORAL | Status: DC | PRN
  Filled 2018-01-04: qty 1

## 2018-01-04 MED ORDER — ONDANSETRON HCL 4 MG PO TABS: 4.0000 mg | ORAL_TABLET | Freq: Four times a day (QID) | ORAL | Status: DC | PRN

## 2018-01-04 MED ORDER — PANTOPRAZOLE SODIUM 40 MG PO TBEC
40.0000 mg | DELAYED_RELEASE_TABLET | Freq: Every day | ORAL | Status: DC
  Administered 2018-01-07 – 2018-01-08 (×2): 40 mg via ORAL
  Filled 2018-01-04 (×4): qty 1

## 2018-01-04 MED ORDER — SODIUM CHLORIDE 0.9 % IV SOLN
INTRAVENOUS | Status: DC
  Administered 2018-01-04 – 2018-01-09 (×6): via INTRAVENOUS

## 2018-01-04 MED ORDER — TAMSULOSIN HCL 0.4 MG PO CAPS
0.4000 mg | ORAL_CAPSULE | ORAL | Status: DC
  Administered 2018-01-07 – 2018-01-09 (×3): 0.4 mg via ORAL
  Filled 2018-01-04 (×5): qty 1

## 2018-01-04 MED ORDER — INSULIN ASPART 100 UNIT/ML ~~LOC~~ SOLN
0.0000 [IU] | Freq: Three times a day (TID) | SUBCUTANEOUS | Status: DC
  Administered 2018-01-05: 2 [IU] via SUBCUTANEOUS
  Administered 2018-01-05 – 2018-01-06 (×4): 3 [IU] via SUBCUTANEOUS

## 2018-01-04 MED ORDER — HYDROCODONE-ACETAMINOPHEN 5-325 MG PO TABS: 1.0000 | ORAL_TABLET | ORAL | Status: DC | PRN

## 2018-01-04 MED ORDER — PROMETHAZINE HCL 25 MG/ML IJ SOLN
12.5000 mg | Freq: Once | INTRAMUSCULAR | Status: AC
  Administered 2018-01-04: 12.5 mg via INTRAVENOUS
  Filled 2018-01-04: qty 1

## 2018-01-04 MED ORDER — ONDANSETRON HCL 4 MG/2ML IJ SOLN
4.0000 mg | Freq: Once | INTRAMUSCULAR | Status: AC
  Administered 2018-01-04: 4 mg via INTRAVENOUS
  Filled 2018-01-04: qty 2

## 2018-01-04 MED ORDER — SODIUM CHLORIDE 0.9 % IV BOLUS
1000.0000 mL | Freq: Once | INTRAVENOUS | Status: AC
  Administered 2018-01-04: 1000 mL via INTRAVENOUS

## 2018-01-04 NOTE — ED Triage Notes (Signed)
Pt c/o nausea, diarrhea, loss of appetite, dry heaving 2 days ago. Pt had chemo on Tuesday. Pt saw oncologist on Thursday and was given fluids, nausea meds, and steroids without any improvement. Pt has pancreatic cancer.

## 2018-01-04 NOTE — ED Notes (Signed)
[facility] called and patients transfer cancelled per family request.

## 2018-01-04 NOTE — ED Notes (Signed)
Date and time results received: 01/04/18 @13 :19   Test: WBC  Critical Value: 61.8  Name of Provider Notified: Dr Sabra Heck  Orders Received? Or Actions Taken?: no additional orders given,

## 2018-01-04 NOTE — ED Provider Notes (Signed)
[facility] EMERGENCY DEPARTMENT Provider Note   CSN: [id number] Arrival date & time: 01/04/18  1038     History   Chief Complaint Chief Complaint  Patient presents with  . Emesis  . Weakness    HPI Albert Richards is a 77 y.o. male.  HPI  The patient is a 77 year old male, known history of COPD, diabetes but also has a history of pancreatic cancer with metastatic disease with spread to the lungs as well as the peritoneum, urine a half ago he had spread to the small bowel and required a resection after perforation.  He is done very well with regards to the bowel surgery but continues to get chemotherapy due to the ongoing pancreatic cancer presents.  He most recently had his last dose of IV medications on Tuesday, this made him extremely nauseated and he has been feeling poorly since that time.  On Thursday, 3 days ago, he presented to the office for IV fluids, nausea medications and received a steroid shot at that time as well.  This gave him some temporary relief but his symptoms have been persistent, gradually worsening and are now associated with some watery diarrhea every time he tries to drink fluids.  He is feeling more and more dehydrated.  He denies blood in the stools, denies dysuria, has generalized weakness.  Past Medical History:  Diagnosis Date  . Arthritis   . Bowel obstruction ([place])   . 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 Diabetes mellitus without complication ([employer])   . History of kidney stones 10-12 yrs ago  . Hypothyroidism   . pancreatic ca dx'd 2014  . Pneumonia   . Rocky Mountain spotted fever     Patient Active Problem List   Diagnosis Date Noted  . Small bowel perforation s/p SB resection 07/11/2016 07/11/2016  . Nausea vomiting and diarrhea   . SBO (small bowel obstruction) ([place]) 05/13/2016  . Diabetes mellitus ([place]) 05/13/2016  . Hypothyroid 05/13/2016  . Atherosclerosis of aorta ([place]) 07/07/2013  . Stage IV  adenocarcinoma of pancreas ([place]) 06/30/2013  . COPD (chronic obstructive pulmonary disease) ([place]) 06/30/2013    Past Surgical History:  Procedure Laterality Date  . AMPUTATION Left 01/08/2016   Procedure: AMPUTATION REVISION LEFT FIRST FINGER ;  Surgeon: Charlotte Crumb, MD;  Location: [place];  Service: Orthopedics;  Laterality: Left;  . EUS N/A 07/08/2013   Procedure: UPPER ENDOSCOPIC ULTRASOUND (EUS) LINEAR;  Surgeon: Milus Banister, MD;  [facility];  Service: Endoscopy;  Laterality: N/A;  . I&D EXTREMITY Left 01/08/2016   Procedure: IRRIGATION AND DEBRIDEMENT LEFT THUMB;  Surgeon: Charlotte Crumb, MD;  Location: [place];  Service: Orthopedics;  Laterality: Left;  . LAPAROTOMY N/A 07/11/2016   Procedure: EXPLORATORY LAPAROTOMY;  Surgeon: Autumn Messing III, MD;  [facility];  Service: General;  Laterality: N/A;  . LAPAROTOMY N/A 07/17/2016   Procedure: EXPLORATORY LAPAROTOMY, EVACUATION OF HEMATOMA;  Surgeon: Mickeal Skinner, MD;  [facility];  Service: General;  Laterality: N/A;  . PORTACATH PLACEMENT N/A 07/30/2013   Procedure: INSERTION PORT-A-CATH;  Surgeon: Stark Klein, MD;  Location: [place];  Service: General;  Laterality: N/A;  . TONSILLECTOMY  as child        Home Medications    Prior to Admission medications   Medication Sig Start Date End Date Taking? Authorizing Provider  feeding supplement, ENSURE ENLIVE, (ENSURE ENLIVE) LIQD Take 237 mLs by mouth 3 (three) times daily between meals. 07/26/16  Meuth, Brooke A, PA-C  gabapentin (NEURONTIN) 600 MG tablet Take 600 mg by mouth 2 (two) times daily. 01/10/16   [provider]  glipiZIDE (GLUCOTROL XL) 10 MG 24 hr tablet Take 10 mg by mouth daily with breakfast.  11/07/15   [provider]  HYDROcodone-acetaminophen (NORCO/VICODIN) 5-325 MG tablet Take 1-2 tablets every 4-6 hours as needed for pain Patient taking differently: Take 1-2 tablets by mouth every 4 (four) hours as needed for  moderate pain or severe pain.  05/01/16   Ladell Pier, MD  levothyroxine (SYNTHROID, LEVOTHROID) 150 MCG tablet Take 150 mcg by mouth every other day.    [provider]  levothyroxine (SYNTHROID, LEVOTHROID) 175 MCG tablet Take 175 mcg by mouth every other day.     [provider]  LORazepam (ATIVAN) 1 MG tablet Take 1 mg by mouth every 3 (three) hours.    [provider]  methocarbamol (ROBAXIN) 750 MG tablet Take 1 tablet (750 mg total) by mouth every 8 (eight) hours as needed for muscle spasms. 07/26/16   Meuth, Brooke A, PA-C  morphine (MSIR) 15 MG tablet Take 15 mg by mouth every 6 (six) hours as needed for severe pain.    [provider]  nitroGLYCERIN (NITROSTAT) 0.4 MG SL tablet Place 0.4 mg under the tongue as needed for chest pain.  03/09/15   [provider]  omeprazole (PRILOSEC) 20 MG capsule Take 20 mg by mouth every morning.     [provider]  ondansetron (ZOFRAN-ODT) 4 MG disintegrating tablet Take 1 tablet (4 mg total) by mouth every 6 (six) hours as needed for nausea. 07/26/16   Meuth, Blaine Hamper, PA-C  Tamsulosin HCl (FLOMAX) 0.4 MG CAPS Take 0.4 mg by mouth every morning.    [provider]    Family History No family history on file.  Social History Social History   Tobacco Use  . Smoking status: Former Smoker    Packs/day: 1.00    Years: 50.00    Pack years: 50.00    Types: Cigarettes    Start date: 07/30/1955    Last attempt to quit: 07/29/2005    Years since quitting: 12.4  . Smokeless tobacco: Never Used  Substance Use Topics  . Alcohol use: No  . Drug use: No     Allergies   Patient has no known allergies.   Review of Systems Review of Systems  All other systems reviewed and are negative.    Physical Exam Updated Vital Signs BP 125/72   Pulse 69   Temp 98.8 F (37.1 C) (Oral)   Resp 18   Ht 5\' 10"  (1.778 m)   Wt 88.5 kg (195 lb)   SpO2 94%   BMI 27.98 kg/m   Physical Exam    Constitutional: He appears well-developed and well-nourished. No distress.  HENT:  Head: Normocephalic and atraumatic.  Mouth/Throat: No oropharyngeal exudate.  MMslightly dehydrated  Eyes: Pupils are equal, round, and reactive to light. Conjunctivae and EOM are normal. Right eye exhibits no discharge. Left eye exhibits no discharge. No scleral icterus.  Neck: Normal range of motion. Neck supple. No JVD present. No thyromegaly present.  Cardiovascular: Regular rhythm, normal heart sounds and intact distal pulses. Exam reveals no gallop and no friction rub.  No murmur heard. tachycardia  Pulmonary/Chest: Effort normal and breath sounds normal. No respiratory distress. He has no wheezes. He has no rales.  Abdominal: Soft. Bowel sounds are normal. He exhibits no distension and  no mass. There is no tenderness ( mild mid abd ttp - no guarding, normal BS).  Musculoskeletal: Normal range of motion. He exhibits no edema or tenderness.  Lymphadenopathy:    He has no cervical adenopathy.  Neurological: He is alert. Coordination normal.  Skin: Skin is warm and dry. No rash noted. No erythema.  Psychiatric: He has a normal mood and affect. His behavior is normal.  Nursing note and vitals reviewed.    ED Treatments / Results  Labs (all labs ordered are listed, but only abnormal results are displayed) Labs Reviewed  CBC WITH DIFFERENTIAL/PLATELET - Abnormal; Notable for the following components:      Result Value   WBC 61.8 (*)    RBC 3.48 (*)    Hemoglobin 10.6 (*)    HCT 32.6 (*)    RDW 19.7 (*)    Platelets 88 (*)    All other components within normal limits  COMPREHENSIVE METABOLIC PANEL - Abnormal; Notable for the following components:   Sodium 131 (*)    Chloride 95 (*)    Glucose, Bld 192 (*)    BUN 25 (*)    Calcium 8.4 (*)    Total Protein 5.8 (*)    Albumin 2.9 (*)    All other components within normal limits  URINALYSIS, ROUTINE W REFLEX MICROSCOPIC    EKG EKG  Interpretation  Date/Time:  Sunday January 04 2018 10:52:54 EDT Ventricular Rate:  114 PR Interval:  156 QRS Duration: 96 QT Interval:  328 QTC Calculation: 452 R Axis:   55 Text Interpretation:  Sinus tachycardia Right atrial enlargement ST & T wave abnormality, consider inferolateral ischemia Abnormal ECG No old tracing to compare Confirmed by Noemi Chapel [phone]) on 01/04/2018 12:14:35 PM   Radiology No results found.  Procedures Procedures (including critical care time)  Medications Ordered in ED Medications  0.9 %  sodium chloride infusion (has no administration in time range)  promethazine (PHENERGAN) injection 12.5 mg (has no administration in time range)  sodium chloride 0.9 % bolus 1,000 mL (0 mLs Intravenous Stopped 01/04/18 1447)  ondansetron (ZOFRAN) injection 4 mg (4 mg Intravenous Given 01/04/18 1232)     Initial Impression / Assessment and Plan / ED Course  I have reviewed the triage vital signs and the nursing notes.  Pertinent labs & imaging results that were available during my care of the patient were reviewed by me and considered in my medical decision making (see chart for details).  Clinical Course as of Jan 04 1738  Nancy Fetter Jan 04, 2018  1512 Review of the medical record shows that the patient did have Neulasta 2 days ago.   [BM]  1525 I discussed the care with Dr. Arvil Chaco (sp?) the patient's on-call oncologist, he has agreed to accept the patient, transfer to [facility], this will be arranged shortly   [BM]  1735 Patient and the family members would rather stay at this hospital and do not want to leave the county.  I will discuss with our admitting doctor upstairs, he has improved somewhat with Phenergan but did not with Zofran   [BM]    Clinical Course User Index [BM] Noemi Chapel, MD   The patient has a port in the left upper chest wall, he does appear to have mild tachycardia, he is persistently ill, this is likely related to chemotherapy.  He does not  have a fever.  Check urinalysis, labs, rule out electrolyte dysfunction or progressive kidney dysfunction, hydrate and treat his  nausea.  He was sent here by Dr. Abran Duke (currently at [facility]) for IV fluids and evaluation -I tried to prove the medical records however most of these are out of system.  There is limited information however it does appear he has had ongoing treatment for his pancreatic cancer.  D/w Dr. Darrick Meigs who agrees to admit patient here.  Final Clinical Impressions(s) / ED Diagnoses   Final diagnoses:  Intractable vomiting with nausea, unspecified vomiting type  Leukocytosis, unspecified type      Noemi Chapel, MD 01/04/18 1740

## 2018-01-04 NOTE — H&P (Signed)
[facility] H&P    Patient Demographics:    Albert Richards, is a 77 y.o. male  MRN: [id number]  DOB - March 10, 1941  Admit Date - 01/04/2018  Referring MD/NP/PA: Dr. Sabra Heck  Outpatient Primary MD for the patient is [name], MD  Patient coming from: Home  Chief complaint-abdominal pain   HPI:    Albert Richards  is a 77 y.o. 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  Patient is currently getting chemotherapy for pancreatic cancer, last chemo was on Tuesday after that patient became nauseated and had abdominal pain.  He also received injection of Neulasta 2 days ago.  Patient was seen by oncologist 3 days ago for nausea and was given IV fluids along with steroid shot.  This gave him temporary relief but symptoms have returned.  He denies diarrhea.  Complains of retching but no vomiting complains of abdominal pain. He denies dysuria, urgency or frequency of urination. Denies chest pain or shortness of breath. In the ED, lab work showed WBC 61,000, patient is not septic.  Does not have any source of infection.      Review of systems:     All other systems reviewed and are negative.   With Past History of the following :    Past Medical History:  Diagnosis Date  . Arthritis   . Bowel obstruction ([place])   . 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 Diabetes mellitus without complication ([place])   . History of kidney stones 10-12 yrs ago  . Hypothyroidism   . pancreatic ca dx'd 2014  . Pneumonia   . Rocky Mountain spotted fever       Past Surgical History:  Procedure Laterality Date  . AMPUTATION Left 01/08/2016   Procedure: AMPUTATION REVISION LEFT FIRST FINGER ;  Surgeon: Charlotte Crumb, MD;  Location: [place];  Service: Orthopedics;  Laterality: Left;  . EUS N/A  07/08/2013   Procedure: UPPER ENDOSCOPIC ULTRASOUND (EUS) LINEAR;  Surgeon: Milus Banister, MD;  [facility];  Service: Endoscopy;  Laterality: N/A;  . I&D EXTREMITY Left 01/08/2016   Procedure: IRRIGATION AND DEBRIDEMENT LEFT THUMB;  Surgeon: Charlotte Crumb, MD;  Location: [place];  Service: Orthopedics;  Laterality: Left;  . LAPAROTOMY N/A 07/11/2016   Procedure: EXPLORATORY LAPAROTOMY;  Surgeon: Autumn Messing III, MD;  [facility];  Service: General;  Laterality: N/A;  . LAPAROTOMY N/A 07/17/2016   Procedure: EXPLORATORY LAPAROTOMY, EVACUATION OF HEMATOMA;  Surgeon: Mickeal Skinner, MD;  [facility];  Service: General;  Laterality: N/A;  . PORTACATH PLACEMENT N/A 07/30/2013   Procedure: INSERTION PORT-A-CATH;  Surgeon: Stark Klein, MD;  Location: [place];  Service: General;  Laterality: N/A;  . TONSILLECTOMY  as child      Social History:      Social History   Tobacco Use  . Smoking status: Former Smoker    Packs/day: 1.00    Years: 50.00    Pack  years: 50.00    Types: Cigarettes    Start date: 07/30/1955    Last attempt to quit: 07/29/2005    Years since quitting: 12.4  . Smokeless tobacco: Never Used  Substance Use Topics  . Alcohol use: No       Family History :   Patient's grandmother had stomach cancer   Home Medications:   Prior to Admission medications   Medication Sig Start Date End Date Taking? Authorizing Provider  feeding supplement, ENSURE ENLIVE, (ENSURE ENLIVE) LIQD Take 237 mLs by mouth 3 (three) times daily between meals. 07/26/16   Meuth, Brooke A, PA-C  gabapentin (NEURONTIN) 600 MG tablet Take 600 mg by mouth 2 (two) times daily. 01/10/16   [provider]  glipiZIDE (GLUCOTROL XL) 10 MG 24 hr tablet Take 10 mg by mouth daily with breakfast.  11/07/15   [provider]  HYDROcodone-acetaminophen (NORCO/VICODIN) 5-325 MG tablet Take 1-2 tablets every 4-6 hours as needed for pain Patient taking differently: Take 1-2  tablets by mouth every 4 (four) hours as needed for moderate pain or severe pain.  05/01/16   Ladell Pier, MD  levothyroxine (SYNTHROID, LEVOTHROID) 150 MCG tablet Take 150 mcg by mouth every other day.    [provider]  levothyroxine (SYNTHROID, LEVOTHROID) 175 MCG tablet Take 175 mcg by mouth every other day.     [provider]  LORazepam (ATIVAN) 1 MG tablet Take 1 mg by mouth every 3 (three) hours.    [provider]  methocarbamol (ROBAXIN) 750 MG tablet Take 1 tablet (750 mg total) by mouth every 8 (eight) hours as needed for muscle spasms. 07/26/16   Meuth, Brooke A, PA-C  morphine (MSIR) 15 MG tablet Take 15 mg by mouth every 6 (six) hours as needed for severe pain.    [provider]  nitroGLYCERIN (NITROSTAT) 0.4 MG SL tablet Place 0.4 mg under the tongue as needed for chest pain.  03/09/15   [provider]  omeprazole (PRILOSEC) 20 MG capsule Take 20 mg by mouth every morning.     [provider]  ondansetron (ZOFRAN-ODT) 4 MG disintegrating tablet Take 1 tablet (4 mg total) by mouth every 6 (six) hours as needed for nausea. 07/26/16   Meuth, Blaine Hamper, PA-C  Tamsulosin HCl (FLOMAX) 0.4 MG CAPS Take 0.4 mg by mouth every morning.    [provider]     Allergies:    No Known Allergies   Physical Exam:   Vitals  Blood pressure 129/81, pulse 89, temperature 98.8 F (37.1 C), temperature source Oral, resp. rate 18, height 5\' 10"  (1.778 m), weight 88.5 kg (195 lb), SpO2 97 %.  1.  General: Appears in no acute distress  2. Psychiatric:  Intact judgement and  insight, awake alert, oriented x 3.  3. Neurologic: No focal neurological deficits, all cranial nerves intact.Strength 5/5 all 4 extremities, sensation intact all 4 extremities, plantars down going.  4. Eyes :  anicteric sclerae, moist conjunctivae with no lid lag. PERRLA.  5. ENMT:  Oropharynx clear with moist mucous membranes and good dentition  6.  Neck:  supple, no cervical lymphadenopathy appriciated, No thyromegaly  7. Respiratory : Normal respiratory effort, good air movement bilaterally,clear to  auscultation bilaterally  8. Cardiovascular : RRR, no gallops, rubs or murmurs, no leg edema  9. Gastrointestinal:  Positive bowel sounds, abdomen soft, non-tender to palpation,no hepatosplenomegaly, no rigidity or guarding       10. Skin:  No cyanosis,  normal texture and turgor, no rash, lesions or ulcers  11.Musculoskeletal:  Good muscle tone,  joints appear normal , no effusions,  normal range of motion    Data Review:    CBC Recent Labs  Lab 01/04/18 1225  WBC 61.8*  HGB 10.6*  HCT 32.6*  PLT 88*  MCV 93.7  MCH 30.5  MCHC 32.5  RDW 19.7*  LYMPHSABS 1.1  MONOABS 0.7  EOSABS 0.0  BASOSABS 0.1   ------------------------------------------------------------------------------------------------------------------  Chemistries  Recent Labs  Lab 01/04/18 1225  NA 131*  K 3.7  CL 95*  CO2 27  GLUCOSE 192*  BUN 25*  CREATININE 0.94  CALCIUM 8.4*  AST 26  ALT 29  ALKPHOS 126  BILITOT 0.8   ------------------------------------------------------------------------------------------------------------------  ------------------------------------------------------------------------------------------------------------------ GFR: Estimated Creatinine Clearance: 73.7 mL/min (by C-G formula based on SCr of 0.94 mg/dL). Liver Function Tests: Recent Labs  Lab 01/04/18 1225  AST 26  ALT 29  ALKPHOS 126  BILITOT 0.8  PROT 5.8*  ALBUMIN 2.9*    --------------------------------------------------------------------------------------------------------------- Urine analysis:    Component Value Date/Time   COLORURINE YELLOW 01/04/2018 1217   APPEARANCEUR CLEAR 01/04/2018 1217   LABSPEC 1.021 01/04/2018 1217   PHURINE 6.0 01/04/2018 [place] 01/04/2018 1217   [place] 01/04/2018 1217    BILIRUBINUR NEGATIVE 01/04/2018 1217   KETONESUR NEGATIVE 01/04/2018 1217   PROTEINUR NEGATIVE 01/04/2018 1217   NITRITE NEGATIVE 01/04/2018 1217   LEUKOCYTESUR NEGATIVE 01/04/2018 1217      Imaging Results:    No results found.  My personal review of EKG: Rhythm -sinus tachycardia    Assessment & Plan:    Active Problems:   Stage IV adenocarcinoma of pancreas (HCC)   COPD (chronic obstructive pulmonary disease) (HCC)   Diabetes mellitus (HCC)   Small bowel perforation s/p SB resection 07/11/2016   Abdominal pain   1. Abdominal pain-patient has had poor p.o. intake since he received last chemotherapy, appears dehydrated.  Will start IV normal saline at 100 mL/h.  Dilaudid 0.5 mg every 4 hours as needed.  Zofran as needed for nausea and vomiting 2. Leukocytosis-patient has significantly elevated WBC 61,000, likely from Neulasta as well as steroid patient received at office of Dr. Abran Duke.  Will repeat CBC in a.m.  Family was offered to transfer to [facility] but they refused transfer.  If patient continues to have significant leukocytosis consider transferring patient to [facility] in a.m. 3. Stage IV adenocarcinoma of pancreas-followed by Dr. Abran Duke as outpatient.  Currently getting IV chemotherapy 4. Diabetes mellitus-hold Glucotrol, will start sliding scale insulin with NovoLog. 5. Thrombocytopenia-likely chemotherapy-induced, follow platelet count in a.m.   DVT Prophylaxis-   SCDs   AM Labs Ordered, also please review Full Orders  Family Communication: Admission, patients condition and plan of care including tests being ordered have been discussed with the patient and his family members at bedside who indicate understanding and agree with the plan and Code Status.  Code Status:   DNR  Admission status:Observation    Time spent in minutes :60 minutes   Oswald Hillock M.D on 01/04/2018 at 9:23 PM  Between 7am to 7pm - Pager - [phone]. After 7pm go to [email or web address]  - password [facility]  [facility] - Office  [phone]

## 2018-01-05 ENCOUNTER — Encounter (HOSPITAL_COMMUNITY): Payer: Self-pay

## 2018-01-05 ENCOUNTER — Other Ambulatory Visit: Payer: Self-pay

## 2018-01-05 ENCOUNTER — Observation Stay (HOSPITAL_COMMUNITY): Payer: Medicare Other

## 2018-01-05 ENCOUNTER — Inpatient Hospital Stay (HOSPITAL_COMMUNITY): Payer: Medicare Other

## 2018-01-05 DIAGNOSIS — K3184 Gastroparesis: Secondary | ICD-10-CM | POA: Diagnosis present

## 2018-01-05 DIAGNOSIS — R112 Nausea with vomiting, unspecified: Secondary | ICD-10-CM | POA: Diagnosis not present

## 2018-01-05 DIAGNOSIS — N400 Enlarged prostate without lower urinary tract symptoms: Secondary | ICD-10-CM | POA: Diagnosis present

## 2018-01-05 DIAGNOSIS — E1143 Type 2 diabetes mellitus with diabetic autonomic (poly)neuropathy: Secondary | ICD-10-CM | POA: Diagnosis present

## 2018-01-05 DIAGNOSIS — E1165 Type 2 diabetes mellitus with hyperglycemia: Secondary | ICD-10-CM | POA: Diagnosis present

## 2018-01-05 DIAGNOSIS — C259 Malignant neoplasm of pancreas, unspecified: Secondary | ICD-10-CM | POA: Diagnosis present

## 2018-01-05 DIAGNOSIS — E860 Dehydration: Secondary | ICD-10-CM | POA: Diagnosis present

## 2018-01-05 DIAGNOSIS — E119 Type 2 diabetes mellitus without complications: Secondary | ICD-10-CM | POA: Diagnosis not present

## 2018-01-05 DIAGNOSIS — Z7984 Long term (current) use of oral hypoglycemic drugs: Secondary | ICD-10-CM | POA: Diagnosis not present

## 2018-01-05 DIAGNOSIS — D6959 Other secondary thrombocytopenia: Secondary | ICD-10-CM | POA: Diagnosis present

## 2018-01-05 DIAGNOSIS — K228 Other specified diseases of esophagus: Secondary | ICD-10-CM | POA: Diagnosis not present

## 2018-01-05 DIAGNOSIS — C786 Secondary malignant neoplasm of retroperitoneum and peritoneum: Secondary | ICD-10-CM | POA: Diagnosis present

## 2018-01-05 DIAGNOSIS — D72829 Elevated white blood cell count, unspecified: Secondary | ICD-10-CM | POA: Diagnosis present

## 2018-01-05 DIAGNOSIS — E871 Hypo-osmolality and hyponatremia: Secondary | ICD-10-CM | POA: Diagnosis present

## 2018-01-05 DIAGNOSIS — T451X5A Adverse effect of antineoplastic and immunosuppressive drugs, initial encounter: Secondary | ICD-10-CM | POA: Diagnosis present

## 2018-01-05 DIAGNOSIS — K269 Duodenal ulcer, unspecified as acute or chronic, without hemorrhage or perforation: Secondary | ICD-10-CM | POA: Diagnosis not present

## 2018-01-05 DIAGNOSIS — Z87891 Personal history of nicotine dependence: Secondary | ICD-10-CM | POA: Diagnosis not present

## 2018-01-05 DIAGNOSIS — Z79899 Other long term (current) drug therapy: Secondary | ICD-10-CM | POA: Diagnosis not present

## 2018-01-05 DIAGNOSIS — E876 Hypokalemia: Secondary | ICD-10-CM | POA: Diagnosis not present

## 2018-01-05 DIAGNOSIS — J449 Chronic obstructive pulmonary disease, unspecified: Secondary | ICD-10-CM | POA: Diagnosis present

## 2018-01-05 DIAGNOSIS — K2210 Ulcer of esophagus without bleeding: Secondary | ICD-10-CM | POA: Diagnosis present

## 2018-01-05 DIAGNOSIS — K2960 Other gastritis without bleeding: Secondary | ICD-10-CM | POA: Diagnosis present

## 2018-01-05 DIAGNOSIS — R109 Unspecified abdominal pain: Secondary | ICD-10-CM | POA: Diagnosis not present

## 2018-01-05 DIAGNOSIS — R933 Abnormal findings on diagnostic imaging of other parts of digestive tract: Secondary | ICD-10-CM | POA: Diagnosis not present

## 2018-01-05 DIAGNOSIS — C7800 Secondary malignant neoplasm of unspecified lung: Secondary | ICD-10-CM | POA: Diagnosis present

## 2018-01-05 DIAGNOSIS — K2980 Duodenitis without bleeding: Secondary | ICD-10-CM | POA: Diagnosis present

## 2018-01-05 DIAGNOSIS — K210 Gastro-esophageal reflux disease with esophagitis: Secondary | ICD-10-CM | POA: Diagnosis present

## 2018-01-05 DIAGNOSIS — R451 Restlessness and agitation: Secondary | ICD-10-CM | POA: Diagnosis not present

## 2018-01-05 DIAGNOSIS — Z66 Do not resuscitate: Secondary | ICD-10-CM | POA: Diagnosis present

## 2018-01-05 DIAGNOSIS — E039 Hypothyroidism, unspecified: Secondary | ICD-10-CM | POA: Diagnosis present

## 2018-01-05 DIAGNOSIS — K209 Esophagitis, unspecified: Secondary | ICD-10-CM | POA: Diagnosis not present

## 2018-01-05 LAB — COMPREHENSIVE METABOLIC PANEL
ALK PHOS: 133 U/L — AB (ref 38–126)
ALT: 25 U/L (ref 17–63)
ANION GAP: 8 (ref 5–15)
AST: 19 U/L (ref 15–41)
Albumin: 2.7 g/dL — ABNORMAL LOW (ref 3.5–5.0)
BUN: 19 mg/dL (ref 6–20)
CALCIUM: 7.9 mg/dL — AB (ref 8.9–10.3)
CO2: 26 mmol/L (ref 22–32)
Chloride: 98 mmol/L — ABNORMAL LOW (ref 101–111)
Creatinine, Ser: 0.89 mg/dL (ref 0.61–1.24)
GFR calc non Af Amer: >60 mL/min (ref >60)
Glucose, Bld: 177 mg/dL — ABNORMAL HIGH (ref 65–99)
POTASSIUM: 3.6 mmol/L (ref 3.5–5.1)
SODIUM: 132 mmol/L — AB (ref 135–145)
TOTAL PROTEIN: 5.3 g/dL — AB (ref 6.5–8.1)
Total Bilirubin: 0.8 mg/dL (ref 0.3–1.2)

## 2018-01-05 LAB — CBC
HCT: 30.9 % — ABNORMAL LOW (ref 39.0–52.0)
HEMOGLOBIN: 10.2 g/dL — AB (ref 13.0–17.0)
MCH: 30.6 pg (ref 26.0–34.0)
MCHC: 32.6 g/dL (ref 30.0–36.0)
MCV: 94 fL (ref 78.0–100.0)
Platelets: 71 10*3/uL — ABNORMAL LOW (ref 150–400)
RBC: 3.33 MIL/uL — ABNORMAL LOW (ref 4.22–5.81)
RDW: 19.9 % — AB (ref 11.5–15.5)
WBC: 51.2 10*3/uL (ref 4.0–10.5)

## 2018-01-05 LAB — GLUCOSE, CAPILLARY
GLUCOSE-CAPILLARY: 209 mg/dL — AB (ref 65–99)
GLUCOSE-CAPILLARY: 204 mg/dL — AB (ref 65–99)
GLUCOSE-CAPILLARY: 154 mg/dL — AB (ref 65–99)
Glucose-Capillary: 182 mg/dL — ABNORMAL HIGH (ref 65–99)

## 2018-01-05 LAB — HEMOGLOBIN A1C
HEMOGLOBIN A1C: 7.7 % — AB (ref 4.8–5.6)
Mean Plasma Glucose: 174.29 mg/dL

## 2018-01-05 MED ORDER — FAMOTIDINE IN NACL 20-0.9 MG/50ML-% IV SOLN
20.0000 mg | Freq: Two times a day (BID) | INTRAVENOUS | Status: DC
  Administered 2018-01-05 – 2018-01-08 (×7): 20 mg via INTRAVENOUS
  Filled 2018-01-05 (×7): qty 50

## 2018-01-05 MED ORDER — IOPAMIDOL (ISOVUE-300) INJECTION 61%
100.0000 mL | Freq: Once | INTRAVENOUS | Status: AC | PRN
  Administered 2018-01-05: 100 mL via INTRAVENOUS

## 2018-01-05 MED ORDER — LEVOTHYROXINE SODIUM 100 MCG IV SOLR
75.0000 ug | Freq: Every day | INTRAVENOUS | Status: DC
  Administered 2018-01-06 – 2018-01-08 (×3): 75 ug via INTRAVENOUS
  Filled 2018-01-05 (×4): qty 5

## 2018-01-05 MED ORDER — PHENOL 1.4 % MT LIQD
1.0000 | OROMUCOSAL | Status: DC | PRN
  Administered 2018-01-05 (×2): 1 via OROMUCOSAL
  Filled 2018-01-05: qty 177

## 2018-01-05 MED ORDER — PIPERACILLIN-TAZOBACTAM 3.375 G IVPB
3.3750 g | Freq: Three times a day (TID) | INTRAVENOUS | Status: DC
  Administered 2018-01-05 – 2018-01-08 (×8): 3.375 g via INTRAVENOUS
  Filled 2018-01-05 (×8): qty 50

## 2018-01-05 MED ORDER — PROMETHAZINE HCL 25 MG/ML IJ SOLN
12.5000 mg | INTRAMUSCULAR | Status: DC | PRN
  Administered 2018-01-05 – 2018-01-06 (×2): 12.5 mg via INTRAVENOUS
  Filled 2018-01-05 (×2): qty 1

## 2018-01-05 NOTE — Progress Notes (Addendum)
GI consult request noted. However, patient is requesting Dr. Laural Golden.   SPOKE TO ONCOLOGY CHEMO RX. LAST RX JUN 6. RECEIVED DECADRON AND NEULASTA ON JUN 6. DR. Tressie Stalker IS ON VACATION. WILL BE BACK NEXT WEEK.

## 2018-01-05 NOTE — Progress Notes (Signed)
PROGRESS NOTE    Albert Richards  ZOX:[id number] DOB: [birth year] DOA: 01/04/2018 PCP: Carylon Perches, MD   Brief Narrative:   Mithcell Richards  is a 77 y.o. 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  Patient is currently getting chemotherapy for pancreatic cancer, last chemo was on Tuesday after that patient became nauseated and had abdominal pain.  He has been started on symptomatic treatment with IV fluid and Zofran with minimal relief. CT scan this morning demonstrates some duodenitis which is likely contributing to the process.   Assessment & Plan:   Active Problems:   Stage IV adenocarcinoma of pancreas (HCC)   COPD (chronic obstructive pulmonary disease) (HCC)   Diabetes mellitus (HCC)   Small bowel perforation s/p SB resection 07/11/2016   Abdominal pain   1. Intractable nausea and vomiting with epigastric abdominal pain likely secondary to duodenitis.  This perhaps may be related to his prior history of chemoradiation.  Continue hydration with normal saline with Zofran and Phenergan added for nausea and vomiting. 2. Severe leukocytosis.  There is some initial thought that perhaps steroids and Neulasta may have contributed to this process but I have spoken with Dr. Laurie Panda his oncologist who states that he has never had a white cell count this elevated.  He currently recommends empiric antibiotic therapy which I will initiate with IV Zosyn. 3. 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  These are not new per CT scan as comparison was in 2017 and he has had multiple repeat CT scans in 2018.  Currently on chemoradiation treatments at [facility] with no violent. 4. Type 2 diabetes with hyperglycemia.  Continue sliding scale insulin for management. Not currently tolerating diet.  5. Thrombocytopenia-worsening.  Continue on SCDs and monitor with repeat  CBC.   DVT prophylaxis:SCDs Code Status: DNR Family Communication: Wife and son at bedside Disposition Plan: Treatment of intractable nausea and vomiting with further GI evaluation pending   Consultants:   GI called-Dr. Karilyn Cota  Procedures:   None  Antimicrobials:   None   Subjective: Patient seen and evaluated today with ongoing nausea and vomiting as well as dry heaves.  He continues to have bilious output with minimal relief from Zofran and Phenergan.  Objective: Vitals:   01/04/18 2200 01/04/18 2257 01/04/18 2258 01/05/18 0604  BP: 113/63  (!) 145/83 127/87  Pulse:   81 77  Resp:   18 18  Temp:   (!) 97.5 F (36.4 C) 98.3 F (36.8 C)  TempSrc:   Oral Oral  SpO2:   97% 95%  Weight:  85 kg (187 lb 6.3 oz)    Height:  5\' 10"  (1.778 m)      Intake/Output Summary (Last 24 hours) at 01/05/2018 1347 Last data filed at 01/05/2018 0600 Gross per 24 hour  Intake 1606.67 ml  Output -  Net 1606.67 ml   Filed Weights   01/04/18 1049 01/04/18 2257  Weight: 88.5 kg (195 lb) 85 kg (187 lb 6.3 oz)    Examination:  General exam: Appears distressed due to nausea Respiratory system: Clear to auscultation. Respiratory effort normal. Cardiovascular system: S1 & S2 heard, RRR. No JVD, murmurs, rubs, gallops or clicks. No pedal edema. Gastrointestinal system: Abdomen is nondistended, soft and nontender. Hypoactive BS. Central nervous system: Alert and oriented. No focal neurological deficits. Extremities: Symmetric 5 x 5 power. Skin: No rashes, lesions or ulcers    Data  Reviewed: I have personally reviewed following labs and imaging studies  CBC: Recent Labs  Lab 01/04/18 1225 01/05/18 0426  WBC 61.8* 51.2*  NEUTROABS 60.0  --   HGB 10.6* 10.2*  HCT 32.6* 30.9*  MCV 93.7 94.0  PLT 88* 71*   Basic Metabolic Panel: Recent Labs  Lab 01/04/18 1225 01/05/18 0426  NA 131* 132*  K 3.7 3.6  CL 95* 98*  CO2 27 26  GLUCOSE 192* 177*  BUN 25* 19  CREATININE 0.94  0.89  CALCIUM 8.4* 7.9*   GFR: Estimated Creatinine Clearance: 71.8 mL/min (by C-G formula based on SCr of 0.89 mg/dL). Liver Function Tests: Recent Labs  Lab 01/04/18 1225 01/05/18 0426  AST 26 19  ALT 29 25  ALKPHOS 126 133*  BILITOT 0.8 0.8  PROT 5.8* 5.3*  ALBUMIN 2.9* 2.7*   No results for input(s): LIPASE, AMYLASE in the last 168 hours. No results for input(s): AMMONIA in the last 168 hours. Coagulation Profile: No results for input(s): INR, PROTIME in the last 168 hours. Cardiac Enzymes: No results for input(s): CKTOTAL, CKMB, CKMBINDEX, TROPONINI in the last 168 hours. BNP (last 3 results) No results for input(s): PROBNP in the last 8760 hours. HbA1C: No results for input(s): HGBA1C in the last 72 hours. CBG: Recent Labs  Lab 01/05/18 0754 01/05/18 1144  GLUCAP 182* 209*   Lipid Profile: No results for input(s): CHOL, HDL, LDLCALC, TRIG, CHOLHDL, LDLDIRECT in the last 72 hours. Thyroid Function Tests: No results for input(s): TSH, T4TOTAL, FREET4, T3FREE, THYROIDAB in the last 72 hours. Anemia Panel: No results for input(s): VITAMINB12, FOLATE, FERRITIN, TIBC, IRON, RETICCTPCT in the last 72 hours. Sepsis Labs: No results for input(s): PROCALCITON, LATICACIDVEN in the last 168 hours.  No results found for this or any previous visit (from the past 240 hour(s)).     Radiology Studies: Ct Abdomen Pelvis W Wo Contrast  Result Date: 01/05/2018 CLINICAL DATA:  Pancreatic carcinoma with lung metastasis peritoneal metastasis. Small-bowel resection 2014. Ongoing chemotherapy. Nausea and abdominal pain . EXAM: CT ABDOMEN AND PELVIS WITHOUT AND WITH CONTRAST TECHNIQUE: Multidetector CT imaging of the abdomen and pelvis was performed following the standard protocol before and following the bolus administration of intravenous contrast. CONTRAST:  ISOVUE-300 IOPAMIDOL (ISOVUE-300) INJECTION 61% COMPARISON:  CT 07/17/2016 FINDINGS: Lower chest: 11 mm nodule in the  RIGHT lower lobe (image 20/9) is not clearly identified on comparison exam 07/10/2016 small lingular nodule on image 6/9 measures 3 mm not localized on prior. RIGHT middle lobe nodule anterior to the fissure on image 6/9 measures 6 mm also not clearly seen on prior. Hepatobiliary: Unfortunately, there is a new lesion in the RIGHT hepatic lobe (segment 5) measuring 2.3 x 1.7 cm. Second subcapsular nodule along the posterior margin of the RIGHT hepatic lobe (segment 5) is also new. Two small lesions in the dome liver measure less than 1 cm (image 13/4) and are also new. Gallbladder normal.  No biliary duct dilatation Pancreas: Head of the pancreas is normal. No biliary duct dilatation. Spleen: Normal spleen Adrenals/urinary tract: Adrenal glands normal. Benign-appearing cysts of the kidneys ureters and bladder normal Stomach/Bowel: Stomach is distended with fluid. There is some constriction of the pylorus which may be physiologic (image 56/4). There is bowel wall edema along the second portion the duodenum and third portion duodenum (image 73/4) which is new from prior. Small bowel appendix and cecum normal. There is mild bowel wall thickening along the transverse colon (image 76/4). There is  bowel wall thickening in the rectum (image 128/4.) Large diverticulum along the anterior margin of the rectum (image 72/15) Within the central mesentery of the small bowel there is angular nodule with peripheral enhancement measuring 2 cm x 2 cm (image 89/4) which is new from comparison exam. Smaller peritoneal metastasis in the LEFT upper quadrant adjacent to proximal small bowel (image 72/4. Vascular/Lymphatic: Abdominal aorta is normal caliber. No periportal or retroperitoneal adenopathy. No pelvic adenopathy. Reproductive: Prostate normal Other: No intraperitoneal free fluid Musculoskeletal: Hemangioma within the L1 and L2 vertebral bodies as well as the T11 vertebral body. IMPRESSION: 1. New pulmonary nodules at the lung  bases most consistent with metastatic pulmonary nodules. 2. At least 4 new lesions within the RIGHT hepatic lobe consistent with hepatic metastasis. 3. New enhancing nodules within the central mesentery consistent with peritoneal metastasis. Additionally, there is mural thickening /edema within the second portion duodenum, transverse colon, and rectum. No measurable mass lesion through these regions but findings are concerning for serosal metastasis. 4. Edema through the duodenum may have a component of duodenitis. Thickening through the pyloric region may be physiologic but cannot exclude serosal metastasis. Electronically Signed   By: Genevive Bi M.D.   On: 01/05/2018 12:30     Scheduled Meds: . feeding supplement (ENSURE ENLIVE)  237 mL Oral TID BM  . gabapentin  600 mg Oral BID  . insulin aspart  0-9 Units Subcutaneous TID WC  . levothyroxine  150 mcg Oral QODAY  . pantoprazole  40 mg Oral Daily  . tamsulosin  0.4 mg Oral BH-q7a   Continuous Infusions: . sodium chloride 100 mL/hr at 01/05/18 0906  . famotidine (PEPCID) IV       LOS: 0 days    Time spent: 30 minutes    Shylynn Bruning Hoover Brunette, DO [facility] Pager [phone]  If 7PM-7AM, please contact night-coverage [email or web address] Password [email or web address] 01/05/2018, 1:47 PM

## 2018-01-05 NOTE — Progress Notes (Signed)
Pharmacy Antibiotic Note  Albert Richards is a 77 y.o. male admitted on 01/04/2018 with intra-abdominal infection.  Pharmacy has been consulted for zosyn dosing.  Plan: Zosyn 3.375g IV q8h (4 hour infusion).  F/U cxs and clinical progress Monitor V/S and labs  Height: 5\' 10"  (177.8 cm) Weight: 187 lb 6.3 oz (85 kg) IBW/kg (Calculated) : 73  Temp (24hrs), Avg:97.9 F (36.6 C), Min:97.5 F (36.4 C), Max:98.3 F (36.8 C)  Recent Labs  Lab 01/04/18 1225 01/05/18 0426  WBC 61.8* 51.2*  CREATININE 0.94 0.89    Estimated Creatinine Clearance: 71.8 mL/min (by C-G formula based on SCr of 0.89 mg/dL).    No Known Allergies  Antimicrobials this admission: Zosyn 6/10>>   Dose adjustments this admission: N/A  Microbiology results: No cxs  Thank you for allowing pharmacy to be a part of this patient's care.  Isac Sarna, BS Pharm D, [place] Clinical Pharmacist Pager [phone] 01/05/2018 2:09 PM

## 2018-01-05 NOTE — Consult Note (Signed)
Referring Provider: Heath Lark, DO Primary Care Physician:  [name], MD Primary Gastroenterologist:  Dr. Laural Golden  Reason for Consultation:    Nausea and vomiting in the patient receiving chemotherapy for metastatic pancreatic carcinoma. CT suggests gastric outlet obstruction.  HPI:   Patient is 77 year old Caucasian male with history of pancreatic carcinoma which was diagnosed back in December 2014.  He unfortunately was found to have stage IV disease and therefore not a candidate for resection even though the tumor was located in tail of pancreas.  He has been under care of Dr. Tressie Stalker.  Over the last 3 years he has undergone therapy with multiple agents and has experienced side effects.  He also had radiation to his pelvis in February this year.  His disease has been progressive and is involved lungs as well as liver and peritoneum. He has been receiving FOLFIRINOX.  With first 3 cycles he had manageable side effects and he did not have to be hospitalized. He received chemotherapy on 12/30/2017 and began to feel poorly a day later.  He received Decadron and Neulasta on 01/01/2018.  For the last few days he has had poor appetite he has been nauseated has had burping and hiccups.  He has not experienced abdominal pain diarrhea melena or rectal bleeding. He was brought to the emergency room last night.  He did vomit bilious material and also small amount of blood. CT revealed markedly dilated fluid-filled stomach with question of narrowing in region of pylorus along with multiple other findings. NG tube was placed earlier today with removal of 4 L of fluid and he feels much better. Since white cell count was over 61,000 and today is over 51,000. Patient was begun on Zosyn.  He is not having fever chills or productive cough. According to his family members he may have lost few pounds recently.  Since then condition was diagnosed he has lost close to 25 pounds which has been a gradual weight  loss. He is passing formed stools although they have been thin caliber.   Past Medical History:  Diagnosis Date  . Arthritis   . Bowel obstruction ([place])   . 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 Diabetes mellitus without complication ([place])   . History of kidney stones 10-12 yrs ago  . Hypothyroidism   . pancreatic ca dx'd 2014  . Pneumonia   . [place] spotted fever        History of DVT.  Past Surgical History:  Procedure Laterality Date  . AMPUTATION Left 01/08/2016   Procedure: AMPUTATION REVISION LEFT FIRST FINGER ;  Surgeon: Charlotte Crumb, MD;  Location: [facility];  Service: Orthopedics;  Laterality: Left;  . EUS N/A 07/08/2013   Procedure: UPPER ENDOSCOPIC ULTRASOUND (EUS) LINEAR;  Surgeon: Milus Banister, MD;  Location: [facility];  Service: Endoscopy;  Laterality: N/A;  . I&D EXTREMITY Left 01/08/2016   Procedure: IRRIGATION AND DEBRIDEMENT LEFT THUMB;  Surgeon: Charlotte Crumb, MD;  Location: [facility];  Service: Orthopedics;  Laterality: Left;  . LAPAROTOMY N/A 07/11/2016   Procedure: EXPLORATORY LAPAROTOMY;  Surgeon: Autumn Messing III, MD;  Location: [facility];  Service: General;  Laterality: N/A;  . LAPAROTOMY N/A 07/17/2016   Procedure: EXPLORATORY LAPAROTOMY, EVACUATION OF HEMATOMA;  Surgeon: Mickeal Skinner, MD;  Location: [facility];  Service: General;  Laterality: N/A;  . PORTACATH PLACEMENT N/A 07/30/2013   Procedure: INSERTION PORT-A-CATH;  Surgeon: Stark Klein, MD;  Location: [facility];  Service: General;  Laterality: N/A;  . TONSILLECTOMY  as child    Prior to Admission medications   Medication Sig Start Date End Date Taking? Authorizing Provider  apixaban (ELIQUIS) 2.5 MG TABS tablet Take 1 tablet by mouth 2 (two) times daily. 11/24/17  Yes [provider]  dexamethasone (DECADRON) 4 MG tablet Take 1 tablet by mouth 2 (two) times daily. 12/30/17  Yes [provider]  gabapentin (NEURONTIN) 600 MG tablet Take 600 mg by  mouth 2 (two) times daily. 01/10/16  Yes [provider]  glipiZIDE (GLUCOTROL XL) 10 MG 24 hr tablet Take 10 mg by mouth 2 (two) times daily.  11/07/15  Yes [provider]  levothyroxine (SYNTHROID, LEVOTHROID) 200 MCG tablet Take 200 mcg by mouth daily before breakfast.   Yes [provider]  LORazepam (ATIVAN) 1 MG tablet Take 1 mg by mouth every 3 (three) hours.   Yes [provider]  naproxen sodium (ALEVE) 220 MG tablet Take 220 mg by mouth daily as needed.   Yes [provider]  nitroGLYCERIN (NITROSTAT) 0.4 MG SL tablet Place 0.4 mg under the tongue as needed for chest pain.  03/09/15  Yes [provider]  omeprazole (PRILOSEC) 20 MG capsule Take 40 mg by mouth every morning.    Yes [provider]  oxyCODONE (OXYCONTIN) 10 mg 12 hr tablet Take 1 tablet by mouth 2 (two) times daily as needed. 12/30/17 02/28/18 Yes [provider]  potassium chloride (K-DUR) 10 MEQ tablet Take 1 tablet by mouth 2 (two) times daily. 12/30/17  Yes [provider]  Tamsulosin HCl (FLOMAX) 0.4 MG CAPS Take 0.4 mg by mouth every morning.   Yes [provider]    Current Facility-Administered Medications  Medication Dose Route Frequency Provider Last Rate Last Dose  . 0.9 %  sodium chloride infusion   Intravenous Continuous Oswald Hillock, MD 100 mL/hr at 01/05/18 1823    . famotidine (PEPCID) IVPB 20 mg premix  20 mg Intravenous Q12H Shah, Pratik D, DO   Stopped at 01/05/18 1453  . feeding supplement (ENSURE ENLIVE) (ENSURE ENLIVE) liquid 237 mL  237 mL Oral TID BM Darrick Meigs, Marge Duncans, MD      . HYDROmorphone (DILAUDID) injection 0.5 mg  0.5 mg Intravenous Q4H PRN Oswald Hillock, MD   0.5 mg at 01/05/18 1408  . insulin aspart (novoLOG) injection 0-9 Units  0-9 Units Subcutaneous TID WC Oswald Hillock, MD   3 Units at 01/05/18 1818  . [START ON 01/06/2018] levothyroxine (SYNTHROID, LEVOTHROID) injection 75 mcg  75 mcg Intravenous Daily Shah,  Pratik D, DO      . ondansetron (ZOFRAN) tablet 4 mg  4 mg Oral Q6H PRN Oswald Hillock, MD       Or  . ondansetron (ZOFRAN) injection 4 mg  4 mg Intravenous Q6H PRN Oswald Hillock, MD   4 mg at 01/05/18 0430  . pantoprazole (PROTONIX) EC tablet 40 mg  40 mg Oral Daily [place], Marge Duncans, MD      . phenol (CHLORASEPTIC) mouth spray 1 spray  1 spray Mouth/Throat PRN Manuella Ghazi, Pratik D, DO   1 spray at 01/05/18 1818  . piperacillin-tazobactam (ZOSYN) IVPB 3.375 g  3.375 g Intravenous Q8H Shah, Pratik D, DO 12.5 mL/hr at 01/05/18 1510 3.375 g at 01/05/18 1510  . promethazine (PHENERGAN) injection 12.5 mg  12.5 mg Intravenous Q4H PRN Manuella Ghazi, Pratik D, DO   12.5 mg at 01/05/18 1215  . tamsulosin (FLOMAX) capsule  0.4 mg  0.4 mg Oral Loman Brooklyn, MD        Allergies as of 01/04/2018  . (No Known Allergies)    History reviewed. No pertinent family history.  Social History   Socioeconomic History  . Marital status: Married    Spouse name: Not on file  . Number of children: Not on file  . Years of education: Not on file  . Highest education level: Not on file  Occupational History  . Not on file  Social Needs  . Financial resource strain: Not on file  . Food insecurity:    Worry: Not on file    Inability: Not on file  . Transportation needs:    Medical: Not on file    Non-medical: Not on file  Tobacco Use  . Smoking status: Former Smoker    Packs/day: 1.00    Years: 50.00    Pack years: 50.00    Types: Cigarettes    Start date: 07/30/1955    Last attempt to quit: 07/29/2005    Years since quitting: 12.4  . Smokeless tobacco: Never Used  Substance and Sexual Activity  . Alcohol use: No  . Drug use: No  . Sexual activity: Not on file  Lifestyle  . Physical activity:    Days per week: Not on file    Minutes per session: Not on file  . Stress: Not on file  Relationships  . Social connections:    Talks on phone: Not on file    Gets together: Not on file    Attends religious service:  Not on file    Active member of club or organization: Not on file    Attends meetings of clubs or organizations: Not on file    Relationship status: Not on file  . Intimate partner violence:    Fear of current or ex partner: Not on file    Emotionally abused: Not on file    Physically abused: Not on file    Forced sexual activity: Not on file  Other Topics Concern  . 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 Conjunctiva is pink.  Sclerae nonicteric. Oropharyngeal mucosa is normal. No neck masses or thyromegaly noted. Cardiac exam with regular rhythm normal S1 and S2.  No murmur gallop noted. Lungs are clear to auscultation. Abdomen is full with midline scar which is fairly white in the middle and deep.  Bowel sounds are normal.  On palpation abdomen is soft and nontender with organomegaly or masses. No peripheral edema or clubbing noted.  Left fourth finger has been distally amputated.  Lab Results: Recent Labs    01/04/18 1225 01/05/18 0426  WBC 61.8* 51.2*  HGB 10.6* 10.2*  HCT 32.6* 30.9*  PLT 88* 71*   BMET Recent Labs    01/04/18 1225 01/05/18 0426  NA 131* 132*  K 3.7 3.6  CL 95* 98*  CO2 27 26  GLUCOSE 192* 177*  BUN 25* 19  CREATININE 0.94 0.89  CALCIUM 8.4* 7.9*   LFT Recent Labs    01/05/18 0426  PROT 5.3*  ALBUMIN 2.7*  AST 19  ALT 25  ALKPHOS 133*  BILITOT 0.8    Studies/Results: Ct Abdomen Pelvis W Wo Contrast  Result  Date: 01/05/2018 CLINICAL DATA:  Pancreatic carcinoma with lung metastasis peritoneal metastasis. Small-bowel resection 2014. Ongoing chemotherapy. Nausea and abdominal pain . EXAM: CT ABDOMEN AND PELVIS WITHOUT AND WITH CONTRAST TECHNIQUE: Multidetector CT imaging of the abdomen and pelvis was performed following the standard protocol before and following the bolus administration of intravenous contrast. CONTRAST:  162mL ISOVUE-300 IOPAMIDOL (ISOVUE-300) INJECTION 61% COMPARISON:  CT 07/17/2016 FINDINGS: Lower chest: 11 mm nodule in the RIGHT lower lobe (image 20/9) is not clearly identified on comparison exam 07/10/2016 small lingular nodule on image 6/9 measures 3 mm not localized on prior. RIGHT middle lobe nodule anterior to the fissure on image 6/9 measures 6 mm also not clearly seen on prior. Hepatobiliary: Unfortunately, there is a new lesion in the RIGHT hepatic lobe (segment 5) measuring 2.3 x 1.7 cm. Second subcapsular nodule along the posterior margin of the RIGHT hepatic lobe (segment 5) is also new. Two small lesions in the dome liver measure less than 1 cm (image 13/4) and are also new. Gallbladder normal.  No biliary duct dilatation Pancreas: Head of the pancreas is normal. No biliary duct dilatation. Spleen: Normal spleen Adrenals/urinary tract: Adrenal glands normal. Benign-appearing cysts of the kidneys ureters and bladder normal Stomach/Bowel: Stomach is distended with fluid. There is some constriction of the pylorus which may be physiologic (image 56/4). There is bowel wall edema along the second portion the duodenum and third portion duodenum (image 73/4) which is new from prior. Small bowel appendix and cecum normal. There is mild bowel wall thickening along the transverse colon (image 76/4). There is bowel wall thickening in the rectum (image 128/4.) Large diverticulum along the anterior margin of the rectum (image 72/15) Within the central mesentery of the small bowel there is angular nodule  with peripheral enhancement measuring 2 cm x 2 cm (image 89/4) which is new from comparison exam. Smaller peritoneal metastasis in the LEFT upper quadrant adjacent to proximal small bowel (image 72/4. Vascular/Lymphatic: Abdominal aorta is normal caliber. No periportal or retroperitoneal adenopathy. No pelvic adenopathy. Reproductive: Prostate normal Other: No intraperitoneal free fluid Musculoskeletal: Hemangioma within the L1 and L2 vertebral bodies as well as the T11 vertebral body. IMPRESSION: 1. New pulmonary nodules at the lung bases most consistent with metastatic pulmonary nodules. 2. At least 4 new lesions within the RIGHT hepatic lobe consistent with hepatic metastasis. 3. New enhancing nodules within the central mesentery consistent with peritoneal metastasis. Additionally, there is mural thickening /edema within the second portion duodenum, transverse colon, and rectum. No measurable mass lesion through these regions but findings are concerning for serosal metastasis. 4. Edema through the duodenum may have a component of duodenitis. Thickening through the pyloric region may be physiologic but cannot exclude serosal metastasis. Electronically Signed   By: Suzy Bouchard M.D.   On: 01/05/2018 12:30   Dg Chest Port 1 View  Result Date: 01/05/2018 CLINICAL DATA:  Nasogastric tube placement. EXAM: PORTABLE CHEST 1 VIEW COMPARISON:  Chest radiograph July 24, 2016 FINDINGS: Nasogastric tube past GE junction, tip out of field of view. Single-lumen LEFT chest Port-A-Cath tip projecting in the superior vena cava unchanged. Cardiac  silhouette is mildly enlarged. Mediastinal silhouette is suspicious. Bibasilar strandy densities. Small RIGHT pleural effusion. No pneumothorax. Soft tissue included osseous are non suspicious. IMPRESSION: Nasogastric tube past GE junction region, distal tip out of field of view. Small RIGHT pleural effusion.  Bibasilar atelectasis. Electronically Signed   By: Elon Alas  M.D.   On: 01/05/2018 14:34   I have reviewed abdominopelvic CT from this morning. Also reviewed chest and abdominal pelvic CT report from study done at [facility] on 11/03/2017.  There was no wall thickening to stomach or duodenum.  Assessment;  Patient is 76 year old Caucasian male with history of metastatic and chronic adenocarcinoma which was diagnosed in December 2014 who is been receiving chemotherapy under supervision by Dr. Everardo All who was last chemotherapy was on 12/30/2017 followed by Decadron and you last 2 days later who presents with nausea and vomiting he also had scant amount of hematemesis.  CT revealed dilated fluid-filled stomach.  NG tube to place for decompression and he is feeling much better. He has narrowing in the region of pylorus but he also has edema to second and third part of duodenum.  I suspect his acute illness is due to chemotherapy-induced gastritis and enteritis but he could also have pyloric stenosis.  Gastric outlet obstruction due to metastatic disease remains in differential diagnosis but less likely.  ossibly will need esophagogastroduodenoscopy. He has marked leukocytosis felt to be due to acute illness Decadron and Neulasta although his white cell count is never been this high previously.  Therefore it would be appropriate to empirically cover him with broad-spectrum antibiotic until his condition improves.  Recommendations;  Continue NG suction and famotidine 20 mg IV every 12 hours. Repeat lab studies in a.m. to consist of CBC and chemistry panel. Patient will be reevaluated in a.m.   LOS: 0 days   Najeeb Rehman  01/05/2018, 6:26 PM

## 2018-01-06 LAB — COMPREHENSIVE METABOLIC PANEL
ALT: 23 U/L (ref 17–63)
ANION GAP: 9 (ref 5–15)
AST: 15 U/L (ref 15–41)
Albumin: 2.7 g/dL — ABNORMAL LOW (ref 3.5–5.0)
Alkaline Phosphatase: 153 U/L — ABNORMAL HIGH (ref 38–126)
BILIRUBIN TOTAL: 0.8 mg/dL (ref 0.3–1.2)
BUN: 19 mg/dL (ref 6–20)
CO2: 23 mmol/L (ref 22–32)
Calcium: 8 mg/dL — ABNORMAL LOW (ref 8.9–10.3)
Chloride: 101 mmol/L (ref 101–111)
Creatinine, Ser: 0.99 mg/dL (ref 0.61–1.24)
GFR calc non Af Amer: >60 mL/min (ref >60)
Glucose, Bld: 211 mg/dL — ABNORMAL HIGH (ref 65–99)
POTASSIUM: 3.9 mmol/L (ref 3.5–5.1)
Sodium: 133 mmol/L — ABNORMAL LOW (ref 135–145)
TOTAL PROTEIN: 5.4 g/dL — AB (ref 6.5–8.1)

## 2018-01-06 LAB — CBC
HEMATOCRIT: 31.7 % — AB (ref 39.0–52.0)
Hemoglobin: 10.4 g/dL — ABNORMAL LOW (ref 13.0–17.0)
MCH: 30.4 pg (ref 26.0–34.0)
MCHC: 32.8 g/dL (ref 30.0–36.0)
MCV: 92.7 fL (ref 78.0–100.0)
PLATELETS: 53 10*3/uL — AB (ref 150–400)
RBC: 3.42 MIL/uL — ABNORMAL LOW (ref 4.22–5.81)
RDW: 20 % — AB (ref 11.5–15.5)
WBC: 40.8 10*3/uL — ABNORMAL HIGH (ref 4.0–10.5)

## 2018-01-06 LAB — GLUCOSE, CAPILLARY
GLUCOSE-CAPILLARY: 204 mg/dL — AB (ref 65–99)
Glucose-Capillary: 204 mg/dL — ABNORMAL HIGH (ref 65–99)
Glucose-Capillary: 171 mg/dL — ABNORMAL HIGH (ref 65–99)
Glucose-Capillary: 147 mg/dL — ABNORMAL HIGH (ref 65–99)

## 2018-01-06 MED ORDER — LORAZEPAM 2 MG/ML IJ SOLN: 1.0000 mg | INTRAMUSCULAR | Status: DC | PRN

## 2018-01-06 MED ORDER — INSULIN ASPART 100 UNIT/ML ~~LOC~~ SOLN
0.0000 [IU] | SUBCUTANEOUS | Status: DC
  Administered 2018-01-06: 2 [IU] via SUBCUTANEOUS
  Administered 2018-01-06: 3 [IU] via SUBCUTANEOUS
  Administered 2018-01-07: 2 [IU] via SUBCUTANEOUS
  Administered 2018-01-07 (×4): 3 [IU] via SUBCUTANEOUS
  Administered 2018-01-08: 2 [IU] via SUBCUTANEOUS

## 2018-01-06 NOTE — Progress Notes (Signed)
  Subjective:  Patient has no complaints.  He is not nauseated anymore.  He denies abdominal pain.  According to family members NG tube came out twice during the night and was replaced but then he decided not to have it.  He became confused.  He had a bowel movement this morning when he passed small amount of formed stool.  Objective: Blood pressure 132/83, pulse 89, temperature 98 F (36.7 C), temperature source Oral, resp. rate 18, height 5\' 10"  (1.778 m), weight 187 lb 6.3 oz (85 kg), SpO2 95 %.  Patient is walked out of the bathroom back into bed. He is alert and responds appropriately to questions.  He knows he is at any point hospital. Abdominal exam with long scar as above.  Bowel sounds are normal.  On palpation abdomen is soft and nontender.  No succussion splash noted epigastric region on auscultation.  NG output in less than 24 hours was 2300 mL.  Labs/studies Results:  Recent Labs    20-Jan-2018 1225 01/05/18 0426 01/06/18 0721  WBC 61.8* 51.2* 40.8*  HGB 10.6* 10.2* 10.4*  HCT 32.6* 30.9* 31.7*  PLT 88* 71* 53*    BMET  Recent Labs    2018/01/20 1225 01/05/18 0426 01/06/18 0721  NA 131* 132* 133*  K 3.7 3.6 3.9  CL 95* 98* 101  CO2 27 26 23   GLUCOSE 192* 177* 211*  BUN 25* 19 19  CREATININE 0.94 0.89 0.99  CALCIUM 8.4* 7.9* 8.0*    LFT  Recent Labs    2018-01-20 1225 01/05/18 0426 01/06/18 0721  PROT 5.8* 5.3* 5.4*  ALBUMIN 2.9* 2.7* 2.7*  AST 26 19 15   ALT 29 25 23   ALKPHOS 126 133* 153*  BILITOT 0.8 0.8 0.8      Assessment:  #1.  Nausea and vomiting.  CT on admission revealed dilated fluid-filled stomach with narrowing of the pyloric region suggestive of gastric colored production.  He had NG tube for about 16 hours with significant return.  NG tube is out and he is now asymptomatic.  It is possible that his symptoms are due to toxic effects of chemotherapy on GI tract.  He should consider EGD prior to discharge. Confusion appears to be due to  promethazine which has been discontinued. Mild hyponatremia improving.  #2.  Leukocytosis.  WBC is coming down.  He does not appear to be septic.  He is not febrile.  Suspect leukocytosis multifactorial i.e. Neulasta Decadron and acute illness.  He remains on Zosyn.  #3.  Metastatic adenocarcinoma of the pancreas.  Was diagnosed in December 2014.  Has received multiple chemotherapeutic agents over the last 4 years.  He is presently on FOLFIRINOX last dose was 1 week ago.  He is under care of Dr. Everardo All  Recommendations:  Patient begun on clear liquids. CBC in a.m. Possible EGD in a.m.

## 2018-01-06 NOTE — Progress Notes (Signed)
PROGRESS NOTE    Albert Richards  ZTI:[id number] DOB: 13-Nov-1940 DOA: 01/04/2018 PCP: Carylon Perches, MD   Brief Narrative:   Albert Richards  is a 77 y.o. 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  Patient is currently getting chemotherapy for pancreatic cancer, last chemo was on Tuesday after that patient became nauseated and had abdominal pain.  He has been started on symptomatic treatment with IV fluid and Zofran with minimal relief. CT scan this morning demonstrates some duodenitis which is likely contributing to the process.  He has received an NG tube yesterday with significant relief of his symptoms, but has managed to pull this out twice.  He has received some Phenergan and has become quite agitated as a result of this medication.  No further nausea or vomiting symptoms noted this morning and GI has seen patient with recommendations for advancement to clear liquid diet at this time.  His leukocytosis is steadily improving on IV Zosyn.   Assessment & Plan:   Active Problems:   Stage IV adenocarcinoma of pancreas (HCC)   COPD (chronic obstructive pulmonary disease) (HCC)   Diabetes mellitus (HCC)   Small bowel perforation s/p SB resection 07/11/2016   Abdominal pain   1. Intractable nausea and vomiting with epigastric abdominal pain likely secondary to duodenitis.  There is concern for pyloric stenosis with possible gastric outlet obstruction and patient will require EGD which is planned for tomorrow per GI Dr. Karilyn Cota. CLD initiated for now and will keep on Zofran as needed; avoiding Phenergan due to agitation. 2. Severe leukocytosis-improving.  There is some initial thought that perhaps steroids and Neulasta may have contributed to this process but I have spoken with Dr. Mariel Sleet his oncologist who states that he has never had a white cell count this elevated.  He  currently recommends empiric antibiotic therapy with IV Zosyn which appears to be helping. 3. 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  These are not new per CT scan as comparison was in 2017 and he has had multiple repeat CT scans in 2018.  Currently on chemoradiation treatments at [facility]. 4. Type 2 diabetes with hyperglycemia.  Continue sliding scale insulin for management. CLD initiated today. 5. Thrombocytopenia-worsening.  Continue on SCDs and monitor with repeat CBC. Likely related to cancer/chemoradiation.   DVT prophylaxis:SCDs Code Status: DNR Family Communication: Wife and son at bedside Disposition Plan: Intractable nausea and vomiting with plans for EGD per GI in a.m.  Clear liquid diet started today as symptoms appear to have improved for now.   Consultants:   GI called-Dr. Karilyn Cota  Procedures:   None  Antimicrobials:   None   Subjective: Patient seen and evaluated today with apparent improvement in his GI symptoms after placement of NG tube yesterday.  He has managed to pull his NG tube out twice now and has had over 2 L of output.  He has become agitated with use of Phenergan overnight and early into this morning.  Objective: Vitals:   01/05/18 0604 01/05/18 1622 01/05/18 2123 01/06/18 0657  BP: 127/87 (!) 127/91 132/87 132/83  Pulse: 77 98 (!) 102 89  Resp: 18 20 18 18   Temp: 98.3 F (36.8 C) 97.6 F (36.4 C) 98.7 F (37.1 C) 98 F (36.7 C)  TempSrc: Oral  Oral Oral  SpO2: 95% 97% 96% 95%  Weight:      Height:  Intake/Output Summary (Last 24 hours) at 01/06/2018 1202 Last data filed at 01/06/2018 1140 Gross per 24 hour  Intake 2250 ml  Output 2332 ml  Net -82 ml   Filed Weights   01/04/18 1049 01/04/18 2257  Weight: 88.5 kg (195 lb) 85 kg (187 lb 6.3 oz)    Examination:  General exam: Mildly anxious/agitated Respiratory system: Clear to auscultation. Respiratory effort normal. Cardiovascular system: S1 & S2  heard, RRR. No JVD, murmurs, rubs, gallops or clicks. No pedal edema. Gastrointestinal system: Abdomen is nondistended, soft and nontender. Hypoactive BS. Central nervous system: Alert and oriented. No focal neurological deficits. Extremities: Symmetric 5 x 5 power. Skin: No rashes, lesions or ulcers    Data Reviewed: I have personally reviewed following labs and imaging studies  CBC: Recent Labs  Lab 01/04/18 1225 01/05/18 0426 01/06/18 0721  WBC 61.8* 51.2* 40.8*  NEUTROABS 60.0  --   --   HGB 10.6* 10.2* 10.4*  HCT 32.6* 30.9* 31.7*  MCV 93.7 94.0 92.7  PLT 88* 71* 53*   Basic Metabolic Panel: Recent Labs  Lab 01/04/18 1225 01/05/18 0426 01/06/18 0721  NA 131* 132* 133*  K 3.7 3.6 3.9  CL 95* 98* 101  CO2 27 26 23   GLUCOSE 192* 177* 211*  BUN 25* 19 19  CREATININE 0.94 0.89 0.99  CALCIUM 8.4* 7.9* 8.0*   GFR: Estimated Creatinine Clearance: 64.5 mL/min (by C-G formula based on SCr of 0.99 mg/dL). Liver Function Tests: Recent Labs  Lab 01/04/18 1225 01/05/18 0426 01/06/18 0721  AST 26 19 15   ALT 29 25 23   ALKPHOS 126 133* 153*  BILITOT 0.8 0.8 0.8  PROT 5.8* 5.3* 5.4*  ALBUMIN 2.9* 2.7* 2.7*   No results for input(s): LIPASE, AMYLASE in the last 168 hours. No results for input(s): AMMONIA in the last 168 hours. Coagulation Profile: No results for input(s): INR, PROTIME in the last 168 hours. Cardiac Enzymes: No results for input(s): CKTOTAL, CKMB, CKMBINDEX, TROPONINI in the last 168 hours. BNP (last 3 results) No results for input(s): PROBNP in the last 8760 hours. HbA1C: Recent Labs    01/04/18 1225  HGBA1C 7.7*   CBG: Recent Labs  Lab 01/05/18 1144 01/05/18 1659 01/05/18 2122 01/06/18 0949 01/06/18 1128  GLUCAP 209* 204* 154* 204* 204*   Lipid Profile: No results for input(s): CHOL, HDL, LDLCALC, TRIG, CHOLHDL, LDLDIRECT in the last 72 hours. Thyroid Function Tests: No results for input(s): TSH, T4TOTAL, FREET4, T3FREE, THYROIDAB in  the last 72 hours. Anemia Panel: No results for input(s): VITAMINB12, FOLATE, FERRITIN, TIBC, IRON, RETICCTPCT in the last 72 hours. Sepsis Labs: No results for input(s): PROCALCITON, LATICACIDVEN in the last 168 hours.  No results found for this or any previous visit (from the past 240 hour(s)).     Radiology Studies: Ct Abdomen Pelvis W Wo Contrast  Result Date: 01/05/2018 CLINICAL DATA:  Pancreatic carcinoma with lung metastasis peritoneal metastasis. Small-bowel resection 2014. Ongoing chemotherapy. Nausea and abdominal pain . EXAM: CT ABDOMEN AND PELVIS WITHOUT AND WITH CONTRAST TECHNIQUE: Multidetector CT imaging of the abdomen and pelvis was performed following the standard protocol before and following the bolus administration of intravenous contrast. CONTRAST:  ISOVUE-300 IOPAMIDOL (ISOVUE-300) INJECTION 61% COMPARISON:  CT 07/17/2016 FINDINGS: Lower chest: 11 mm nodule in the RIGHT lower lobe (image 20/9) is not clearly identified on comparison exam 07/10/2016 small lingular nodule on image 6/9 measures 3 mm not localized on prior. RIGHT middle lobe nodule anterior to the fissure on  image 6/9 measures 6 mm also not clearly seen on prior. Hepatobiliary: Unfortunately, there is a new lesion in the RIGHT hepatic lobe (segment 5) measuring 2.3 x 1.7 cm. Second subcapsular nodule along the posterior margin of the RIGHT hepatic lobe (segment 5) is also new. Two small lesions in the dome liver measure less than 1 cm (image 13/4) and are also new. Gallbladder normal.  No biliary duct dilatation Pancreas: Head of the pancreas is normal. No biliary duct dilatation. Spleen: Normal spleen Adrenals/urinary tract: Adrenal glands normal. Benign-appearing cysts of the kidneys ureters and bladder normal Stomach/Bowel: Stomach is distended with fluid. There is some constriction of the pylorus which may be physiologic (image 56/4). There is bowel wall edema along the second portion the duodenum and third  portion duodenum (image 73/4) which is new from prior. Small bowel appendix and cecum normal. There is mild bowel wall thickening along the transverse colon (image 76/4). There is bowel wall thickening in the rectum (image 128/4.) Large diverticulum along the anterior margin of the rectum (image 72/15) Within the central mesentery of the small bowel there is angular nodule with peripheral enhancement measuring 2 cm x 2 cm (image 89/4) which is new from comparison exam. Smaller peritoneal metastasis in the LEFT upper quadrant adjacent to proximal small bowel (image 72/4. Vascular/Lymphatic: Abdominal aorta is normal caliber. No periportal or retroperitoneal adenopathy. No pelvic adenopathy. Reproductive: Prostate normal Other: No intraperitoneal free fluid Musculoskeletal: Hemangioma within the L1 and L2 vertebral bodies as well as the T11 vertebral body. IMPRESSION: 1. New pulmonary nodules at the lung bases most consistent with metastatic pulmonary nodules. 2. At least 4 new lesions within the RIGHT hepatic lobe consistent with hepatic metastasis. 3. New enhancing nodules within the central mesentery consistent with peritoneal metastasis. Additionally, there is mural thickening /edema within the second portion duodenum, transverse colon, and rectum. No measurable mass lesion through these regions but findings are concerning for serosal metastasis. 4. Edema through the duodenum may have a component of duodenitis. Thickening through the pyloric region may be physiologic but cannot exclude serosal metastasis. Electronically Signed   By: Genevive Bi M.D.   On: 01/05/2018 12:30   Dg Chest Port 1 View  Result Date: 01/05/2018 CLINICAL DATA:  Nasogastric tube placement. EXAM: PORTABLE CHEST 1 VIEW COMPARISON:  Chest radiograph July 24, 2016 FINDINGS: Nasogastric tube past GE junction, tip out of field of view. Single-lumen LEFT chest Port-A-Cath tip projecting in the superior vena cava unchanged. Cardiac  silhouette is mildly enlarged. Mediastinal silhouette is suspicious. Bibasilar strandy densities. Small RIGHT pleural effusion. No pneumothorax. Soft tissue included osseous are non suspicious. IMPRESSION: Nasogastric tube past GE junction region, distal tip out of field of view. Small RIGHT pleural effusion.  Bibasilar atelectasis. Electronically Signed   By: Awilda Metro M.D.   On: 01/05/2018 14:34     Scheduled Meds: . feeding supplement (ENSURE ENLIVE)  237 mL Oral TID BM  . insulin aspart  0-9 Units Subcutaneous TID WC  . levothyroxine  75 mcg Intravenous Daily  . pantoprazole  40 mg Oral Daily  . tamsulosin  0.4 mg Oral BH-q7a   Continuous Infusions: . sodium chloride 100 mL/hr at 01/06/18 0936  . famotidine (PEPCID) IV Stopped (01/06/18 1017)  . piperacillin-tazobactam (ZOSYN)  IV Stopped (01/06/18 0359)     LOS: 1 day    Time spent: 30 minutes    Kayal Mula Hoover Brunette, DO [facility] Pager [phone]  If 7PM-7AM, please contact night-coverage [email or web address] Password [email or web address]  01/06/2018, 12:02 PM

## 2018-01-06 NOTE — Plan of Care (Signed)
Continue planned regimen 

## 2018-01-06 NOTE — Progress Notes (Signed)
Message sent to on call regarding replacing NG tube after patient pulled out by accident, although stated at beginning of shift "I'm  gonna pull this tube out", 250 ml noted at marking at 1900,upon replacing the same, 1300 cc dark green bile obtained.Waiting for response.

## 2018-01-07 ENCOUNTER — Encounter (HOSPITAL_COMMUNITY)
Admission: EM | Disposition: A | Discharge status: Home / Self Care | Payer: Self-pay | Source: Home / Self Care | Attending: Internal Medicine

## 2018-01-07 ENCOUNTER — Encounter (HOSPITAL_COMMUNITY): Payer: Self-pay | Admitting: *Deleted

## 2018-01-07 ENCOUNTER — Other Ambulatory Visit: Payer: Self-pay

## 2018-01-07 DIAGNOSIS — K3184 Gastroparesis: Secondary | ICD-10-CM

## 2018-01-07 DIAGNOSIS — C259 Malignant neoplasm of pancreas, unspecified: Secondary | ICD-10-CM

## 2018-01-07 DIAGNOSIS — R112 Nausea with vomiting, unspecified: Secondary | ICD-10-CM

## 2018-01-07 DIAGNOSIS — E119 Type 2 diabetes mellitus without complications: Secondary | ICD-10-CM

## 2018-01-07 DIAGNOSIS — N400 Enlarged prostate without lower urinary tract symptoms: Secondary | ICD-10-CM

## 2018-01-07 DIAGNOSIS — R109 Unspecified abdominal pain: Secondary | ICD-10-CM

## 2018-01-07 DIAGNOSIS — K269 Duodenal ulcer, unspecified as acute or chronic, without hemorrhage or perforation: Secondary | ICD-10-CM

## 2018-01-07 DIAGNOSIS — K210 Gastro-esophageal reflux disease with esophagitis: Secondary | ICD-10-CM

## 2018-01-07 DIAGNOSIS — K228 Other specified diseases of esophagus: Secondary | ICD-10-CM

## 2018-01-07 HISTORY — PX: ESOPHAGOGASTRODUODENOSCOPY: SHX5428

## 2018-01-07 LAB — C DIFFICILE QUICK SCREEN W PCR REFLEX
C DIFFICILE (CDIFF) INTERP: NOT DETECTED
C DIFFICILE (CDIFF) TOXIN: NEGATIVE
C Diff antigen: NEGATIVE

## 2018-01-07 LAB — GLUCOSE, CAPILLARY
GLUCOSE-CAPILLARY: 157 mg/dL — AB (ref 65–99)
GLUCOSE-CAPILLARY: 155 mg/dL — AB (ref 65–99)
GLUCOSE-CAPILLARY: 150 mg/dL — AB (ref 65–99)
Glucose-Capillary: 124 mg/dL — ABNORMAL HIGH (ref 65–99)
Glucose-Capillary: 140 mg/dL — ABNORMAL HIGH (ref 65–99)
Glucose-Capillary: 152 mg/dL — ABNORMAL HIGH (ref 65–99)
Glucose-Capillary: 167 mg/dL — ABNORMAL HIGH (ref 65–99)

## 2018-01-07 LAB — DIFFERENTIAL
Basophils Absolute: 0 10*3/uL (ref 0.0–0.1)
Basophils Relative: 0 %
EOS PCT: 1 %
Eosinophils Absolute: 0.1 10*3/uL (ref 0.0–0.7)
LYMPHS ABS: 1 10*3/uL (ref 0.7–4.0)
LYMPHS PCT: 9 %
MONO ABS: 0.8 10*3/uL (ref 0.1–1.0)
Monocytes Relative: 8 %
Neutro Abs: 9.4 10*3/uL — ABNORMAL HIGH (ref 1.7–7.7)
Neutrophils Relative %: 82 %
WBC Morphology: INCREASED

## 2018-01-07 LAB — CBC
HCT: 27.5 % — ABNORMAL LOW (ref 39.0–52.0)
Hemoglobin: 9.1 g/dL — ABNORMAL LOW (ref 13.0–17.0)
MCH: 31.1 pg (ref 26.0–34.0)
MCHC: 33.1 g/dL (ref 30.0–36.0)
MCV: 93.9 fL (ref 78.0–100.0)
PLATELETS: 34 10*3/uL — AB (ref 150–400)
RBC: 2.93 MIL/uL — ABNORMAL LOW (ref 4.22–5.81)
RDW: 20.1 % — AB (ref 11.5–15.5)
WBC: 11.5 10*3/uL — AB (ref 4.0–10.5)

## 2018-01-07 LAB — BASIC METABOLIC PANEL
Anion gap: 6 (ref 5–15)
BUN: 17 mg/dL (ref 6–20)
CO2: 24 mmol/L (ref 22–32)
CREATININE: 1.01 mg/dL (ref 0.61–1.24)
Calcium: 7.8 mg/dL — ABNORMAL LOW (ref 8.9–10.3)
Chloride: 103 mmol/L (ref 101–111)
GFR calc Af Amer: >60 mL/min (ref >60)
GLUCOSE: 156 mg/dL — AB (ref 65–99)
Potassium: 3.5 mmol/L (ref 3.5–5.1)
SODIUM: 133 mmol/L — AB (ref 135–145)

## 2018-01-07 SURGERY — EGD (ESOPHAGOGASTRODUODENOSCOPY)
Anesthesia: Moderate Sedation

## 2018-01-07 MED ORDER — LIDOCAINE VISCOUS HCL 2 % MT SOLN
OROMUCOSAL | Status: DC | PRN
  Administered 2018-01-07: 4 mL via OROMUCOSAL

## 2018-01-07 MED ORDER — MEPERIDINE HCL 50 MG/ML IJ SOLN
INTRAMUSCULAR | Status: AC
  Filled 2018-01-07: qty 1

## 2018-01-07 MED ORDER — MEPERIDINE HCL 50 MG/ML IJ SOLN
INTRAMUSCULAR | Status: DC | PRN
  Administered 2018-01-07 (×2): 25 mg via INTRAVENOUS

## 2018-01-07 MED ORDER — METOCLOPRAMIDE HCL 5 MG/ML IJ SOLN
5.0000 mg | Freq: Four times a day (QID) | INTRAMUSCULAR | Status: DC
  Administered 2018-01-07 – 2018-01-08 (×5): 5 mg via INTRAVENOUS
  Filled 2018-01-07 (×5): qty 2

## 2018-01-07 MED ORDER — SODIUM CHLORIDE 0.9 % IV SOLN
INTRAVENOUS | Status: DC
  Administered 2018-01-07: 11:00:00 via INTRAVENOUS

## 2018-01-07 MED ORDER — MIDAZOLAM HCL 5 MG/5ML IJ SOLN
INTRAMUSCULAR | Status: DC | PRN
  Administered 2018-01-07 (×2): 2 mg via INTRAVENOUS

## 2018-01-07 MED ORDER — LIDOCAINE VISCOUS HCL 2 % MT SOLN
OROMUCOSAL | Status: AC
  Filled 2018-01-07: qty 15

## 2018-01-07 MED ORDER — MIDAZOLAM HCL 5 MG/5ML IJ SOLN
INTRAMUSCULAR | Status: AC
Start: 2018-01-07 — End: 2018-01-07
  Filled 2018-01-07: qty 10

## 2018-01-07 MED ORDER — STERILE WATER FOR IRRIGATION IR SOLN
Status: DC | PRN
  Administered 2018-01-07: 11:00:00

## 2018-01-07 NOTE — Progress Notes (Signed)
Patient place on Enteric Isolation per Infection control policy. Patient and family educated,and given educational handouts on Enteric isolation, verbalized understanding. Will continue to monitor patient.

## 2018-01-07 NOTE — Progress Notes (Signed)
  EGD note.  Extensive ulceration to distal half of esophagus. Bilious fluid  And the stomach with patent pylorus. Extensive ulceration to second and third part of the duodenum. No stricture noted. Due to the injury secondary to chemotherapy. Esophageal injury secondary to secondary reflux.

## 2018-01-07 NOTE — Progress Notes (Signed)
PROGRESS NOTE    Albert Richards  UYQ:[id number] DOB: [birth year] DOA: 01/04/2018 PCP: [name], MD   Brief Narrative:   Albert Richards  is a 77 y.o. 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  Patient is currently getting chemotherapy for pancreatic cancer, last chemo was on Tuesday after that patient became nauseated and had abdominal pain.  He has been started on symptomatic treatment with IV fluid and Zofran with minimal relief. CT scan this morning demonstrates some duodenitis which is likely contributing to the process.  He has received an NG tube yesterday with significant relief of his symptoms, but has managed to pull this out twice.  He has received some Phenergan and has become quite agitated as a result of this medication.  No further nausea or vomiting symptoms noted this morning and GI has seen patient with recommendations for advancement to clear liquid diet at this time.  His leukocytosis is steadily improving on IV Zosyn.   Assessment & Plan:   Active Problems:   Stage IV adenocarcinoma of pancreas (HCC)   COPD (chronic obstructive pulmonary disease) (HCC)   Diabetes mellitus (HCC)   Small bowel perforation s/p SB resection 07/11/2016   Abdominal pain   1. Intractable nausea and vomiting with epigastric abdominal pain likely secondary to duodenitis.  EGD r/o stenosis and demonstrated severe esophagitis and duodenitis; most likely associated with reflux and chemotherapy. Will continue PPI, start reglan and slowly advance diet. 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 2. Severe leukocytosis-improving.  There is some initial thought that perhaps steroids and Neulasta may have contributed to this process but I have spoken with Dr. Tressie Stalker his oncologist who states that he has never had a white cell count this elevated.  Improvement  seen on his WBC's. Has remained afebrile, no source of infection identified. Continue empiric abx's for another 24 hours.  3. 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  These are not new per CT scan as comparison was in 2017 and he has had multiple repeat CT scans in 2018.  Currently on chemoradiation treatments at [facility].continue outpatient follow up with oncologist.  4. Type 2 diabetes with hyperglycemia.  Continue sliding scale insulin for management. Follow CBG's and adjust hypoglycemic regimen as needed.  5. Thrombocytopenia-low but stable; most likely associated with chemotherapy. No heparin products, follow trend.  6.   BPH: continue flomax.   DVT prophylaxis:SCDs Code Status: DNR Family Communication: Wife and son at bedside Disposition Plan: continue CLD; start reglan as recommended by GI, follow clinical response and slowly advance diet.    Consultants:   GI called-Dr. Laural Golden  Procedures:   EGD  Antimicrobials:   None   Subjective: Afebrile, feeling better and denying CP or SOB. Tolerated EGD well and currently without nausea or vomiting.   Objective: Vitals:   01/07/18 1120 01/07/18 1125 01/07/18 1202 01/07/18 1407  BP: 120/79  91/71 129/79  Pulse: 92 91 80 67  Resp: 18 (!) 28 16 20   Temp:   97.8 F (36.6 C) 98.1 F (36.7 C)  TempSrc:      SpO2: 97% 97% 100% 100%  Weight:      Height:        Intake/Output Summary (Last 24 hours) at 01/07/2018 2153 Last data filed at 01/07/2018 1818 Gross per 24 hour  Intake 3174.79 ml  Output -  Net 3174.79 ml   [employer]  01/04/18 1049 01/04/18 2257  Weight: 88.5 kg (195 lb) 85 kg (187 lb 6.3 oz)    Examination:  General exam: Alert, awake, oriented x 3; feeling better and reported less anxious after xanax.  Respiratory system: Clear to auscultation. Respiratory effort normal. Cardiovascular system:RRR. No murmurs, rubs, gallops. Gastrointestinal system: Abdomen is nondistended, soft  and reported only mildly tender on palpation (mid epigastric region). No organomegaly or masses felt. Positive BS. Central nervous system: Alert and oriented. No focal neurological deficits. Extremities: No C/C/E, +pedal pulses Skin: No rashes, lesions or ulcers Psychiatry: Judgement and insight appear normal. No SI.     Data Reviewed: I have personally reviewed following labs and imaging studies  CBC: Recent Labs  Lab 01/04/18 1225 01/05/18 0426 01/06/18 0721 01/07/18 0728  WBC 61.8* 51.2* 40.8* 11.5*  NEUTROABS 60.0  --   --  9.4*  HGB 10.6* 10.2* 10.4* 9.1*  HCT 32.6* 30.9* 31.7* 27.5*  MCV 93.7 94.0 92.7 93.9  PLT 88* 71* 53* 34*   Basic Metabolic Panel: Recent Labs  Lab 01/04/18 1225 01/05/18 0426 01/06/18 0721 01/07/18 0728  NA 131* 132* 133* 133*  K 3.7 3.6 3.9 3.5  CL 95* 98* 101 103  CO2 27 26 23 24   GLUCOSE 192* 177* 211* 156*  BUN 25* 19 19 17   CREATININE 0.94 0.89 0.99 1.01  CALCIUM 8.4* 7.9* 8.0* 7.8*   GFR: Estimated Creatinine Clearance: 63.2 mL/min (by C-G formula based on SCr of 1.01 mg/dL).   Liver Function Tests: Recent Labs  Lab 01/04/18 1225 01/05/18 0426 01/06/18 0721  AST 26 19 15   ALT 29 25 23   ALKPHOS 126 133* 153*  BILITOT 0.8 0.8 0.8  PROT 5.8* 5.3* 5.4*  ALBUMIN 2.9* 2.7* 2.7*   CBG: Recent Labs  Lab 01/07/18 0747 01/07/18 1034 01/07/18 1200 01/07/18 1602 01/07/18 2030  GLUCAP 152* 155* 150* 140* 167*    Recent Results (from the past 240 hour(s))  C difficile quick scan w PCR reflex     Status: None   Collection Time: 01/07/18  7:30 PM  Result Value Ref Range Status   C Diff antigen NEGATIVE NEGATIVE Final   C Diff toxin NEGATIVE NEGATIVE Final   C Diff interpretation No C. difficile detected.  Final    Comment: Performed at [facility], [address], [place], [place] [address]     Scheduled Meds: . feeding supplement (ENSURE ENLIVE)  237 mL Oral TID BM  . insulin aspart  0-15 Units Subcutaneous Q4H  .  levothyroxine  75 mcg Intravenous Daily  . lidocaine      . meperidine      . metoCLOPramide (REGLAN) injection  5 mg Intravenous Q6H  . midazolam      . pantoprazole  40 mg Oral Daily  . tamsulosin  0.4 mg Oral BH-q7a   Continuous Infusions: . sodium chloride 100 mL/hr at 01/06/18 0936  . famotidine (PEPCID) IV Stopped (01/07/18 2101)  . piperacillin-tazobactam (ZOSYN)  IV Stopped (01/07/18 2101)     LOS: 2 days    Time spent: 30 minutes    Barton Dubois, MD [facility] Pager [phone]  If 7PM-7AM, please contact night-coverage [email or web address] Password [facility] 01/07/2018, 9:53 PM

## 2018-01-07 NOTE — Op Note (Signed)
[facility] Patient Name: Albert Richards Procedure Date: 01/07/2018 10:43 AM MRN: [id number] Date of Birth: Jun 02, 1941 Attending MD: Hildred Laser , MD CSN: [id number] Age: 77 Admit Type: Inpatient Procedure:                Upper GI endoscopy Indications:              Abnormal CT of the GI tract, Nausea with vomiting Providers:                Hildred Laser, MD, Otis Peak B. Sharon Seller, RN, Aram Candela Referring MD:             Heath Lark, DO Medicines:                Lidocaine spray, Meperidine 50 mg IV, Midazolam 4                            mg IV Complications:            No immediate complications. Estimated Blood Loss:     Estimated blood loss: none. Procedure:                Pre-Anesthesia Assessment:                           - Prior to the procedure, a History and Physical                            was performed, and patient medications and                            allergies were reviewed. The patient's tolerance of                            previous anesthesia was also reviewed. The risks                            and benefits of the procedure and the sedation                            options and risks were discussed with the patient.                            All questions were answered, and informed consent                            was obtained. Prior Anticoagulants: The patient                            last took Eliquis (apixaban) 4 days and previous                            NSAID medication 4 days prior to the procedure. ASA  Grade Assessment: III - A patient with severe                            systemic disease. After reviewing the risks and                            benefits, the patient was deemed in satisfactory                            condition to undergo the procedure.                           After obtaining informed consent, the endoscope was                            passed under direct  vision. Throughout the                            procedure, the patient's blood pressure, pulse, and                            oxygen saturations were monitored continuously. The                            [id number] ([id number]) scope was introduced through the                            mouth, and advanced to the third part of duodenum.                            The upper GI endoscopy was accomplished without                            difficulty. The patient tolerated the procedure                            well. Scope In: 11:13:55 AM Scope Out: 11:19:22 AM Total Procedure Duration: 0 hours 5 minutes 27 seconds  Findings:      The proximal esophagus was normal.      LA Grade D (one or more mucosal breaks involving at least 75% of       esophageal circumference) esophagitis with no bleeding was found 30 to       40 cm from the incisors.      The Z-line was irregular and was found 40 cm from the incisors.      Bilious fluid was found in the gastric fundus and in the gastric body.      Pylorus patent.      The duodenal bulb was normal.      Many non-bleeding superficial duodenal ulcers with no stigmata of       bleeding were found in the second portion of the duodenum and in the       third portion of the duodenum. The largest lesion was 1 mm in largest       dimension. Impression:               -  Normal proximal esophagus.                           - LA Grade D reflux esophagitis.                           - Z-line irregular, 40 cm from the incisors.                           - Bilious gastric fluid.                           - Normal stomach.                           - Normal duodenal bulb.                           - Multiple non-bleeding duodenal ulcers with no                            stigmata of bleeding.                           - No specimens collected.                           Comment: duodenitis sec to chemotherapy. Biopsy not                            taken because  of thrombocytopenia. Moderate Sedation:      Moderate (conscious) sedation was administered by the endoscopy nurse       and supervised by the endoscopist. The following parameters were       monitored: oxygen saturation, heart rate, blood pressure, CO2       capnography and response to care. Total physician intraservice time was       11 minutes. Recommendation:           - Return patient to hospital ward for ongoing care.                           - Full liquid diet today.                           - Continue present medications.                           - Metoclopromide 5 mg IV q 6h.                           - GI Path panel and stool for c.diff since patient                            now having diarrhea. Procedure Code(s):        --- Professional ---  72536, Esophagogastroduodenoscopy, flexible,                            transoral; diagnostic, including collection of                            specimen(s) by brushing or washing, when performed                            (separate procedure)                           G0500, Moderate sedation services provided by the                            same physician or other qualified health care                            professional performing a gastrointestinal                            endoscopic service that sedation supports,                            requiring the presence of an independent trained                            observer to assist in the monitoring of the                            patient's level of consciousness and physiological                            status; initial 15 minutes of intra-service time;                            patient age 60 years or older (additional time may                            be reported with [phone], as appropriate) Diagnosis Code(s):        --- Professional ---                           K21.0, Gastro-esophageal reflux disease with                             esophagitis                           K22.8, Other specified diseases of esophagus                           K26.9, Duodenal ulcer, unspecified as acute or                            chronic, without hemorrhage  or perforation                           R11.2, Nausea with vomiting, unspecified                           R93.3, Abnormal findings on diagnostic imaging of                            other parts of digestive tract CPT copyright 2017 [employer]. All rights reserved. The codes documented in this report are preliminary and upon coder review may  be revised to meet current compliance requirements. Hildred Laser, MD Hildred Laser, MD 01/07/2018 11:46:16 AM This report has been signed electronically. Number of Addenda: 0

## 2018-01-07 NOTE — Care Management Important Message (Signed)
Important Message  Patient Details  Name: Albert Richards MRN: [id number] Date of Birth: 24-Sep-1940   Medicare Important Message Given:  Yes    Shelda Altes 01/07/2018, 11:51 AM

## 2018-01-08 LAB — CBC WITH DIFFERENTIAL/PLATELET
Basophils Absolute: 0 10*3/uL (ref 0.0–0.1)
Basophils Relative: 0 %
EOS ABS: 0.1 10*3/uL (ref 0.0–0.7)
EOS PCT: 2 %
HCT: 27.5 % — ABNORMAL LOW (ref 39.0–52.0)
Hemoglobin: 9 g/dL — ABNORMAL LOW (ref 13.0–17.0)
Lymphocytes Relative: 12 %
Lymphs Abs: 0.8 10*3/uL (ref 0.7–4.0)
MCH: 30.6 pg (ref 26.0–34.0)
MCHC: 32.7 g/dL (ref 30.0–36.0)
MCV: 93.5 fL (ref 78.0–100.0)
Monocytes Absolute: 0.5 10*3/uL (ref 0.1–1.0)
Monocytes Relative: 6 %
Neutro Abs: 5.7 10*3/uL (ref 1.7–7.7)
Neutrophils Relative %: 80 %
PLATELETS: 39 10*3/uL — AB (ref 150–400)
RBC: 2.94 MIL/uL — AB (ref 4.22–5.81)
RDW: 20.1 % — AB (ref 11.5–15.5)
WBC: 7.1 10*3/uL (ref 4.0–10.5)

## 2018-01-08 LAB — GLUCOSE, CAPILLARY
GLUCOSE-CAPILLARY: 116 mg/dL — AB (ref 65–99)
Glucose-Capillary: 125 mg/dL — ABNORMAL HIGH (ref 65–99)

## 2018-01-08 MED ORDER — FAMOTIDINE 20 MG PO TABS
20.0000 mg | ORAL_TABLET | Freq: Every day | ORAL | Status: DC
  Administered 2018-01-08: 20 mg via ORAL
  Filled 2018-01-08: qty 1

## 2018-01-08 MED ORDER — PANTOPRAZOLE SODIUM 40 MG PO TBEC
40.0000 mg | DELAYED_RELEASE_TABLET | Freq: Two times a day (BID) | ORAL | Status: DC
  Administered 2018-01-08 – 2018-01-09 (×2): 40 mg via ORAL
  Filled 2018-01-08 (×2): qty 1

## 2018-01-08 MED ORDER — LOPERAMIDE HCL 2 MG PO CAPS
2.0000 mg | ORAL_CAPSULE | Freq: Three times a day (TID) | ORAL | Status: DC
  Administered 2018-01-08 – 2018-01-09 (×3): 2 mg via ORAL
  Filled 2018-01-08 (×3): qty 1

## 2018-01-08 MED ORDER — METOCLOPRAMIDE HCL 5 MG/ML IJ SOLN
5.0000 mg | Freq: Three times a day (TID) | INTRAMUSCULAR | Status: DC
  Administered 2018-01-08 – 2018-01-09 (×2): 5 mg via INTRAVENOUS
  Filled 2018-01-08 (×2): qty 2

## 2018-01-08 MED ORDER — SACCHAROMYCES BOULARDII 250 MG PO CAPS
250.0000 mg | ORAL_CAPSULE | Freq: Two times a day (BID) | ORAL | Status: DC
  Administered 2018-01-08 – 2018-01-09 (×2): 250 mg via ORAL
  Filled 2018-01-08 (×2): qty 1

## 2018-01-08 MED ORDER — LEVOTHYROXINE SODIUM 75 MCG PO TABS
150.0000 ug | ORAL_TABLET | Freq: Every day | ORAL | Status: DC
  Administered 2018-01-09: 150 ug via ORAL
  Filled 2018-01-08: qty 2

## 2018-01-08 MED ORDER — LOPERAMIDE HCL 2 MG PO CAPS
2.0000 mg | ORAL_CAPSULE | Freq: Once | ORAL | Status: AC
  Administered 2018-01-08: 2 mg via ORAL
  Filled 2018-01-08: qty 1

## 2018-01-08 NOTE — Progress Notes (Signed)
  Subjective:  Patient is not having nausea or vomiting anymore but he has been having diarrhea since last night.  He has been passing loose stools.  He has had 3 stools today.  He feels he is doing better with loperamide which was started earlier today.  He denies heartburn or chest pain.  Objective: Blood pressure 120/71, pulse 71, temperature 98.3 F (36.8 C), temperature source Oral, resp. rate 18, height 5\' 10"  (1.778 m), weight 187 lb 6.3 oz (85 kg), SpO2 97 %. Patient is alert and in no acute distress. Domino exam reveals normal bowel sounds and soft abdomen without tenderness organomegaly or masses.  Labs/studies Results:  Recent Labs    January 23, 2018 0721 01/07/18 0728 01/08/18 0821  WBC 40.8* 11.5* 7.1  HGB 10.4* 9.1* 9.0*  HCT 31.7* 27.5* 27.5*  PLT 53* 34* 39*    BMET  Recent Labs    01-23-18 0721 01/07/18 0728  NA 133* 133*  K 3.9 3.5  CL 101 103  CO2 23 24  GLUCOSE 211* 156*  BUN 19 17  CREATININE 0.99 1.01  CALCIUM 8.0* 7.8*    LFT  Recent Labs    2018/01/23 0721  PROT 5.4*  ALBUMIN 2.7*  AST 15  ALT 23  ALKPHOS 153*  BILITOT 0.8    C. difficile antigen and toxin negative. Pathogen panel pending.  Assessment:  #1.  Nausea and vomiting secondary to chemotherapy-induced small bowel injury documented on EGD yesterday.  Patient is doing much better with combination of PPI and low-dose metoclopramide.  #2.  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  She is now on PPI twice daily and famotidine at bedtime.  #3.  Diarrhea.  Diarrhea appears to be secondary to small bowel injury secondary to chemotherapy.  Symptomatic improvement with loperamide.  GI pathogen panel is pending.  C. difficile testing negative.  #4.  Metastatic pancreatic carcinoma.  Patient is status post FOLFIRI [place] last week.  He also received Neulasta and Decadron.  Leukocytosis has resolved.  Thrombocytopenia secondary to  chemotherapy.  Platelet count has improved somewhat in the last 24 hours.   Recommendations:  Loperamide 2 mg p.o. 3 times daily. Continue aggressive therapy for GERD. Continue IV Reglan for another 24 hours. CBC with differential and metabolic 7 in a.m.

## 2018-01-08 NOTE — Progress Notes (Signed)
Patient and his wife are concerned about loose bowel movements. Patient's wife has called Dr. Laural Golden about loose stool. Patient and his wife has agreed he does not want blood sugar checks nor insulin. Patient wife state "Dr. Laural Golden will start him on imodium".

## 2018-01-08 NOTE — Progress Notes (Signed)
PROGRESS NOTE    Albert Richards  TDD:[id number] DOB: Jul 17, 1941 DOA: 01/04/2018 PCP: [name], MD   Brief Narrative:   Albert Richards  is a 77 y.o. 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  Patient is currently getting chemotherapy for pancreatic cancer, last chemo was on Tuesday after that patient became nauseated and had abdominal pain.  He has been started on symptomatic treatment with IV fluid and Zofran with minimal relief. CT scan this morning demonstrates some duodenitis which is likely contributing to the process.  He has received an NG tube yesterday with significant relief of his symptoms, but has managed to pull this out twice.  He has received some Phenergan and has become quite agitated as a result of this medication.  No further nausea or vomiting symptoms noted this morning and GI has seen patient with recommendations for advancement to clear liquid diet at this time.  His leukocytosis is steadily improving on IV Zosyn.   Assessment & Plan:   Active Problems:   Stage IV adenocarcinoma of pancreas (HCC)   COPD (chronic obstructive pulmonary disease) (HCC)   Diabetes mellitus (HCC)   Small bowel perforation s/p SB resection 07/11/2016   Abdominal pain   1. Intractable nausea and vomiting with epigastric abdominal pain likely secondary to duodenitis.  EGD r/o stenosis and demonstrated severe esophagitis and duodenitis; most likely associated with reflux and chemotherapy. Will continue PPI, continue reglan and advance diet. 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 2. Severe leukocytosis-improving.  There is some initial thought that perhaps steroids and Neulasta may have contributed to this process but I have spoken with Dr. Tressie Stalker his oncologist who states that he has never had a white cell count this elevated.  Improvement seen  on his WBC's. Has remained afebrile, no source of infection identified. Will stop antibiotics today and follow clinical response.   3. 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  These are not new per CT scan as comparison was in 2017 and he has had multiple repeat CT scans in 2018.  Currently on chemoradiation treatments at [facility].continue outpatient follow up with oncologist.  4. Type 2 diabetes with hyperglycemia. Patient has decline CBG's check and insulin therapy. Will stop SSI. 5. Thrombocytopenia-has remained low but stable; most likely associated with chemotherapy. No heparin products, follow trend.  6.   BPH: continue flomax.  7.   Diarrhea: appears to be non infectious in origin. Will stop antibiotics, start florastor, and following GI rec's will use imodium   DVT prophylaxis:SCDs Code Status: DNR Family Communication: Wife and son at bedside Disposition Plan: continue CLD; start reglan as recommended by GI, follow clinical response and slowly advance diet.    Consultants:   GI called-Dr. Laural Golden  Procedures:   EGD  Antimicrobials:   None  Subjective: No fever, overall feeling better, denies chest pain and shortness of breath.  Still having diarrhea.  No nausea or further vomiting.  Objective: Vitals:   01/07/18 1202 01/07/18 1407 01/07/18 2200 01/08/18 0545  BP: 91/71 129/79 126/74 117/68  Pulse: 80 67 80 65  Resp: 16 20 18 18   Temp: 97.8 F (36.6 C) 98.1 F (36.7 C) 97.6 F (36.4 C) 98 F (36.7 C)  TempSrc:      SpO2: 100% 100% 99% 97%  Weight:      Height:        Intake/Output Summary (Last  24 hours) at 01/08/2018 1313 Last data filed at 01/08/2018 1000 Gross per 24 hour  Intake 4434.79 ml  Output 650 ml  Net 3784.79 ml   Filed Weights   01/04/18 1049 01/04/18 2257  Weight: 88.5 kg (195 lb) 85 kg (187 lb 6.3 oz)    Examination:  General exam: Alert, awake, oriented x 3; denies CP and SOB. Still having diarrhea; no further  nausea or vomiting reported.  Respiratory system: Clear to auscultation. Respiratory effort normal. Cardiovascular system:RRR. No murmurs, rubs, gallops. Gastrointestinal system: Abdomen is nondistended, soft and nontender. No organomegaly or masses felt. Normal bowel sounds heard. Central nervous system: Alert and oriented. No focal neurological deficits. Extremities: No C/C/E, +pedal pulses Skin: No rashes, lesions or ulcers Psychiatry: Judgement and insight appear normal. Mood & affect appropriate.    Data Reviewed: I have personally reviewed following labs and imaging studies  CBC: Recent Labs  Lab 01/04/18 1225 01/05/18 0426 01/06/18 0721 01/07/18 0728 01/08/18 0821  WBC 61.8* 51.2* 40.8* 11.5* 7.1  NEUTROABS 60.0  --   --  9.4* 5.7  HGB 10.6* 10.2* 10.4* 9.1* 9.0*  HCT 32.6* 30.9* 31.7* 27.5* 27.5*  MCV 93.7 94.0 92.7 93.9 93.5  PLT 88* 71* 53* 34* 39*   Basic Metabolic Panel: Recent Labs  Lab 01/04/18 1225 01/05/18 0426 01/06/18 0721 01/07/18 0728  NA 131* 132* 133* 133*  K 3.7 3.6 3.9 3.5  CL 95* 98* 101 103  CO2 27 26 23 24   GLUCOSE 192* 177* 211* 156*  BUN 25* 19 19 17   CREATININE 0.94 0.89 0.99 1.01  CALCIUM 8.4* 7.9* 8.0* 7.8*   GFR: Estimated Creatinine Clearance: 63.2 mL/min (by C-G formula based on SCr of 1.01 mg/dL).   Liver Function Tests: Recent Labs  Lab 01/04/18 1225 01/05/18 0426 01/06/18 0721  AST 26 19 15   ALT 29 25 23   ALKPHOS 126 133* 153*  BILITOT 0.8 0.8 0.8  PROT 5.8* 5.3* 5.4*  ALBUMIN 2.9* 2.7* 2.7*   CBG: Recent Labs  Lab 01/07/18 1200 01/07/18 1602 01/07/18 2030 01/08/18 0028 01/08/18 0543  GLUCAP 150* 140* 167* 116* 125*    Recent Results (from the past 240 hour(s))  C difficile quick scan w PCR reflex     Status: None   Collection Time: 01/07/18  7:30 PM  Result Value Ref Range Status   C Diff antigen NEGATIVE NEGATIVE Final   C Diff toxin NEGATIVE NEGATIVE Final   C Diff interpretation No C. difficile  detected.  Final    Comment: Performed at [facility], [address] [place], [place] [address]     Scheduled Meds: . famotidine  20 mg Oral QHS  . feeding supplement (ENSURE ENLIVE)  237 mL Oral TID BM  . [START ON 01/09/2018] levothyroxine  150 mcg Oral QAC breakfast  . loperamide  2 mg Oral TID  . metoCLOPramide (REGLAN) injection  5 mg Intravenous Q8H  . pantoprazole  40 mg Oral BID  . saccharomyces boulardii  250 mg Oral BID  . tamsulosin  0.4 mg Oral BH-q7a   Continuous Infusions: . sodium chloride 100 mL/hr at 01/06/18 0936     LOS: 3 days    Time spent: 30 minutes    Barton Dubois, MD [facility] Pager [phone]  If 7PM-7AM, please contact night-coverage [email or web address] Password [email or web address] 01/08/2018, 1:13 PM

## 2018-01-09 DIAGNOSIS — K3184 Gastroparesis: Secondary | ICD-10-CM

## 2018-01-09 DIAGNOSIS — E876 Hypokalemia: Secondary | ICD-10-CM

## 2018-01-09 DIAGNOSIS — D72829 Elevated white blood cell count, unspecified: Secondary | ICD-10-CM

## 2018-01-09 DIAGNOSIS — K209 Esophagitis, unspecified without bleeding: Secondary | ICD-10-CM

## 2018-01-09 DIAGNOSIS — E039 Hypothyroidism, unspecified: Secondary | ICD-10-CM

## 2018-01-09 DIAGNOSIS — K2960 Other gastritis without bleeding: Secondary | ICD-10-CM

## 2018-01-09 LAB — GASTROINTESTINAL PANEL BY PCR, STOOL (REPLACES STOOL CULTURE)

## 2018-01-09 LAB — BASIC METABOLIC PANEL
ANION GAP: 6 (ref 5–15)
BUN: 6 mg/dL (ref 6–20)
CALCIUM: 7.5 mg/dL — AB (ref 8.9–10.3)
CO2: 23 mmol/L (ref 22–32)
Chloride: 105 mmol/L (ref 101–111)
Creatinine, Ser: 0.9 mg/dL (ref 0.61–1.24)
GFR calc Af Amer: >60 mL/min (ref >60)
GLUCOSE: 152 mg/dL — AB (ref 65–99)
POTASSIUM: 3 mmol/L — AB (ref 3.5–5.1)
SODIUM: 134 mmol/L — AB (ref 135–145)

## 2018-01-09 LAB — CBC
HCT: 26.2 % — ABNORMAL LOW (ref 39.0–52.0)
Hemoglobin: 8.5 g/dL — ABNORMAL LOW (ref 13.0–17.0)
MCH: 30.5 pg (ref 26.0–34.0)
MCHC: 32.4 g/dL (ref 30.0–36.0)
MCV: 93.9 fL (ref 78.0–100.0)
PLATELETS: 48 10*3/uL — AB (ref 150–400)
RBC: 2.79 MIL/uL — AB (ref 4.22–5.81)
RDW: 20.1 % — ABNORMAL HIGH (ref 11.5–15.5)
WBC: 4.8 10*3/uL (ref 4.0–10.5)

## 2018-01-09 MED ORDER — SACCHAROMYCES BOULARDII 250 MG PO CAPS: 250.0000 mg | ORAL_CAPSULE | Freq: Two times a day (BID) | ORAL | 0 refills | Status: AC

## 2018-01-09 MED ORDER — PANTOPRAZOLE SODIUM 40 MG PO TBEC: 40.0000 mg | DELAYED_RELEASE_TABLET | Freq: Two times a day (BID) | ORAL | 1 refills | Status: AC

## 2018-01-09 MED ORDER — POTASSIUM CHLORIDE ER 10 MEQ PO TBCR: 20.0000 meq | EXTENDED_RELEASE_TABLET | Freq: Two times a day (BID) | ORAL | 0 refills | Status: AC

## 2018-01-09 MED ORDER — METOCLOPRAMIDE HCL 5 MG PO TABS: 5.0000 mg | ORAL_TABLET | Freq: Three times a day (TID) | ORAL | 0 refills | Status: AC

## 2018-01-09 MED ORDER — DIPHENHYDRAMINE HCL 25 MG PO CAPS
25.0000 mg | ORAL_CAPSULE | Freq: Every evening | ORAL | Status: DC | PRN
  Administered 2018-01-09: 25 mg via ORAL
  Filled 2018-01-09: qty 1

## 2018-01-09 MED ORDER — METOCLOPRAMIDE HCL 10 MG PO TABS: 5.0000 mg | ORAL_TABLET | Freq: Three times a day (TID) | ORAL | Status: DC

## 2018-01-09 NOTE — Progress Notes (Signed)
  Subjective:  Patient has no complaints.  He denies nausea vomiting or heartburn.  He is eating food from outside.  He only has had one bowel movement since this morning.  Was small volume and not as watery.  Objective: Blood pressure 127/80, pulse 77, temperature 98.4 F (36.9 C), temperature source Oral, resp. rate 18, height 5\' 10"  (1.778 m), weight 187 lb 6.3 oz (85 kg), SpO2 95 %. Patient is alert and in no acute distress. Abdomen is soft and nontender with organomegaly or masses.  Labs/studies Results:  Recent Labs    2018/01/22 0728 01/08/18 0821 01/09/18 0456  WBC 11.5* 7.1 4.8  HGB 9.1* 9.0* 8.5*  HCT 27.5* 27.5* 26.2*  PLT 34* 39* 48*    BMET  Recent Labs    January 22, 2018 0728 01/09/18 0456  NA 133* 134*  K 3.5 3.0*  CL 103 105  CO2 24 23  GLUCOSE 156* 152*  BUN 17 6  CREATININE 1.01 0.90  CALCIUM 7.8* 7.5*    LFT  No results for input(s): PROT, ALBUMIN, AST, ALT, ALKPHOS, BILITOT, BILIDIR, IBILI in the last 72 hours.  PT/INR  No results for input(s): LABPROT, INR in the last 72 hours.  Hepatitis Panel  No results for input(s): HEPBSAG, HCVAB, HEPAIGM, HEPBIGM in the last 72 hours.   Assessment:  #1.  Nausea and vomiting secondary to severe duodenitis and enteritis due to chemotherapy. GI pathogen panel is negative.  Symptomatic improvement with antidiarrheal.  Diarrhea should improve as small bowel injury resolves.  #2Sammuel Richards reflux esophagitis.  Patient is doing with PPI and metoclopramide.  #3.  Anemia and thrombocytopenia secondary to chemotherapy.  #4.  Hypokalemia.  Has history of hypokalemia and has been on KCl outpatient.  He will need to double up the dose for a week or so.   Recommendations:  Transition metoclopramide to 5 mg before each meal. Should continue it for at least 2 weeks. Continue pantoprazole at 40 mg p.o. twice daily. Imodium OTC 2 mg p.o. 3 times daily PRN. Patient stable for discharge from GI standpoint.  Discussed with  Dr. Dyann Kief. We will plan to see patient in the office in 1 month.

## 2018-01-09 NOTE — Care Management Important Message (Signed)
Important Message  Patient Details  Name: Albert Richards MRN: [id number] Date of Birth: March 11, 1941   Medicare Important Message Given:  Yes    Sherald Barge, RN 01/09/2018, 2:05 PM

## 2018-01-09 NOTE — Discharge Summary (Signed)
Physician Discharge Summary  Albert Richards BDZ:[id number] DOB: [birth year] DOA: 01/04/2018  PCP: [name], MD  Admit date: 01/04/2018 Discharge date: 01/09/2018  Time spent: 35 minutes  Recommendations for Outpatient Follow-up:  1. Repeat CBC to follow hemoglobin, WBCs and platelets trend 2. Repeat basic metabolic panel to follow electrolytes and renal function   Discharge Diagnoses:  Active Problems:   Stage IV adenocarcinoma of pancreas (HCC)   COPD (chronic obstructive pulmonary disease) (HCC)   Diabetes mellitus (HCC)   Small bowel perforation s/p SB resection 07/11/2016   Abdominal pain   Leukocytosis   Esophagitis   Reflux gastritis   Gastroparesis   Hypokalemia   Discharge Condition: Stable and improved.  Discharge home with instruction to follow-up with PCP in 10 days, to resume prior scheduled visits with his oncologist and to follow-up with GI in 4 weeks.  Diet recommendation: Heart healthy diet and modify carbohydrates.  Filed Weights   01/04/18 1049 01/04/18 2257  Weight: 88.5 kg (195 lb) 85 kg (187 lb 6.3 oz)    History of present illness:  As per H&P written by Dr. Darrick Meigs on 01/04/2018. 77 y.o. 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  Patient is currently getting chemotherapy for pancreatic cancer, last chemo was on Tuesday after that patient became nauseated and had abdominal pain.  He also received injection of Neulasta 2 days ago.  Patient was seen by oncologist 3 days ago for nausea and was given IV fluids along with steroid shot.  This gave him temporary relief but symptoms have returned.  He denies diarrhea.  Complains of retching but no vomiting complains of abdominal pain. He denies dysuria, urgency or frequency of urination. Denies chest pain or shortness of breath. In the ED, lab work showed WBC 61,000, patient is not septic.  Does not  have any source of infection.  Hospital Course:  1. Intractable nausea and vomiting with epigastric abdominal pain likely secondary to duodenitis.  EGD r/o stenosis and demonstrated severe esophagitis and duodenitis; most likely associated with reflux and chemotherapy. Will continue PPI twice a day now (using protonix), continue Reglan TID before main meals (at least for 2 weeks) and follow small multiple meals. 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 2. Severe leukocytosis-improving.  There is some initial thought that perhaps steroids and Neulasta may have contributed to this process but Dr. Manuella Ghazi spoken with Dr. Tressie Stalker his oncologist who states that he has never had a white cell count this elevated. Patient afebrile, non-toxic and w/o source of infection. Empirically treated with 3 days of zosyn; but discontinued at discharge. WBC's 4.8 3. 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  These are not new per CT scan as comparison was in 2017 and he has had multiple repeat CT scans in 2018.  Currently on chemoradiation treatments at [facility].continue outpatient follow up with oncologist.  4. Type 2 diabetes with hyperglycemia. resume home oral hypoglycemic regimen. Instructed to follow low carb diet.  5. Thrombocytopenia-has remained low but stable; most likely associated with chemotherapy. No heparin products used during admission; follow trend at follow up visit.   6.   BPH: will continue flomax.  7.   Diarrhea: appears to be non infectious in origin. Antibiotics discontinued; discharge on florastor and instructed by GI to use PRN over the counter imodium. Patient instructed to keep himself well hydrated. 8.   Hypokalemia: Associated with GI  losses.  Replete and patient discharged home on potassium supplementation.  Repeat basic metabolic panel during next visit.   Procedures:  EGD  Consultations:  Gastroenterology service.  Discharge  Exam: Vitals:   01/08/18 2126 01/09/18 0631  BP: 118/73 127/80  Pulse: 74 77  Resp:    Temp: 98.5 F (36.9 C) 98.4 F (36.9 C)  SpO2: 95% 95%    General exam: Alert, awake, oriented x 3; denies CP and SOB.  Reports no further diarrhea and in fact stools forming up.  No further nausea or vomiting reported.   Tolerating soft diet. Respiratory system: Clear to auscultation. Respiratory effort normal. Cardiovascular system:RRR. No murmurs, rubs, gallops. Gastrointestinal system: Abdomen is nondistended, soft and nontender on palpation. No organomegaly or masses felt. Normal bowel sounds heard. Central nervous system: Alert and oriented. No focal neurological deficits. Extremities: No C/C/E, +pedal pulses Skin: No rashes, lesions or ulcers Psychiatry: Judgement and insight appear normal. Mood & affect appropriate.    Discharge Instructions   Discharge Instructions    Diet - low sodium heart healthy   Complete by:  As directed    Discharge instructions   Complete by:  As directed    Medications as prescribed Keep yourself well-hydrated Follow soft diet (remember multiple small meals throughout the day). Reglan 3 times a day as is close and Protonix twice a day. Stop the use of NSAIDs Okay to resume Eliquis. Follow up with PCP in 10 days Follow up with Dr. Laural Golden as instructed     Allergies as of 01/09/2018   No Known Allergies     Medication List    STOP taking these medications   naproxen sodium 220 MG tablet Commonly known as:  ALEVE   omeprazole 20 MG capsule Commonly known as:  PRILOSEC Replaced by:  pantoprazole 40 MG tablet     TAKE these medications   dexamethasone 4 MG tablet Commonly known as:  DECADRON Take 1 tablet by mouth 2 (two) times daily.   ELIQUIS 2.5 MG Tabs tablet Generic drug:  apixaban Take 1 tablet by mouth 2 (two) times daily.   gabapentin 600 MG tablet Commonly known as:  NEURONTIN Take 600 mg by mouth 2 (two) times daily.    glipiZIDE 10 MG 24 hr tablet Commonly known as:  GLUCOTROL XL Take 10 mg by mouth 2 (two) times daily.   levothyroxine 200 MCG tablet Commonly known as:  SYNTHROID, LEVOTHROID Take 200 mcg by mouth daily before breakfast.   LORazepam 1 MG tablet Commonly known as:  ATIVAN Take 1 mg by mouth every 3 (three) hours.   metoCLOPramide 5 MG tablet Commonly known as:  REGLAN Take 1 tablet (5 mg total) by mouth 3 (three) times daily before meals for 14 days.   nitroGLYCERIN 0.4 MG SL tablet Commonly known as:  NITROSTAT Place 0.4 mg under the tongue as needed for chest pain.   OXYCONTIN 10 mg 12 hr tablet Generic drug:  oxyCODONE Take 1 tablet by mouth 2 (two) times daily as needed.   pantoprazole 40 MG tablet Commonly known as:  PROTONIX Take 1 tablet (40 mg total) by mouth 2 (two) times daily. Replaces:  omeprazole 20 MG capsule   potassium chloride 10 MEQ tablet Commonly known as:  K-DUR Take 2 tablets (20 mEq total) by mouth 2 (two) times daily. What changed:  how much to take   saccharomyces boulardii 250 MG capsule Commonly known as:  FLORASTOR Take 1 capsule (250 mg total) by mouth 2 (  two) times daily.   tamsulosin 0.4 MG Caps capsule Commonly known as:  FLOMAX Take 0.4 mg by mouth every morning.      No Known Allergies Follow-up Information    [name], MD. Schedule an appointment as soon as possible for a visit in 10 day(s).   Specialty:  Internal Medicine Contact information: [address] [place] [address] [phone]        Rogene Houston, MD. Schedule an appointment as soon as possible for a visit in 4 week(s).   Specialty:  Gastroenterology Contact information: [address], [address] [place] [address] [phone]           The results of significant diagnostics from this hospitalization (including imaging, microbiology, ancillary and laboratory) are listed below for reference.    Significant Diagnostic Studies: Ct  Abdomen Pelvis W Wo Contrast  Result Date: 01/05/2018 CLINICAL DATA:  Pancreatic carcinoma with lung metastasis peritoneal metastasis. Small-bowel resection 2014. Ongoing chemotherapy. Nausea and abdominal pain . EXAM: CT ABDOMEN AND PELVIS WITHOUT AND WITH CONTRAST TECHNIQUE: Multidetector CT imaging of the abdomen and pelvis was performed following the standard protocol before and following the bolus administration of intravenous contrast. CONTRAST:  161mL ISOVUE-300 IOPAMIDOL (ISOVUE-300) INJECTION 61% COMPARISON:  CT 07/17/2016 FINDINGS: Lower chest: 11 mm nodule in the RIGHT lower lobe (image 20/9) is not clearly identified on comparison exam 07/10/2016 small lingular nodule on image 6/9 measures 3 mm not localized on prior. RIGHT middle lobe nodule anterior to the fissure on image 6/9 measures 6 mm also not clearly seen on prior. Hepatobiliary: Unfortunately, there is a new lesion in the RIGHT hepatic lobe (segment 5) measuring 2.3 x 1.7 cm. Second subcapsular nodule along the posterior margin of the RIGHT hepatic lobe (segment 5) is also new. Two small lesions in the dome liver measure less than 1 cm (image 13/4) and are also new. Gallbladder normal.  No biliary duct dilatation Pancreas: Head of the pancreas is normal. No biliary duct dilatation. Spleen: Normal spleen Adrenals/urinary tract: Adrenal glands normal. Benign-appearing cysts of the kidneys ureters and bladder normal Stomach/Bowel: Stomach is distended with fluid. There is some constriction of the pylorus which may be physiologic (image 56/4). There is bowel wall edema along the second portion the duodenum and third portion duodenum (image 73/4) which is new from prior. Small bowel appendix and cecum normal. There is mild bowel wall thickening along the transverse colon (image 76/4). There is bowel wall thickening in the rectum (image 128/4.) Large diverticulum along the anterior margin of the rectum (image 72/15) Within the central mesentery of  the small bowel there is angular nodule with peripheral enhancement measuring 2 cm x 2 cm (image 89/4) which is new from comparison exam. Smaller peritoneal metastasis in the LEFT upper quadrant adjacent to proximal small bowel (image 72/4. Vascular/Lymphatic: Abdominal aorta is normal caliber. No periportal or retroperitoneal adenopathy. No pelvic adenopathy. Reproductive: Prostate normal Other: No intraperitoneal free fluid Musculoskeletal: Hemangioma within the L1 and L2 vertebral bodies as well as the T11 vertebral body. IMPRESSION: 1. New pulmonary nodules at the lung bases most consistent with metastatic pulmonary nodules. 2. At least 4 new lesions within the RIGHT hepatic lobe consistent with hepatic metastasis. 3. New enhancing nodules within the central mesentery consistent with peritoneal metastasis. Additionally, there is mural thickening /edema within the second portion duodenum, transverse colon, and rectum. No measurable mass lesion through these regions but findings are concerning for serosal metastasis. 4. Edema through the  duodenum may have a component of duodenitis. Thickening through the pyloric region may be physiologic but cannot exclude serosal metastasis. Electronically Signed   By: Suzy Bouchard M.D.   On: 01/05/2018 12:30   Dg Chest Port 1 View  Result Date: 01/05/2018 CLINICAL DATA:  Nasogastric tube placement. EXAM: PORTABLE CHEST 1 VIEW COMPARISON:  Chest radiograph July 24, 2016 FINDINGS: Nasogastric tube past GE junction, tip out of field of view. Single-lumen LEFT chest Port-A-Cath tip projecting in the superior vena cava unchanged. Cardiac silhouette is mildly enlarged. Mediastinal silhouette is suspicious. Bibasilar strandy densities. Small RIGHT pleural effusion. No pneumothorax. Soft tissue included osseous are non suspicious. IMPRESSION: Nasogastric tube past GE junction region, distal tip out of field of view. Small RIGHT pleural effusion.  Bibasilar atelectasis.  Electronically Signed   By: Elon Alas M.D.   On: 01/05/2018 14:34    Microbiology: Recent Results (from the past 240 hour(s))  Gastrointestinal Panel by PCR , Stool     Status: None   Collection Time: 01/07/18 11:29 AM  Result Value Ref Range Status   Campylobacter species NOT DETECTED NOT DETECTED Final   Plesimonas shigelloides NOT DETECTED NOT DETECTED Final   Salmonella species NOT DETECTED NOT DETECTED Final   Yersinia enterocolitica NOT DETECTED NOT DETECTED Final   Vibrio species NOT DETECTED NOT DETECTED Final   Vibrio cholerae NOT DETECTED NOT DETECTED Final   Enteroaggregative E coli (EAEC) NOT DETECTED NOT DETECTED Final   Enteropathogenic E coli (EPEC) NOT DETECTED NOT DETECTED Final   Enterotoxigenic E coli (ETEC) NOT DETECTED NOT DETECTED Final   Shiga like toxin producing E coli (STEC) NOT DETECTED NOT DETECTED Final   Shigella/Enteroinvasive E coli (EIEC) NOT DETECTED NOT DETECTED Final   Cryptosporidium NOT DETECTED NOT DETECTED Final   Cyclospora cayetanensis NOT DETECTED NOT DETECTED Final   Entamoeba histolytica NOT DETECTED NOT DETECTED Final   Giardia lamblia NOT DETECTED NOT DETECTED Final   Adenovirus F40/41 NOT DETECTED NOT DETECTED Final   Astrovirus NOT DETECTED NOT DETECTED Final   Norovirus GI/GII NOT DETECTED NOT DETECTED Final   Rotavirus A NOT DETECTED NOT DETECTED Final   Sapovirus (I, II, IV, and V) NOT DETECTED NOT DETECTED Final    Comment: Performed at [facility], [address] [place], [place] [address]  C difficile quick scan w PCR reflex     Status: None   Collection Time: 01/07/18  7:30 PM  Result Value Ref Range Status   C Diff antigen NEGATIVE NEGATIVE Final   C Diff toxin NEGATIVE NEGATIVE Final   C Diff interpretation No C. difficile detected.  Final    Comment: Performed at [facility], [address] [place], [place] [address]     Labs: Basic Metabolic Panel: Recent Labs  Lab 01/04/18 1225 01/05/18 0426  01/06/18 0721 01/07/18 0728 01/09/18 0456  NA 131* 132* 133* 133* 134*  K 3.7 3.6 3.9 3.5 3.0*  CL 95* 98* 101 103 105  CO2 27 26 23 24 23   GLUCOSE 192* 177* 211* 156* 152*  BUN 25* 19 19 17 6   CREATININE 0.94 0.89 0.99 1.01 0.90  CALCIUM 8.4* 7.9* 8.0* 7.8* 7.5*   Liver Function Tests: Recent Labs  Lab 01/04/18 1225 01/05/18 0426 01/06/18 0721  AST 26 19 15   ALT 29 25 23   ALKPHOS 126 133* 153*  BILITOT 0.8 0.8 0.8  PROT 5.8* 5.3* 5.4*  ALBUMIN 2.9* 2.7* 2.7*   CBC: Recent Labs  Lab 01/04/18 1225 01/05/18  2890 01/06/18 0721 01/07/18 0728 01/08/18 0821 01/09/18 0456  WBC 61.8* 51.2* 40.8* 11.5* 7.1 4.8  NEUTROABS 60.0  --   --  9.4* 5.7  --   HGB 10.6* 10.2* 10.4* 9.1* 9.0* 8.5*  HCT 32.6* 30.9* 31.7* 27.5* 27.5* 26.2*  MCV 93.7 94.0 92.7 93.9 93.5 93.9  PLT 88* 71* 53* 34* 39* 48*    CBG: Recent Labs  Lab 01/07/18 1200 01/07/18 1602 01/07/18 2030 01/08/18 0028 01/08/18 0543  GLUCAP 150* 140* 167* 116* 125*    Signed:  Barton Dubois MD.  [facility] 01/09/2018, 4:31 PM

## 2018-02-06 ENCOUNTER — Other Ambulatory Visit: Payer: Self-pay

## 2018-02-06 ENCOUNTER — Emergency Department (HOSPITAL_COMMUNITY): Payer: Medicare Other

## 2018-02-06 ENCOUNTER — Encounter (HOSPITAL_COMMUNITY): Payer: Self-pay

## 2018-02-06 ENCOUNTER — Emergency Department (HOSPITAL_COMMUNITY)
Admission: EM | Admit: 2018-02-06 | Discharge: 2018-02-06 | Disposition: A | Discharge status: Home / Self Care | Payer: Medicare Other | Attending: Emergency Medicine | Admitting: Emergency Medicine

## 2018-02-06 DIAGNOSIS — R091 Pleurisy: Secondary | ICD-10-CM | POA: Insufficient documentation

## 2018-02-06 DIAGNOSIS — Z8507 Personal history of malignant neoplasm of pancreas: Secondary | ICD-10-CM | POA: Diagnosis not present

## 2018-02-06 DIAGNOSIS — Z87891 Personal history of nicotine dependence: Secondary | ICD-10-CM | POA: Insufficient documentation

## 2018-02-06 DIAGNOSIS — Z7901 Long term (current) use of anticoagulants: Secondary | ICD-10-CM | POA: Diagnosis not present

## 2018-02-06 DIAGNOSIS — E039 Hypothyroidism, unspecified: Secondary | ICD-10-CM | POA: Insufficient documentation

## 2018-02-06 DIAGNOSIS — J449 Chronic obstructive pulmonary disease, unspecified: Secondary | ICD-10-CM | POA: Diagnosis not present

## 2018-02-06 DIAGNOSIS — E119 Type 2 diabetes mellitus without complications: Secondary | ICD-10-CM | POA: Diagnosis not present

## 2018-02-06 DIAGNOSIS — Z79899 Other long term (current) drug therapy: Secondary | ICD-10-CM | POA: Diagnosis not present

## 2018-02-06 DIAGNOSIS — R079 Chest pain, unspecified: Secondary | ICD-10-CM | POA: Diagnosis present

## 2018-02-06 LAB — COMPREHENSIVE METABOLIC PANEL
ALBUMIN: 2.2 g/dL — AB (ref 3.5–5.0)
ALT: 11 U/L (ref 0–44)
AST: 21 U/L (ref 15–41)
Alkaline Phosphatase: 250 U/L — ABNORMAL HIGH (ref 38–126)
Anion gap: 8 (ref 5–15)
BUN: 22 mg/dL (ref 8–23)
CHLORIDE: 98 mmol/L (ref 98–111)
CO2: 25 mmol/L (ref 22–32)
Calcium: 8.3 mg/dL — ABNORMAL LOW (ref 8.9–10.3)
Creatinine, Ser: 1.18 mg/dL (ref 0.61–1.24)
GFR calc Af Amer: >60 mL/min (ref >60)
GFR calc non Af Amer: 58 mL/min — ABNORMAL LOW (ref >60)
GLUCOSE: 179 mg/dL — AB (ref 70–99)
POTASSIUM: 5 mmol/L (ref 3.5–5.1)
SODIUM: 131 mmol/L — AB (ref 135–145)
Total Bilirubin: 0.5 mg/dL (ref 0.3–1.2)
Total Protein: 6.5 g/dL (ref 6.5–8.1)

## 2018-02-06 LAB — CBC WITH DIFFERENTIAL/PLATELET
BASOS PCT: 0 %
Basophils Absolute: 0 10*3/uL (ref 0.0–0.1)
EOS PCT: 0 %
Eosinophils Absolute: 0 10*3/uL (ref 0.0–0.7)
HCT: 29.1 % — ABNORMAL LOW (ref 39.0–52.0)
Hemoglobin: 9.1 g/dL — ABNORMAL LOW (ref 13.0–17.0)
Lymphocytes Relative: 7 %
Lymphs Abs: 0.9 10*3/uL (ref 0.7–4.0)
MCH: 30.8 pg (ref 26.0–34.0)
MCHC: 31.3 g/dL (ref 30.0–36.0)
MCV: 98.6 fL (ref 78.0–100.0)
MONO ABS: 1.1 10*3/uL — AB (ref 0.1–1.0)
Monocytes Relative: 8 %
Neutro Abs: 11.3 10*3/uL — ABNORMAL HIGH (ref 1.7–7.7)
Neutrophils Relative %: 85 %
PLATELETS: 283 10*3/uL (ref 150–400)
RBC: 2.95 MIL/uL — ABNORMAL LOW (ref 4.22–5.81)
RDW: 20.9 % — AB (ref 11.5–15.5)
WBC: 13.3 10*3/uL — ABNORMAL HIGH (ref 4.0–10.5)

## 2018-02-06 LAB — PROTIME-INR
INR: 1.43
PROTHROMBIN TIME: 17.3 s — AB (ref 11.4–15.2)

## 2018-02-06 LAB — TROPONIN I

## 2018-02-06 MED ORDER — ASPIRIN 81 MG PO CHEW
324.0000 mg | CHEWABLE_TABLET | Freq: Once | ORAL | Status: AC
  Administered 2018-02-06: 324 mg via ORAL
  Filled 2018-02-06: qty 4

## 2018-02-06 MED ORDER — IOPAMIDOL (ISOVUE-370) INJECTION 76%
100.0000 mL | Freq: Once | INTRAVENOUS | Status: AC | PRN
  Administered 2018-02-06: 100 mL via INTRAVENOUS

## 2018-02-06 MED ORDER — HEPARIN SOD (PORK) LOCK FLUSH 100 UNIT/ML IV SOLN
INTRAVENOUS | Status: AC
  Administered 2018-02-06: 15:00:00
  Filled 2018-02-06: qty 5

## 2018-02-06 MED ORDER — MELOXICAM 7.5 MG PO TABS: 15.0000 mg | ORAL_TABLET | Freq: Every day | ORAL | 0 refills | Status: AC

## 2018-02-06 NOTE — Discharge Instructions (Signed)
Mobic twice daily for 7 days See your doctor for recheck in 3 days if still having pain ER for worsening symptoms, Drink plenty of fluids Share the results of the tests that I gave you with your doctor.

## 2018-02-06 NOTE — ED Triage Notes (Signed)
Pt reports sudden onset of pain around his portacath.  Reports has had port x 4 years.  Pt took 1 nitro without relief.  Reports nitro were 77 years old.  Denies any n/v or SOB.  HR 106 with occasional PVC.  Pt says area is not sore to palpate but gets worse with inspiration.  Reports pain is intermittent.  EMS administered 1 nitro and 4 baby asa.  Pt says pain has become less frequent since the nitro.  Pt has pancreatic cancer and started a chemo pill and today at 7am  was his 3rd dose.  EMS started 20g IV in left wrist.

## 2018-02-06 NOTE — ED Provider Notes (Signed)
[facility] EMERGENCY DEPARTMENT Provider Note   CSN: [id number] Arrival date & time: 02/06/18  1045     History   Chief Complaint Chief Complaint  Patient presents with  . Chest Pain    HPI Albert Richards is a 77 y.o. male.  HPI  77 year old male, known history of COPD as well as a history of pancreatic cancer, pneumonia and diabetes.  He has a port in his left upper chest for his pancreatic cancer, actively being used for chemotherapy.  He is also recently started some oral chemotherapy.  He states yesterday was totally normal, he woke up this morning with pain in the left side of his chest, pinpoint, sharp and stabbing, worse with taking a breath, not associated with coughing shortness of breath fevers.  He does have a known blood clot in his leg for about 6 months ago, he is currently taking an anticoagulant, Eliquis.  He denies any bleeding, denies any fevers, the symptoms are intermittent and worse with taking a deep breath.  Past Medical History:  Diagnosis Date  . Arthritis   . Bowel obstruction ([place])   . 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 Diabetes mellitus without complication ([place])   . History of kidney stones 10-12 yrs ago  . Hypothyroidism   . pancreatic ca dx'd 2014  . Pneumonia   . Rocky Mountain spotted fever     Patient Active Problem List   Diagnosis Date Noted  . Leukocytosis   . Esophagitis   . Reflux gastritis   . Gastroparesis   . Hypokalemia   . Abdominal pain 01/04/2018  . Small bowel perforation s/p SB resection 07/11/2016 07/11/2016  . Nausea vomiting and diarrhea   . SBO (small bowel obstruction) ([place]) 05/13/2016  . Diabetes mellitus ([place]) 05/13/2016  . Hypothyroid 05/13/2016  . Atherosclerosis of aorta ([place]) 07/07/2013  . Stage IV adenocarcinoma of pancreas ([place]) 06/30/2013  . COPD (chronic obstructive pulmonary disease) ([place]) 06/30/2013    Past Surgical History:  Procedure Laterality Date  .  AMPUTATION Left 01/08/2016   Procedure: AMPUTATION REVISION LEFT FIRST FINGER ;  Surgeon: Charlotte Crumb, MD;  Location: [place];  Service: Orthopedics;  Laterality: Left;  . ESOPHAGOGASTRODUODENOSCOPY N/A 01/07/2018   Procedure: ESOPHAGOGASTRODUODENOSCOPY (EGD);  Surgeon: Rogene Houston, MD;  [facility];  Service: Endoscopy;  Laterality: N/A;  . EUS N/A 07/08/2013   Procedure: UPPER ENDOSCOPIC ULTRASOUND (EUS) LINEAR;  Surgeon: Milus Banister, MD;  [facility];  Service: Endoscopy;  Laterality: N/A;  . I&D EXTREMITY Left 01/08/2016   Procedure: IRRIGATION AND DEBRIDEMENT LEFT THUMB;  Surgeon: Charlotte Crumb, MD;  Location: [place];  Service: Orthopedics;  Laterality: Left;  . LAPAROTOMY N/A 07/11/2016   Procedure: EXPLORATORY LAPAROTOMY;  Surgeon: Autumn Messing III, MD;  [facility];  Service: General;  Laterality: N/A;  . LAPAROTOMY N/A 07/17/2016   Procedure: EXPLORATORY LAPAROTOMY, EVACUATION OF HEMATOMA;  Surgeon: Mickeal Skinner, MD;  [facility];  Service: General;  Laterality: N/A;  . PORTACATH PLACEMENT N/A 07/30/2013   Procedure: INSERTION PORT-A-CATH;  Surgeon: Stark Klein, MD;  Location: [place];  Service: General;  Laterality: N/A;  . TONSILLECTOMY  as child        Home Medications    Prior to Admission medications   Medication Sig Start Date End Date Taking? Authorizing Provider  apixaban (ELIQUIS) 2.5 MG TABS tablet Take 1 tablet by mouth 2 (two) times daily. 11/24/17  Yes [provider]  glipiZIDE (GLUCOTROL XL) 10 MG 24 hr tablet Take 10 mg by mouth 2 (two) times daily.  11/07/15  Yes [provider]  levothyroxine (SYNTHROID, LEVOTHROID) 200 MCG tablet Take 200 mcg by mouth daily before breakfast.   Yes [provider]  LORazepam (ATIVAN) 1 MG tablet Take 1 mg by mouth every 3 (three) hours.   Yes [provider]  metoCLOPramide (REGLAN) 5 MG tablet Take 1 tablet (5 mg total) by mouth 3 (three) times  daily before meals for 14 days. 01/09/18 02/06/18 Yes Barton Dubois, MD  nitroGLYCERIN (NITROSTAT) 0.4 MG SL tablet Place 0.4 mg under the tongue as needed for chest pain.  03/09/15  Yes [provider]  oxyCODONE (OXYCONTIN) 10 mg 12 hr tablet Take 1 tablet by mouth 2 (two) times daily as needed. 12/30/17 02/28/18 Yes [provider]  oxyCODONE-acetaminophen (PERCOCET/ROXICET) 5-325 MG tablet Take 1 tablet by mouth every 4 (four) hours as needed for moderate pain.  02/03/18  Yes [provider]  pantoprazole (PROTONIX) 40 MG tablet Take 1 tablet (40 mg total) by mouth 2 (two) times daily. 01/09/18  Yes Barton Dubois, MD  potassium chloride (K-DUR) 10 MEQ tablet Take 2 tablets (20 mEq total) by mouth 2 (two) times daily. 01/09/18  Yes Barton Dubois, MD  predniSONE (DELTASONE) 5 MG tablet Take 5 mg by mouth daily with breakfast.   Yes [provider]  saccharomyces boulardii (FLORASTOR) 250 MG capsule Take 1 capsule (250 mg total) by mouth 2 (two) times daily. 01/09/18  Yes Barton Dubois, MD  Tamsulosin HCl (FLOMAX) 0.4 MG CAPS Take 0.4 mg by mouth every morning.   Yes [provider]  trametinib dimethyl sulfoxide (MEKINIST) 2 MG tablet Take 2 mg by mouth daily. Take 1 hour before or 2 hours after a meal. Store refrigerated in original container.   Yes [provider]  gabapentin (NEURONTIN) 600 MG tablet Take 600 mg by mouth 2 (two) times daily. 01/10/16   [provider]  meloxicam (MOBIC) 7.5 MG tablet Take 2 tablets (15 mg total) by mouth daily. 02/06/18   Noemi Chapel, MD    Family History No family history on file.  Social History Social History   Tobacco Use  . Smoking status: Former Smoker    Packs/day: 1.00    Years: 50.00    Pack years: 50.00    Types: Cigarettes    Start date: 07/30/1955    Last attempt to quit: 07/29/2005    Years since quitting: 12.5  . Smokeless tobacco: Never Used  Substance Use Topics  . Alcohol use: No    . Drug use: No     Allergies   Hydromorphone   Review of Systems Review of Systems  All other systems reviewed and are negative.    Physical Exam Updated Vital Signs BP 120/87   Pulse 74   Temp 98.3 F (36.8 C) (Oral)   Resp 20   Ht 5\' 10"  (1.778 m)   Wt 80.7 kg (178 lb)   SpO2 91%   BMI 25.54 kg/m   Physical Exam  Constitutional: He appears well-developed and well-nourished. No distress.  HENT:  Head: Normocephalic and atraumatic.  Mouth/Throat: Oropharynx is clear and moist. No oropharyngeal exudate.  Eyes: Pupils are equal, round, and reactive to light. Conjunctivae and EOM are normal. Right eye exhibits no discharge. Left eye exhibits no discharge. No scleral icterus.  Neck: Normal range of motion. Neck supple. No JVD present. No thyromegaly  present.  Cardiovascular: Normal rate, regular rhythm, normal heart sounds and intact distal pulses. Exam reveals no gallop and no friction rub.  No murmur heard. No tenderness over the chest wall, Port-A-Cath in the left upper chest is intact, no overlying redness or tenderness  Pulmonary/Chest: Effort normal and breath sounds normal. No respiratory distress. He has no wheezes. He has no rales.  Lungs are clear to auscultation, he does have the inability to take a deep breath secondary to pain  Abdominal: Soft. Bowel sounds are normal. He exhibits no distension and no mass. There is no tenderness.  Musculoskeletal: Normal range of motion. He exhibits edema. He exhibits no tenderness.  Very slight edema of the right leg compared to the left, slight asymmetry  Lymphadenopathy:    He has no cervical adenopathy.  Neurological: He is alert. Coordination normal.  Skin: Skin is warm and dry. No rash noted. No erythema.  Psychiatric: He has a normal mood and affect. His behavior is normal.  Nursing note and vitals reviewed.    ED Treatments / Results  Labs (all labs ordered are listed, but only abnormal results are  displayed) Labs Reviewed  PROTIME-INR - Abnormal; Notable for the following components:      Result Value   Prothrombin Time 17.3 (*)    All other components within normal limits  CBC WITH DIFFERENTIAL/PLATELET - Abnormal; Notable for the following components:   WBC 13.3 (*)    RBC 2.95 (*)    Hemoglobin 9.1 (*)    HCT 29.1 (*)    RDW 20.9 (*)    Neutro Abs 11.3 (*)    Monocytes Absolute 1.1 (*)    All other components within normal limits  COMPREHENSIVE METABOLIC PANEL - Abnormal; Notable for the following components:   Sodium 131 (*)    Glucose, Bld 179 (*)    Calcium 8.3 (*)    Albumin 2.2 (*)    Alkaline Phosphatase 250 (*)    GFR calc non Af Amer 58 (*)    All other components within normal limits  TROPONIN I    EKG EKG Interpretation  Date/Time:  Friday February 06 2018 11:01:48 EDT Ventricular Rate:  93 PR Interval:    QRS Duration: 93 QT Interval:  324 QTC Calculation: 403 R Axis:   62 Text Interpretation:  Sinus rhythm Ventricular premature complex Borderline T abnormalities, inferior leads Since last tracing ST and T waves have improved significantly no ST elevation or depressions, PVC present Confirmed by Noemi Chapel [phone]) on 02/06/2018 11:10:04 AM   Radiology Dg Chest 2 View  Result Date: 02/06/2018 CLINICAL DATA:  Patient with pleuritic chest pain. EXAM: CHEST - 2 VIEW COMPARISON:  Chest radiograph 01/05/2018. FINDINGS: Left anterior chest wall Port-A-Cath is present with tip projecting over the superior vena cava. Stable cardiomegaly. Tortuosity of the thoracic aorta. Right-greater-than-left basilar heterogeneous pulmonary opacities. Small right pleural effusion. Thoracic spine degenerative changes. IMPRESSION: Right-greater-than-left basilar heterogeneous opacities may represent atelectasis or infection. Small right pleural effusion. Electronically Signed   By: Lovey Newcomer M.D.   On: 02/06/2018 12:15   Ct Angio Chest Pe W And/or Wo Contrast  Addendum Date:  02/06/2018   ADDENDUM REPORT: 02/06/2018 14:26 ADDENDUM: Not mentioned in the impression, but included in the report body. There is a new 2.8 cm liver lesion consistent with metastatic disease. Spleen is larger than on the prior study, now mildly enlarged. The etiology of this is unclear. Electronically Signed   By: Dedra Skeens.D.  On: 02/06/2018 14:26   Result Date: 02/06/2018 CLINICAL DATA:  Chest pain and shortness of breath since this morning. History of pancreatic carcinoma diagnosed in 2014. EXAM: CT ANGIOGRAPHY CHEST WITH CONTRAST TECHNIQUE: Multidetector CT imaging of the chest was performed using the standard protocol during bolus administration of intravenous contrast. Multiplanar CT image reconstructions and MIPs were obtained to evaluate the vascular anatomy. CONTRAST:  134mL ISOVUE-370 IOPAMIDOL (ISOVUE-370) INJECTION 76% COMPARISON:  Current chest radiograph.  Chest CT, 04/22/2016. FINDINGS: Cardiovascular: Satisfactory opacification of the pulmonary arteries to the segmental level. No evidence of pulmonary embolism. Normal heart size. No pericardial effusion. Mild 2 vessel coronary artery atherosclerotic disease. Aorta is normal in caliber. No dissection. No significant atherosclerosis. Mediastinum/Nodes: No enlarged mediastinal, hilar, or axillary lymph nodes. Thyroid gland, trachea, and esophagus demonstrate no significant findings. Lungs/Pleura: Small right pleural effusion. There are numerous new bilateral pulmonary nodules. Largest discrete nodule on the right is centered in the right upper lobe, image 55, series 6, measuring 19 x 15 mm transversely. Largest discrete nodule in the left is subpleural, left lower lobe, image 82, measuring 12 mm. This nodule measured 9 mm on the prior CT. There is confluent type opacity in the right lower lobe consistent with a combination of ill-defined pulmonary nodules and consolidation versus atelectasis. Largest discrete area of confluent opacity no  evidence of pulmonary edema. No pneumothorax. Lies in the posterior right lower lobe measuring approximate 5.3 x 3.9 cm transversely. Upper Abdomen: Hypoattenuating lesion lies in the lateral right liver lobe, segment 6, measuring 2.8 cm, new from the prior exam. Spleen is lobulated and mildly enlarged measuring approximately 14.5 x 5.9 x 11.1 cm, increased from the prior study. Musculoskeletal: No fracture or acute finding. No osteoblastic or osteolytic lesions. Review of the MIP images confirms the above findings. IMPRESSION: 1. No evidence of a pulmonary embolism. 2. There has been significant worsening of metastatic disease to the lungs. There are numerous new pulmonary nodules, largest in the right upper lobe measuring 19 x 15 mm transversely, new since the prior CT. There is a masslike area opacity in the posterior right lower lobe measuring approximately 5.3 x 3.9 cm which may reflect metastatic disease both more likely combination of metastatic disease and atelectasis or postobstructive consolidation. 3. Cannot exclude superimposed right lower lobe pneumonia if this correlates clinically. 4. Small right pleural effusion. Electronically Signed: By: Lajean Manes M.D. On: 02/06/2018 14:02    Procedures Procedures (including critical care time)  Medications Ordered in ED Medications  aspirin chewable tablet 324 mg (324 mg Oral Given 02/06/18 1125)  iopamidol (ISOVUE-370) 76 % injection 100 mL (100 mLs Intravenous Contrast Given 02/06/18 1324)     Initial Impression / Assessment and Plan / ED Course  I have reviewed the triage vital signs and the nursing notes.  Pertinent labs & imaging results that were available during my care of the patient were reviewed by me and considered in my medical decision making (see chart for details).    EKG is improved compared to prior, will need a chest x-ray to rule out pneumothorax, he has pleuritic pain and with a history of DVT it does raise concern for  pulmonary embolism.  May need CT scan if no other etiology is found.  He is already anticoagulated however if he has a pulmonary embolism it would change his need for further evaluation including echocardiogram.  CT shared with pt Pleurisy likely - no R sided pain - has multiple masses seen on the  CT scan consistent with metastatic disease.  He does not have a cough or a fever to suggest that he has a postobstructive pneumonia in the right lower lobe.  No PE, no pneumothorax  Patient agreeable to follow-up in the outpatient setting.  Mobic twice a day for 7 days for an anti-inflammatory.  Patient agreeable  Final Clinical Impressions(s) / ED Diagnoses   Final diagnoses:  Pleurisy    ED Discharge Orders        Ordered    meloxicam (MOBIC) 7.5 MG tablet  Daily     02/06/18 1443       Noemi Chapel, MD 02/06/18 1446

## 2018-02-09 ENCOUNTER — Encounter (INDEPENDENT_AMBULATORY_CARE_PROVIDER_SITE_OTHER): Payer: Self-pay | Admitting: Internal Medicine

## 2018-02-09 ENCOUNTER — Ambulatory Visit (INDEPENDENT_AMBULATORY_CARE_PROVIDER_SITE_OTHER): Payer: Medicare Other | Admitting: Internal Medicine

## 2018-02-09 VITALS — BP 130/90 | HR 68 | Temp 97.6°F | Ht 70.0 in | Wt 180.1 lb

## 2018-02-09 DIAGNOSIS — C259 Malignant neoplasm of pancreas, unspecified: Secondary | ICD-10-CM

## 2018-02-09 DIAGNOSIS — K279 Peptic ulcer, site unspecified, unspecified as acute or chronic, without hemorrhage or perforation: Secondary | ICD-10-CM

## 2018-02-09 NOTE — Patient Instructions (Addendum)
Continue the Protonix and Probiotic. OV in 6 months.

## 2018-02-09 NOTE — Progress Notes (Signed)
Subjective:    Patient ID: Albert Richards, male    DOB: 08-26-1940, 77 y.o.   MRN: [id number]  HPI Here today for f/u. Underwent an EGD in June of 2019 for abnormal CT of GI tract and N and V. Normal proximal esophagus. LA Grade D reflux esophagitis.Bilious gastric fluid. Normal stomach.  Normal duodenal bulb. Multiple non-bleeding duodenal ulcers with no stigmata of bleeding.  01/07/2018 GI Panel negative.  Presently taking Mekinist 2mg  daily and Prednisone 5 mg daily.  C-diff negative.  Patient has hx of metastatic pancreatic cancer.  Per Dr. Olevia Perches note: Nausea and vomiting secondary to chemotherapy induced small bowel injury documented on EGD.  He says he is doing better. He says he really doesn't feel good. No further nausea or vomiting. He BMs ar sporadic. No diarrhea. Stools are formed. No melena or BRRB.  Occasionally takes MOM for constipation. His appetite is not good. Has lost about 40 pounds since he was diagnosed.    Review of Systems Past Medical History:  Diagnosis Date  . Arthritis   . Bowel obstruction ([place])   . 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 Diabetes mellitus without complication ([place])   . History of kidney stones 10-12 yrs ago  . Hypothyroidism   . pancreatic ca dx'd 2014  . Pneumonia   . Rocky Mountain spotted fever     Past Surgical History:  Procedure Laterality Date  . AMPUTATION Left 01/08/2016   Procedure: AMPUTATION REVISION LEFT FIRST FINGER ;  Surgeon: Charlotte Crumb, MD;  Location: [place];  Service: Orthopedics;  Laterality: Left;  . ESOPHAGOGASTRODUODENOSCOPY N/A 01/07/2018   Procedure: ESOPHAGOGASTRODUODENOSCOPY (EGD);  Surgeon: Rogene Houston, MD;  [facility];  Service: Endoscopy;  Laterality: N/A;  . EUS N/A 07/08/2013   Procedure: UPPER ENDOSCOPIC ULTRASOUND (EUS) LINEAR;  Surgeon: Milus Banister, MD;  [facility];  Service: Endoscopy;  Laterality: N/A;  . I&D EXTREMITY Left  01/08/2016   Procedure: IRRIGATION AND DEBRIDEMENT LEFT THUMB;  Surgeon: Charlotte Crumb, MD;  Location: [place];  Service: Orthopedics;  Laterality: Left;  . LAPAROTOMY N/A 07/11/2016   Procedure: EXPLORATORY LAPAROTOMY;  Surgeon: Autumn Messing III, MD;  [facility];  Service: General;  Laterality: N/A;  . LAPAROTOMY N/A 07/17/2016   Procedure: EXPLORATORY LAPAROTOMY, EVACUATION OF HEMATOMA;  Surgeon: Mickeal Skinner, MD;  [facility];  Service: General;  Laterality: N/A;  . PORTACATH PLACEMENT N/A 07/30/2013   Procedure: INSERTION PORT-A-CATH;  Surgeon: Stark Klein, MD;  Location: [place];  Service: General;  Laterality: N/A;  . TONSILLECTOMY  as child    Allergies  Allergen Reactions  . Hydromorphone Other (See Comments)    Patient states it makes him go crazy    Current Outpatient Medications on File Prior to Visit  Medication Sig Dispense Refill  . apixaban (ELIQUIS) 2.5 MG TABS tablet Take 1 tablet by mouth 2 (two) times daily.    Marland Kitchen levothyroxine (SYNTHROID, LEVOTHROID) 200 MCG tablet Take 200 mcg by mouth daily before breakfast.    . LORazepam (ATIVAN) 1 MG tablet Take 1 mg by mouth every 3 (three) hours.    . meloxicam (MOBIC) 7.5 MG tablet Take 2 tablets (15 mg total) by mouth daily. 30 tablet 0  . nitroGLYCERIN (NITROSTAT) 0.4 MG SL tablet Place 0.4 mg under the tongue as needed for chest pain.     Marland Kitchen oxyCODONE (OXYCONTIN) 10 mg 12 hr tablet Take 1 tablet by mouth  2 (two) times daily as needed.    Marland Kitchen oxyCODONE-acetaminophen (PERCOCET/ROXICET) 5-325 MG tablet Take 1 tablet by mouth every 4 (four) hours as needed for moderate pain.     . pantoprazole (PROTONIX) 40 MG tablet Take 1 tablet (40 mg total) by mouth 2 (two) times daily. 60 tablet 1  . potassium chloride (K-DUR) 10 MEQ tablet Take 2 tablets (20 mEq total) by mouth 2 (two) times daily. 60 tablet 0  . predniSONE (DELTASONE) 5 MG tablet Take 5 mg by mouth daily with breakfast.    . saccharomyces boulardii (FLORASTOR)  250 MG capsule Take 1 capsule (250 mg total) by mouth 2 (two) times daily. 40 capsule 0  . Tamsulosin HCl (FLOMAX) 0.4 MG CAPS Take 0.4 mg by mouth every morning.    . trametinib dimethyl sulfoxide (MEKINIST) 2 MG tablet Take 2 mg by mouth daily. Take 1 hour before or 2 hours after a meal. Store refrigerated in original container.    . gabapentin (NEURONTIN) 600 MG tablet Take 600 mg by mouth 2 (two) times daily.  1  . glipiZIDE (GLUCOTROL XL) 10 MG 24 hr tablet Take 10 mg by mouth 2 (two) times daily.   0  . metoCLOPramide (REGLAN) 5 MG tablet Take 1 tablet (5 mg total) by mouth 3 (three) times daily before meals for 14 days. 60 tablet 0   No current facility-administered medications on file prior to visit.         Objective:   Physical Exam Blood pressure 130/90, pulse 68, temperature 97.6 F (36.4 C), height 5\' 10"  (1.778 m), weight 180 lb 1.6 oz (81.7 kg). Alert and oriented. Skin warm and dry. Oral mucosa is moist.   . Sclera anicteric, conjunctivae is pink. Thyroid not enlarged. No cervical lymphadenopathy. Lungs clear. Heart regular rate and rhythm.  Abdomen is soft. Bowel sounds are positive. No hepatomegaly. No abdominal masses felt. No tenderness.  No edema to lower extremities.  .         Assessment & Plan:  PUD chemo induced. States he does not have any appetite. He says he can go 3 days without eating. Encouraged to force foods. Ensure as needed.  Metastatic pancreatic cancer. OV in 6 months.

## 2018-03-13 ENCOUNTER — Other Ambulatory Visit (HOSPITAL_COMMUNITY)
Admission: AD | Admit: 2018-03-13 | Discharge: 2018-03-13 | Disposition: A | Discharge status: Home / Self Care | Payer: Medicare Other | Source: Skilled Nursing Facility | Attending: Oncology | Admitting: Oncology

## 2018-03-13 DIAGNOSIS — C259 Malignant neoplasm of pancreas, unspecified: Secondary | ICD-10-CM | POA: Diagnosis present

## 2018-03-13 LAB — URINALYSIS, ROUTINE W REFLEX MICROSCOPIC
BILIRUBIN URINE: NEGATIVE
Bacteria, UA: NONE SEEN
GLUCOSE, UA: NEGATIVE mg/dL
Ketones, ur: NEGATIVE mg/dL
NITRITE: NEGATIVE
Protein, ur: NEGATIVE mg/dL
RBC / HPF: >50 RBC/hpf — ABNORMAL HIGH (ref 0–5)
SPECIFIC GRAVITY, URINE: 1.016 (ref 1.005–1.030)
pH: 5 (ref 5.0–8.0)

## 2018-03-14 LAB — URINE CULTURE

## 2018-03-29 DEATH — deceased

## 2018-08-12 ENCOUNTER — Ambulatory Visit (INDEPENDENT_AMBULATORY_CARE_PROVIDER_SITE_OTHER): Payer: Medicare Other | Admitting: Internal Medicine
